# Patient Record
Sex: Male | Born: 1980 | Race: White | Hispanic: No | Marital: Single | Smoking: Current every day smoker
Health system: Southern US, Community
[De-identification: ages and names within clinical notes are randomized; demographics above are authoritative.]

## PROBLEM LIST (undated history)

## (undated) DIAGNOSIS — F32A Depression, unspecified: Secondary | ICD-10-CM

## (undated) DIAGNOSIS — N200 Calculus of kidney: Secondary | ICD-10-CM

## (undated) DIAGNOSIS — B029 Zoster without complications: Secondary | ICD-10-CM

## (undated) DIAGNOSIS — T7840XA Allergy, unspecified, initial encounter: Secondary | ICD-10-CM

## (undated) DIAGNOSIS — F17200 Nicotine dependence, unspecified, uncomplicated: Secondary | ICD-10-CM

## (undated) HISTORY — DX: Depression, unspecified: F32.A

## (undated) HISTORY — DX: Allergy, unspecified, initial encounter: T78.40XA

---

## 2005-03-12 ENCOUNTER — Emergency Department: Payer: Self-pay | Admitting: Emergency Medicine

## 2009-09-12 ENCOUNTER — Emergency Department: Payer: Self-pay | Admitting: Emergency Medicine

## 2011-03-20 ENCOUNTER — Emergency Department: Payer: Self-pay | Admitting: Emergency Medicine

## 2011-04-28 ENCOUNTER — Emergency Department: Payer: Self-pay | Admitting: Emergency Medicine

## 2014-02-27 ENCOUNTER — Emergency Department: Payer: Self-pay | Admitting: Emergency Medicine

## 2014-02-27 LAB — COMPREHENSIVE METABOLIC PANEL
ALBUMIN: 4.2 g/dL (ref 3.4–5.0)
ALT: 31 U/L (ref 12–78)
Alkaline Phosphatase: 64 U/L
Anion Gap: 7 (ref 7–16)
BUN: 13 mg/dL (ref 7–18)
Bilirubin,Total: 0.4 mg/dL (ref 0.2–1.0)
CHLORIDE: 108 mmol/L — AB (ref 98–107)
CO2: 26 mmol/L (ref 21–32)
Calcium, Total: 8.8 mg/dL (ref 8.5–10.1)
Creatinine: 0.74 mg/dL (ref 0.60–1.30)
GLUCOSE: 94 mg/dL (ref 65–99)
Osmolality: 281 (ref 275–301)
POTASSIUM: 4.1 mmol/L (ref 3.5–5.1)
SGOT(AST): 24 U/L (ref 15–37)
SODIUM: 141 mmol/L (ref 136–145)
Total Protein: 6.8 g/dL (ref 6.4–8.2)

## 2014-02-27 LAB — URINALYSIS, COMPLETE
Bilirubin,UR: NEGATIVE
Glucose,UR: NEGATIVE mg/dL (ref 0–75)
Ketone: NEGATIVE
Nitrite: NEGATIVE
Ph: 6 (ref 4.5–8.0)
RBC,UR: 3228 /HPF (ref 0–5)
SQUAMOUS EPITHELIAL: NONE SEEN
Specific Gravity: 1.03 (ref 1.003–1.030)
WBC UR: 4 /HPF (ref 0–5)

## 2014-02-27 LAB — CBC
HCT: 44 % (ref 40.0–52.0)
HGB: 15 g/dL (ref 13.0–18.0)
MCH: 30.6 pg (ref 26.0–34.0)
MCHC: 34.1 g/dL (ref 32.0–36.0)
MCV: 90 fL (ref 80–100)
PLATELETS: 191 10*3/uL (ref 150–440)
RBC: 4.9 10*6/uL (ref 4.40–5.90)
RDW: 12.8 % (ref 11.5–14.5)
WBC: 11.5 10*3/uL — ABNORMAL HIGH (ref 3.8–10.6)

## 2014-04-05 ENCOUNTER — Emergency Department: Payer: Self-pay | Admitting: Emergency Medicine

## 2014-04-05 LAB — COMPREHENSIVE METABOLIC PANEL
Albumin: 4.5 g/dL (ref 3.4–5.0)
Alkaline Phosphatase: 76 U/L
Anion Gap: 4 — ABNORMAL LOW (ref 7–16)
BILIRUBIN TOTAL: 0.5 mg/dL (ref 0.2–1.0)
BUN: 15 mg/dL (ref 7–18)
CALCIUM: 9.3 mg/dL (ref 8.5–10.1)
CO2: 30 mmol/L (ref 21–32)
Chloride: 105 mmol/L (ref 98–107)
Creatinine: 0.78 mg/dL (ref 0.60–1.30)
EGFR (African American): >60
Glucose: 163 mg/dL — ABNORMAL HIGH (ref 65–99)
Osmolality: 282 (ref 275–301)
POTASSIUM: 4.9 mmol/L (ref 3.5–5.1)
SGOT(AST): 12 U/L — ABNORMAL LOW (ref 15–37)
SGPT (ALT): 22 U/L (ref 12–78)
Sodium: 139 mmol/L (ref 136–145)
TOTAL PROTEIN: 7.5 g/dL (ref 6.4–8.2)

## 2014-04-05 LAB — URINALYSIS, COMPLETE
BILIRUBIN, UR: NEGATIVE
Bacteria: NONE SEEN
Glucose,UR: >=500 mg/dL (ref 0–75)
Ketone: NEGATIVE
LEUKOCYTE ESTERASE: NEGATIVE
NITRITE: NEGATIVE
Ph: 7 (ref 4.5–8.0)
Protein: NEGATIVE
RBC,UR: 1446 /HPF (ref 0–5)
SPECIFIC GRAVITY: 1.018 (ref 1.003–1.030)
Squamous Epithelial: NONE SEEN

## 2014-04-05 LAB — CBC
HCT: 50 % (ref 40.0–52.0)
HGB: 16.9 g/dL (ref 13.0–18.0)
MCH: 30.6 pg (ref 26.0–34.0)
MCHC: 33.9 g/dL (ref 32.0–36.0)
MCV: 90 fL (ref 80–100)
Platelet: 218 10*3/uL (ref 150–440)
RBC: 5.53 10*6/uL (ref 4.40–5.90)
RDW: 13.2 % (ref 11.5–14.5)
WBC: 11.2 10*3/uL — AB (ref 3.8–10.6)

## 2014-04-11 ENCOUNTER — Emergency Department: Payer: Self-pay | Admitting: Emergency Medicine

## 2014-04-11 LAB — COMPREHENSIVE METABOLIC PANEL
AST: 29 U/L (ref 15–37)
Albumin: 4.6 g/dL (ref 3.4–5.0)
Alkaline Phosphatase: 77 U/L
Anion Gap: 4 — ABNORMAL LOW (ref 7–16)
BUN: 13 mg/dL (ref 7–18)
Bilirubin,Total: 0.4 mg/dL (ref 0.2–1.0)
CHLORIDE: 107 mmol/L (ref 98–107)
CO2: 29 mmol/L (ref 21–32)
CREATININE: 1.02 mg/dL (ref 0.60–1.30)
Calcium, Total: 9.3 mg/dL (ref 8.5–10.1)
EGFR (African American): >60
EGFR (Non-African Amer.): >60
Glucose: 97 mg/dL (ref 65–99)
Osmolality: 279 (ref 275–301)
Potassium: 4 mmol/L (ref 3.5–5.1)
SGPT (ALT): 24 U/L (ref 12–78)
SODIUM: 140 mmol/L (ref 136–145)
Total Protein: 7.9 g/dL (ref 6.4–8.2)

## 2014-04-11 LAB — URINALYSIS, COMPLETE
BILIRUBIN, UR: NEGATIVE
Glucose,UR: NEGATIVE mg/dL (ref 0–75)
KETONE: NEGATIVE
LEUKOCYTE ESTERASE: NEGATIVE
Nitrite: NEGATIVE
PROTEIN: NEGATIVE
Ph: 7 (ref 4.5–8.0)
RBC,UR: 1623 /HPF (ref 0–5)
SPECIFIC GRAVITY: 1.015 (ref 1.003–1.030)
Squamous Epithelial: NONE SEEN

## 2014-04-11 LAB — CBC
HCT: 49.9 % (ref 40.0–52.0)
HGB: 16.3 g/dL (ref 13.0–18.0)
MCH: 29.6 pg (ref 26.0–34.0)
MCHC: 32.7 g/dL (ref 32.0–36.0)
MCV: 91 fL (ref 80–100)
PLATELETS: 236 10*3/uL (ref 150–440)
RBC: 5.51 10*6/uL (ref 4.40–5.90)
RDW: 13.2 % (ref 11.5–14.5)
WBC: 15 10*3/uL — ABNORMAL HIGH (ref 3.8–10.6)

## 2014-04-11 LAB — ACETAMINOPHEN LEVEL

## 2014-04-13 LAB — URINE CULTURE

## 2014-04-15 ENCOUNTER — Ambulatory Visit: Payer: Self-pay | Admitting: Urology

## 2014-04-15 DIAGNOSIS — N200 Calculus of kidney: Secondary | ICD-10-CM | POA: Insufficient documentation

## 2014-04-15 HISTORY — DX: Calculus of kidney: N20.0

## 2014-04-30 ENCOUNTER — Ambulatory Visit: Payer: Self-pay | Admitting: Urology

## 2014-05-05 ENCOUNTER — Ambulatory Visit: Payer: Self-pay | Admitting: Urology

## 2014-05-11 ENCOUNTER — Emergency Department: Payer: Self-pay | Admitting: Emergency Medicine

## 2014-05-11 LAB — CBC WITH DIFFERENTIAL/PLATELET
Basophil #: 0.1 10*3/uL (ref 0.0–0.1)
Basophil %: 0.5 %
EOS ABS: 0.1 10*3/uL (ref 0.0–0.7)
Eosinophil %: 0.6 %
HCT: 45.2 % (ref 40.0–52.0)
HGB: 15.8 g/dL (ref 13.0–18.0)
Lymphocyte #: 1.8 10*3/uL (ref 1.0–3.6)
Lymphocyte %: 15.2 %
MCH: 31.1 pg (ref 26.0–34.0)
MCHC: 34.9 g/dL (ref 32.0–36.0)
MCV: 89 fL (ref 80–100)
MONO ABS: 0.4 x10 3/mm (ref 0.2–1.0)
MONOS PCT: 3.6 %
Neutrophil #: 9.7 10*3/uL — ABNORMAL HIGH (ref 1.4–6.5)
Neutrophil %: 80.1 %
PLATELETS: 224 10*3/uL (ref 150–440)
RBC: 5.07 10*6/uL (ref 4.40–5.90)
RDW: 13.1 % (ref 11.5–14.5)
WBC: 12.1 10*3/uL — ABNORMAL HIGH (ref 3.8–10.6)

## 2014-05-11 LAB — URINALYSIS, COMPLETE
BACTERIA: NONE SEEN
BLOOD: NEGATIVE
Bilirubin,UR: NEGATIVE
Glucose,UR: 50 mg/dL (ref 0–75)
Ketone: NEGATIVE
LEUKOCYTE ESTERASE: NEGATIVE
NITRITE: NEGATIVE
PROTEIN: NEGATIVE
Ph: 6 (ref 4.5–8.0)
RBC,UR: 6 /HPF (ref 0–5)
Specific Gravity: 1.018 (ref 1.003–1.030)
Squamous Epithelial: <1
WBC UR: 2 /HPF (ref 0–5)

## 2014-05-11 LAB — COMPREHENSIVE METABOLIC PANEL
ALK PHOS: 68 U/L
Albumin: 4 g/dL (ref 3.4–5.0)
Anion Gap: 8 (ref 7–16)
BILIRUBIN TOTAL: 0.4 mg/dL (ref 0.2–1.0)
BUN: 11 mg/dL (ref 7–18)
CO2: 26 mmol/L (ref 21–32)
Calcium, Total: 8.7 mg/dL (ref 8.5–10.1)
Chloride: 106 mmol/L (ref 98–107)
Creatinine: 1 mg/dL (ref 0.60–1.30)
EGFR (African American): >60
EGFR (Non-African Amer.): >60
GLUCOSE: 144 mg/dL — AB (ref 65–99)
Osmolality: 281 (ref 275–301)
Potassium: 3.9 mmol/L (ref 3.5–5.1)
SGOT(AST): 21 U/L (ref 15–37)
SGPT (ALT): 22 U/L (ref 12–78)
Sodium: 140 mmol/L (ref 136–145)
TOTAL PROTEIN: 7.2 g/dL (ref 6.4–8.2)

## 2014-05-11 LAB — LIPASE, BLOOD: LIPASE: 85 U/L (ref 73–393)

## 2014-05-11 LAB — TROPONIN I: Troponin-I: <0.02 ng/mL

## 2014-05-20 ENCOUNTER — Ambulatory Visit: Payer: Self-pay | Admitting: Physician Assistant

## 2014-05-28 ENCOUNTER — Ambulatory Visit: Payer: Self-pay | Admitting: Physician Assistant

## 2015-03-07 IMAGING — CR DG ABDOMEN 1V
1 series · 1 of 1 positions shown · non-contrast
Comparison: 04/11/2014.

CLINICAL DATA: History of kidney stones on the right side. Renal
colic.

EXAM:
ABDOMEN - 1 VIEW

[t abdomen supine]
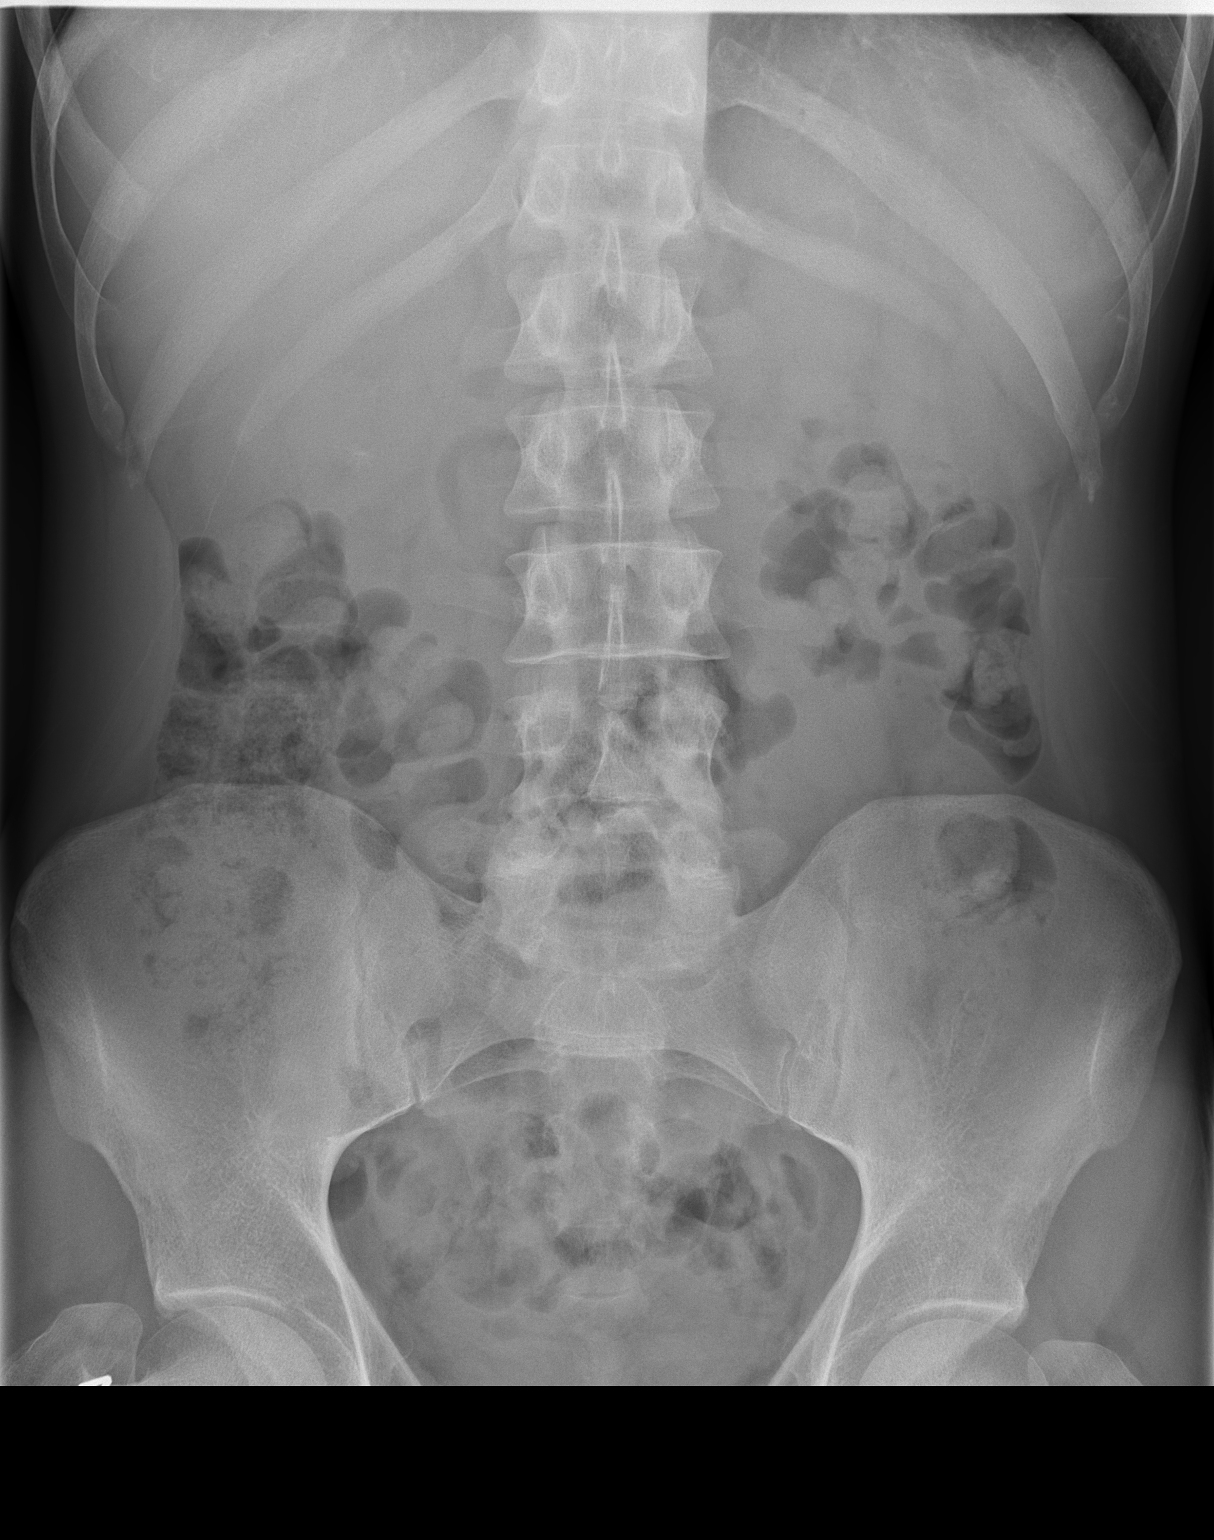

[1 of 1 positions shown; findings below may reference images not displayed]

FINDINGS: Faint calcification projects over the lower pole right renal
outline. Left renal outline is somewhat obscured by bowel contents.
No definite radiopaque calculi along the expected course of the
ureters. Probable phlebolith in the left anatomic pelvis. Fair
amount of stool in the colon.
IMPRESSION: 1. Right renal stone.
2. Bowel gas pattern suggests at least mild constipation.

## 2015-03-22 IMAGING — CR DG ABDOMEN 1V
1 series · 1 of 1 positions shown · non-contrast
Comparison: KUB of 04/15/2014 and CT abdomen pelvis of 02/27/2014

CLINICAL DATA: History of kidney stones, for lithotripsy

EXAM:
ABDOMEN - 1 VIEW

[t abdomen supine]
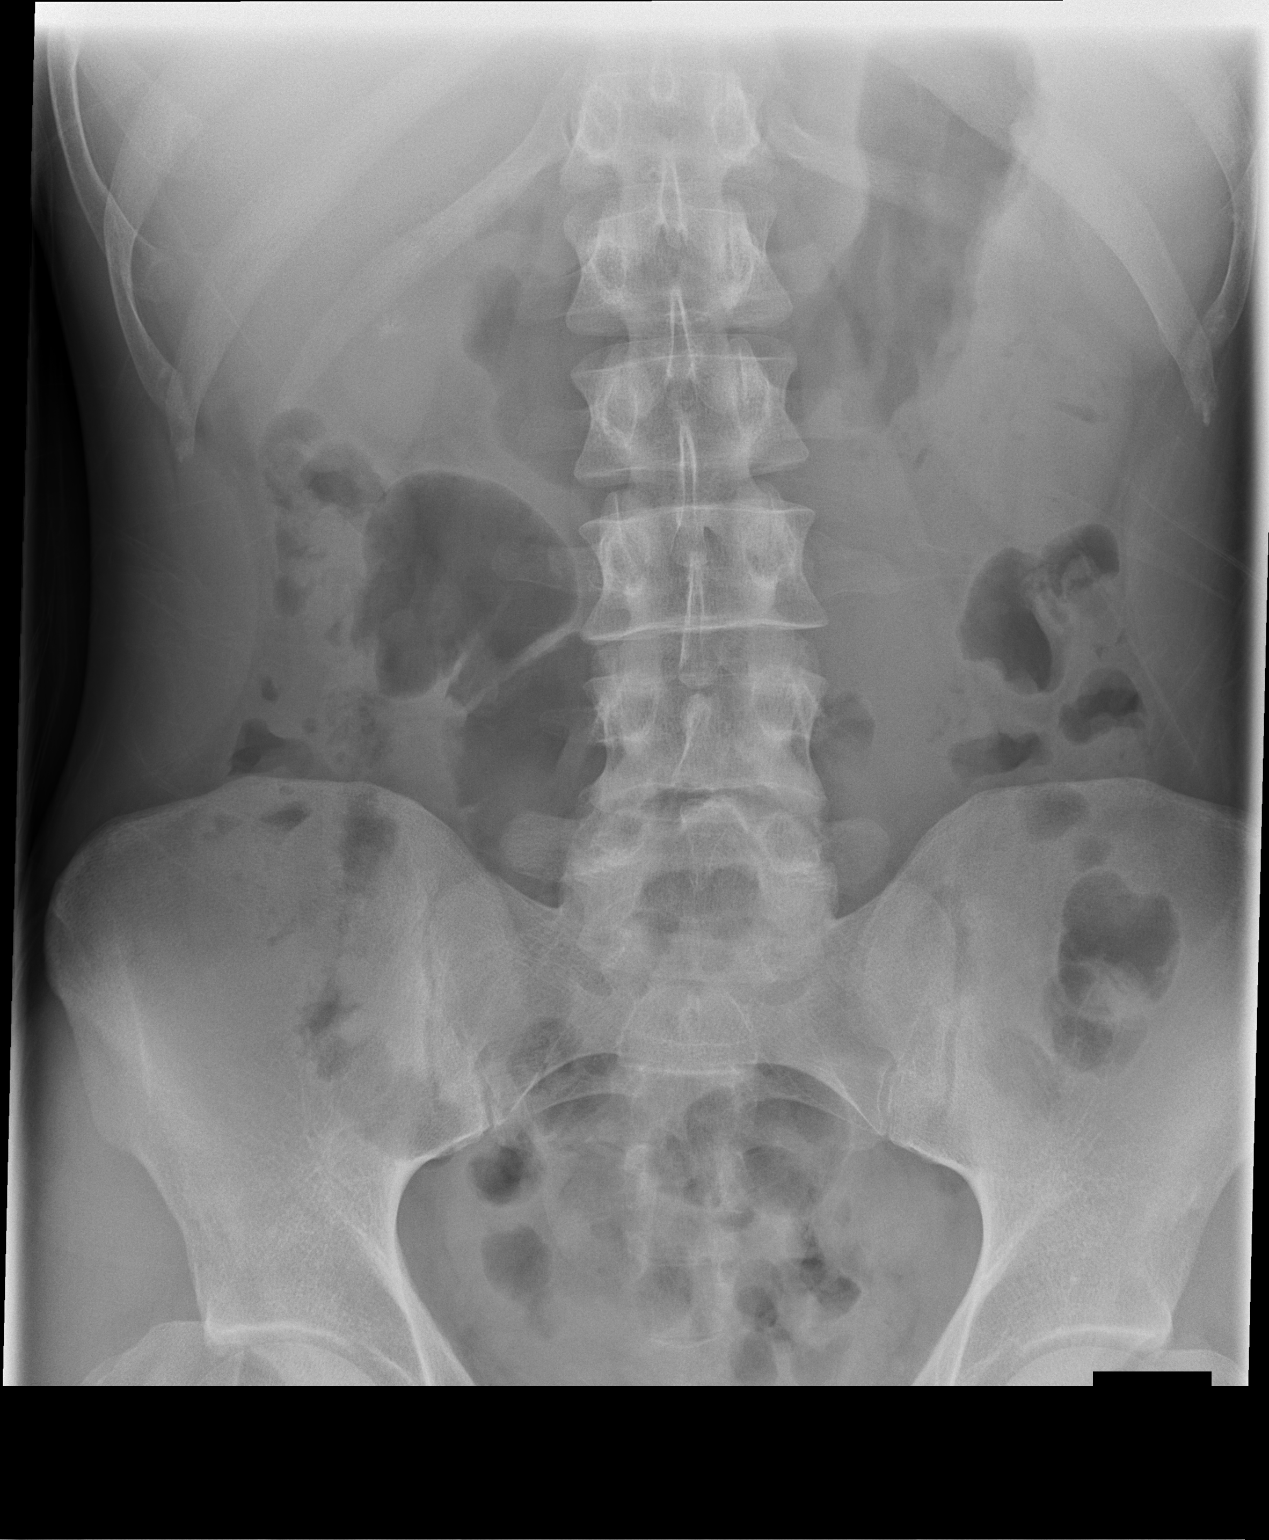

[1 of 1 positions shown; findings below may reference images not displayed]

FINDINGS: An 8 mm calcification overlies the right mid kidney most consistent
with the previously demonstrated right renal pelvic calculus. No
other definite calculi are seen overlying the renal outlines or the
expected courses of the ureters. The bowel gas pattern is
nonspecific.
IMPRESSION: 8 mm calcification overlies the expected right renal pelvis
consistent with right renal calculus.

## 2015-03-27 IMAGING — CR DG ABDOMEN 1V
1 series · 2 of 2 positions shown · non-contrast
Comparison: 04/30/2014

CLINICAL DATA: Renal colic and nephrolithiasis.

EXAM:
ABDOMEN - 1 VIEW

[Series 2: t abdomen supine · 0.14mm/px · 2 of 2 slices shown]
[im 1/2]
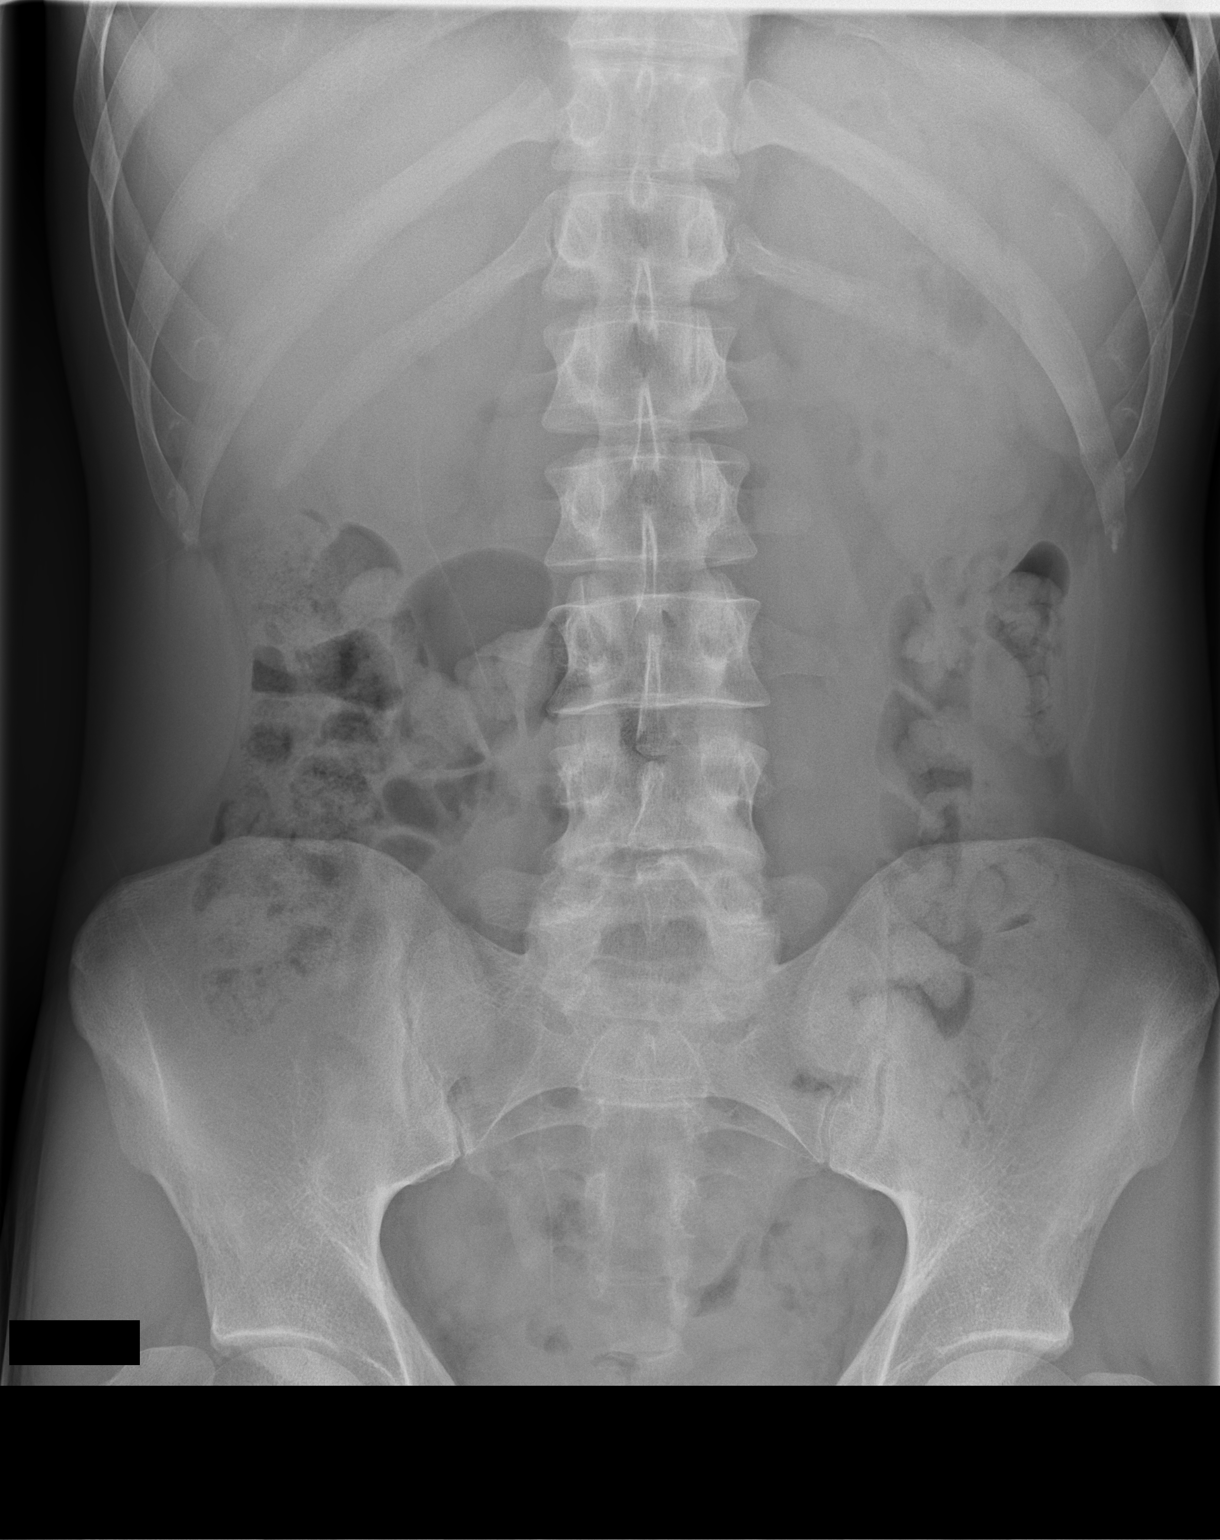
[im 2/2]
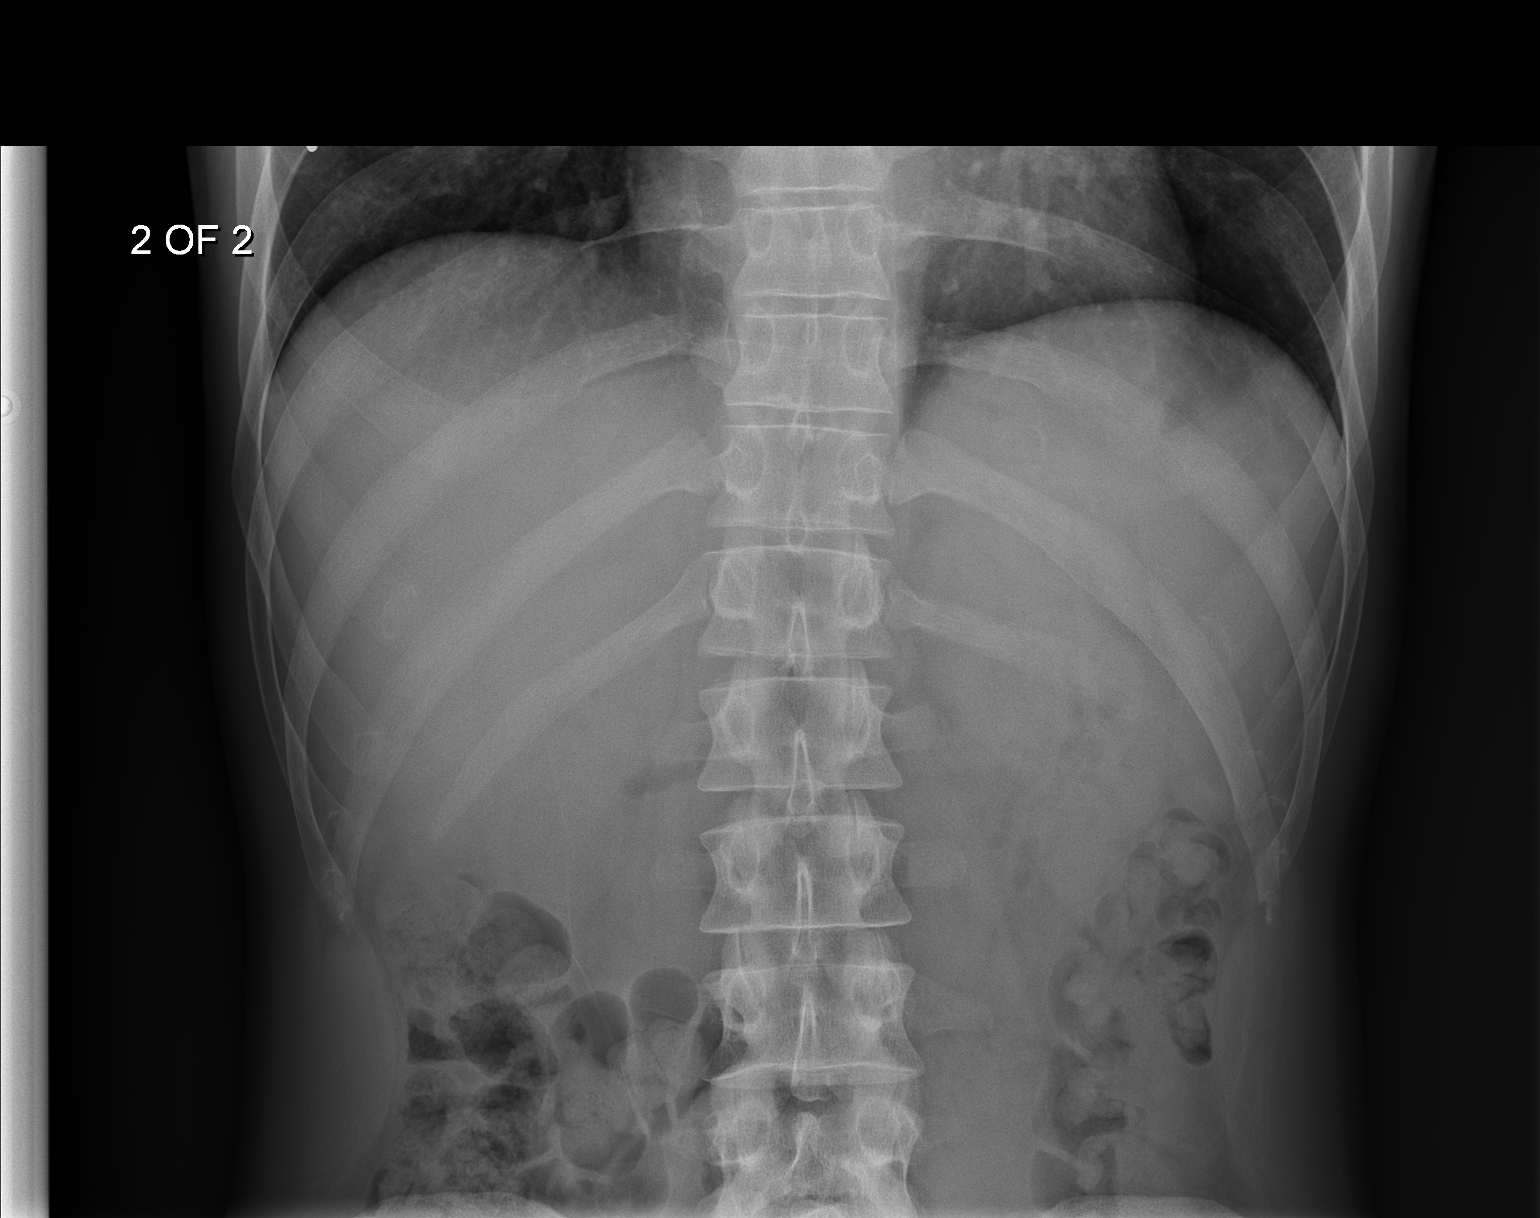

[2 of 2 positions shown; findings below may reference images not displayed]

FINDINGS: Linear densities overlying the right side of the abdomen and the
right side of the pelvis are probably outside of the patient. No
definite stones in the region of the kidney shadows or expected
course of the ureters. Stable phlebolith in the left hemipelvis.
Nonobstructive bowel gas pattern. Moderate amount of stool in the
abdomen.
IMPRESSION: Kidney stones are not identified on this examination.

Nonspecific bowel gas pattern.

## 2021-11-06 DIAGNOSIS — J189 Pneumonia, unspecified organism: Secondary | ICD-10-CM

## 2021-11-06 HISTORY — DX: Pneumonia, unspecified organism: J18.9

## 2022-08-30 ENCOUNTER — Emergency Department: Payer: BLUE CROSS/BLUE SHIELD

## 2022-08-30 ENCOUNTER — Other Ambulatory Visit: Payer: Self-pay

## 2022-08-30 ENCOUNTER — Inpatient Hospital Stay: Payer: BLUE CROSS/BLUE SHIELD

## 2022-08-30 ENCOUNTER — Encounter: Payer: Self-pay | Admitting: Internal Medicine

## 2022-08-30 ENCOUNTER — Inpatient Hospital Stay
Admission: EM | Admit: 2022-08-30 | Discharge: 2022-09-02 | DRG: 194 | Disposition: A | Discharge status: Other Acute Inpatient Hospital | Payer: BLUE CROSS/BLUE SHIELD | Attending: Internal Medicine | Admitting: Internal Medicine

## 2022-08-30 ENCOUNTER — Observation Stay: Payer: BLUE CROSS/BLUE SHIELD

## 2022-08-30 DIAGNOSIS — J918 Pleural effusion in other conditions classified elsewhere: Secondary | ICD-10-CM | POA: Diagnosis present

## 2022-08-30 DIAGNOSIS — R7989 Other specified abnormal findings of blood chemistry: Secondary | ICD-10-CM | POA: Diagnosis present

## 2022-08-30 DIAGNOSIS — R000 Tachycardia, unspecified: Secondary | ICD-10-CM | POA: Diagnosis present

## 2022-08-30 DIAGNOSIS — A419 Sepsis, unspecified organism: Principal | ICD-10-CM

## 2022-08-30 DIAGNOSIS — J439 Emphysema, unspecified: Secondary | ICD-10-CM | POA: Diagnosis present

## 2022-08-30 DIAGNOSIS — D649 Anemia, unspecified: Secondary | ICD-10-CM | POA: Diagnosis not present

## 2022-08-30 DIAGNOSIS — Z79899 Other long term (current) drug therapy: Secondary | ICD-10-CM | POA: Diagnosis not present

## 2022-08-30 DIAGNOSIS — J90 Pleural effusion, not elsewhere classified: Secondary | ICD-10-CM | POA: Diagnosis not present

## 2022-08-30 DIAGNOSIS — J181 Lobar pneumonia, unspecified organism: Secondary | ICD-10-CM | POA: Diagnosis present

## 2022-08-30 DIAGNOSIS — J189 Pneumonia, unspecified organism: Secondary | ICD-10-CM

## 2022-08-30 DIAGNOSIS — F17210 Nicotine dependence, cigarettes, uncomplicated: Secondary | ICD-10-CM | POA: Diagnosis present

## 2022-08-30 DIAGNOSIS — E872 Acidosis, unspecified: Secondary | ICD-10-CM | POA: Diagnosis present

## 2022-08-30 DIAGNOSIS — R7401 Elevation of levels of liver transaminase levels: Secondary | ICD-10-CM | POA: Diagnosis present

## 2022-08-30 DIAGNOSIS — Z720 Tobacco use: Secondary | ICD-10-CM | POA: Diagnosis not present

## 2022-08-30 DIAGNOSIS — R739 Hyperglycemia, unspecified: Secondary | ICD-10-CM | POA: Diagnosis present

## 2022-08-30 DIAGNOSIS — Z1152 Encounter for screening for COVID-19: Secondary | ICD-10-CM | POA: Diagnosis not present

## 2022-08-30 DIAGNOSIS — E871 Hypo-osmolality and hyponatremia: Secondary | ICD-10-CM | POA: Diagnosis not present

## 2022-08-30 DIAGNOSIS — J9601 Acute respiratory failure with hypoxia: Secondary | ICD-10-CM | POA: Diagnosis not present

## 2022-08-30 DIAGNOSIS — J869 Pyothorax without fistula: Secondary | ICD-10-CM | POA: Diagnosis not present

## 2022-08-30 HISTORY — DX: Zoster without complications: B02.9

## 2022-08-30 HISTORY — DX: Pneumonia, unspecified organism: J18.9

## 2022-08-30 HISTORY — DX: Calculus of kidney: N20.0

## 2022-08-30 HISTORY — DX: Emphysema, unspecified: J43.9

## 2022-08-30 HISTORY — DX: Nicotine dependence, unspecified, uncomplicated: F17.200

## 2022-08-30 LAB — BLOOD GAS, ARTERIAL
Acid-base deficit: 0.1 mmol/L (ref 0.0–2.0)
Bicarbonate: 23.9 mmol/L (ref 20.0–28.0)
O2 Content: 3 L/min
O2 Saturation: 97.3 %
Patient temperature: 37
pCO2 arterial: 36 mmHg (ref 32–48)
pH, Arterial: 7.43 (ref 7.35–7.45)
pO2, Arterial: 69 mmHg — ABNORMAL LOW (ref 83–108)

## 2022-08-30 LAB — LACTIC ACID, PLASMA
Lactic Acid, Venous: 2 mmol/L (ref 0.5–1.9)
Lactic Acid, Venous: 2.2 mmol/L (ref 0.5–1.9)

## 2022-08-30 LAB — CBC WITH DIFFERENTIAL/PLATELET
Abs Immature Granulocytes: 0.2 10*3/uL — ABNORMAL HIGH (ref 0.00–0.07)
Basophils Absolute: 0.1 10*3/uL (ref 0.0–0.1)
Basophils Relative: 0 %
Eosinophils Absolute: 0 10*3/uL (ref 0.0–0.5)
Eosinophils Relative: 0 %
HCT: 47.7 % (ref 39.0–52.0)
Hemoglobin: 16.2 g/dL (ref 13.0–17.0)
Immature Granulocytes: 1 %
Lymphocytes Relative: 3 %
Lymphs Abs: 0.8 10*3/uL (ref 0.7–4.0)
MCH: 29.7 pg (ref 26.0–34.0)
MCHC: 34 g/dL (ref 30.0–36.0)
MCV: 87.4 fL (ref 80.0–100.0)
Monocytes Absolute: 1.4 10*3/uL — ABNORMAL HIGH (ref 0.1–1.0)
Monocytes Relative: 6 %
Neutro Abs: 23 10*3/uL — ABNORMAL HIGH (ref 1.7–7.7)
Neutrophils Relative %: 90 %
Platelets: 362 10*3/uL (ref 150–400)
RBC: 5.46 MIL/uL (ref 4.22–5.81)
RDW: 13 % (ref 11.5–15.5)
Smear Review: NORMAL
WBC: 25.5 10*3/uL — ABNORMAL HIGH (ref 4.0–10.5)
nRBC: 0 % (ref 0.0–0.2)

## 2022-08-30 LAB — COMPREHENSIVE METABOLIC PANEL
ALT: 82 U/L — ABNORMAL HIGH (ref 0–44)
AST: 38 U/L (ref 15–41)
Albumin: 4.3 g/dL (ref 3.5–5.0)
Alkaline Phosphatase: 104 U/L (ref 38–126)
Anion gap: 12 (ref 5–15)
BUN: 13 mg/dL (ref 6–20)
CO2: 20 mmol/L — ABNORMAL LOW (ref 22–32)
Calcium: 9.5 mg/dL (ref 8.9–10.3)
Chloride: 103 mmol/L (ref 98–111)
Creatinine, Ser: 0.82 mg/dL (ref 0.61–1.24)
GFR, Estimated: >60 mL/min (ref >60)
Glucose, Bld: 150 mg/dL — ABNORMAL HIGH (ref 70–99)
Potassium: 4.7 mmol/L (ref 3.5–5.1)
Sodium: 135 mmol/L (ref 135–145)
Total Bilirubin: 1.9 mg/dL — ABNORMAL HIGH (ref 0.3–1.2)
Total Protein: 8.6 g/dL — ABNORMAL HIGH (ref 6.5–8.1)

## 2022-08-30 LAB — TROPONIN I (HIGH SENSITIVITY)
Troponin I (High Sensitivity): 4 ng/L (ref <18)
Troponin I (High Sensitivity): 3 ng/L (ref <18)

## 2022-08-30 LAB — SARS CORONAVIRUS 2 BY RT PCR: SARS Coronavirus 2 by RT PCR: NEGATIVE

## 2022-08-30 MED ORDER — METHOCARBAMOL 1000 MG/10ML IJ SOLN
1000.0000 mg | Freq: Three times a day (TID) | INTRAVENOUS | Status: DC | PRN
  Administered 2022-08-31: 1000 mg via INTRAVENOUS
  Filled 2022-08-30 (×2): qty 10

## 2022-08-30 MED ORDER — MORPHINE SULFATE (PF) 4 MG/ML IV SOLN
4.0000 mg | Freq: Once | INTRAVENOUS | Status: AC
  Administered 2022-08-30: 4 mg via INTRAVENOUS
  Filled 2022-08-30: qty 1

## 2022-08-30 MED ORDER — NICOTINE 14 MG/24HR TD PT24
14.0000 mg | MEDICATED_PATCH | Freq: Every day | TRANSDERMAL | Status: DC | PRN
  Filled 2022-08-30: qty 1

## 2022-08-30 MED ORDER — FENTANYL CITRATE PF 50 MCG/ML IJ SOSY
PREFILLED_SYRINGE | INTRAMUSCULAR | Status: AC
  Administered 2022-08-30: 50 ug via INTRAVENOUS
  Filled 2022-08-30: qty 1

## 2022-08-30 MED ORDER — HYDROMORPHONE HCL 1 MG/ML IJ SOLN
1.0000 mg | INTRAMUSCULAR | Status: DC | PRN
  Administered 2022-08-30: 1 mg via INTRAVENOUS
  Filled 2022-08-30 (×2): qty 1

## 2022-08-30 MED ORDER — LIDOCAINE-EPINEPHRINE 2 %-1:100000 IJ SOLN
30.0000 mL | Freq: Once | INTRAMUSCULAR | Status: AC
  Administered 2022-08-30: 30 mL via INTRADERMAL
  Filled 2022-08-30: qty 2

## 2022-08-30 MED ORDER — HYDROMORPHONE HCL 1 MG/ML IJ SOLN
1.0000 mg | Freq: Once | INTRAMUSCULAR | Status: AC
  Administered 2022-08-30: 1 mg via INTRAVENOUS
  Filled 2022-08-30: qty 1

## 2022-08-30 MED ORDER — LORAZEPAM 2 MG/ML IJ SOLN
1.0000 mg | Freq: Once | INTRAMUSCULAR | Status: AC
  Administered 2022-08-30: 1 mg via INTRAVENOUS

## 2022-08-30 MED ORDER — ONDANSETRON HCL 4 MG/2ML IJ SOLN
4.0000 mg | Freq: Once | INTRAMUSCULAR | Status: AC
  Administered 2022-08-30: 4 mg via INTRAVENOUS
  Filled 2022-08-30: qty 2

## 2022-08-30 MED ORDER — HYDROMORPHONE HCL 1 MG/ML IJ SOLN
1.0000 mg | Freq: Once | INTRAMUSCULAR | Status: AC
  Administered 2022-08-30: 1 mg via INTRAVENOUS

## 2022-08-30 MED ORDER — METHOCARBAMOL 1000 MG/10ML IJ SOLN
1000.0000 mg | Freq: Once | INTRAVENOUS | Status: AC
  Administered 2022-08-30: 1000 mg via INTRAVENOUS
  Filled 2022-08-30 (×2): qty 10

## 2022-08-30 MED ORDER — ONDANSETRON HCL 4 MG/2ML IJ SOLN
4.0000 mg | Freq: Three times a day (TID) | INTRAMUSCULAR | Status: DC | PRN
  Administered 2022-08-31: 4 mg via INTRAVENOUS
  Filled 2022-08-30: qty 2

## 2022-08-30 MED ORDER — LIDOCAINE HCL (PF) 1 % IJ SOLN
30.0000 mL | Freq: Once | INTRAMUSCULAR | Status: DC
  Filled 2022-08-30: qty 30

## 2022-08-30 MED ORDER — VANCOMYCIN HCL IN DEXTROSE 1-5 GM/200ML-% IV SOLN
1000.0000 mg | Freq: Once | INTRAVENOUS | Status: AC
  Administered 2022-08-30: 1000 mg via INTRAVENOUS
  Filled 2022-08-30: qty 200

## 2022-08-30 MED ORDER — METRONIDAZOLE 500 MG/100ML IV SOLN
500.0000 mg | Freq: Once | INTRAVENOUS | Status: AC
  Administered 2022-08-30: 500 mg via INTRAVENOUS
  Filled 2022-08-30: qty 100

## 2022-08-30 MED ORDER — LACTATED RINGERS IV BOLUS
1000.0000 mL | Freq: Once | INTRAVENOUS | Status: AC
  Administered 2022-08-30: 1000 mL via INTRAVENOUS

## 2022-08-30 MED ORDER — IOHEXOL 350 MG/ML SOLN: 75.0000 mL | Freq: Once | INTRAVENOUS | Status: DC | PRN

## 2022-08-30 MED ORDER — FENTANYL CITRATE PF 50 MCG/ML IJ SOSY: 50.0000 ug | PREFILLED_SYRINGE | INTRAMUSCULAR | Status: AC

## 2022-08-30 MED ORDER — LORAZEPAM 2 MG/ML IJ SOLN: 1.0000 mg | Freq: Once | INTRAMUSCULAR | Status: DC

## 2022-08-30 MED ORDER — FENTANYL CITRATE PF 50 MCG/ML IJ SOSY
25.0000 ug | PREFILLED_SYRINGE | Freq: Once | INTRAMUSCULAR | Status: AC
  Administered 2022-08-30: 25 ug via INTRAVENOUS
  Filled 2022-08-30: qty 1

## 2022-08-30 MED ORDER — LORAZEPAM 2 MG/ML IJ SOLN
1.0000 mg | Freq: Once | INTRAMUSCULAR | Status: AC
  Administered 2022-08-30: 1 mg via INTRAVENOUS
  Filled 2022-08-30: qty 1

## 2022-08-30 MED ORDER — IOHEXOL 300 MG/ML  SOLN
75.0000 mL | Freq: Once | INTRAMUSCULAR | Status: AC | PRN
  Administered 2022-08-30: 75 mL via INTRAVENOUS

## 2022-08-30 MED ORDER — FENTANYL CITRATE PF 50 MCG/ML IJ SOSY
25.0000 ug | PREFILLED_SYRINGE | Freq: Once | INTRAMUSCULAR | Status: AC
  Administered 2022-08-30: 25 ug via INTRAVENOUS

## 2022-08-30 MED ORDER — SODIUM CHLORIDE 0.9 % IV SOLN
2.0000 g | Freq: Once | INTRAVENOUS | Status: AC
  Administered 2022-08-30: 2 g via INTRAVENOUS
  Filled 2022-08-30: qty 12.5

## 2022-08-30 MED ORDER — METRONIDAZOLE 500 MG/100ML IV SOLN: 500.0000 mg | Freq: Two times a day (BID) | INTRAVENOUS | Status: DC

## 2022-08-30 MED ORDER — SODIUM CHLORIDE 0.9 % IV SOLN: 2.0000 g | INTRAVENOUS | Status: DC

## 2022-08-30 MED ORDER — LORAZEPAM 2 MG/ML IJ SOLN
INTRAMUSCULAR | Status: AC
  Filled 2022-08-30: qty 1

## 2022-08-30 MED ORDER — LACTATED RINGERS IV SOLN: INTRAVENOUS | Status: AC

## 2022-08-30 MED ORDER — FENTANYL CITRATE PF 50 MCG/ML IJ SOSY
PREFILLED_SYRINGE | INTRAMUSCULAR | Status: AC
  Filled 2022-08-30: qty 1

## 2022-08-30 MED ORDER — FENTANYL CITRATE PF 50 MCG/ML IJ SOSY
50.0000 ug | PREFILLED_SYRINGE | INTRAMUSCULAR | Status: DC | PRN
  Administered 2022-08-30 – 2022-09-02 (×24): 50 ug via INTRAVENOUS
  Filled 2022-08-30 (×25): qty 1

## 2022-08-30 NOTE — ED Notes (Signed)
Pt transported to and from CT escorted by RN and intensivist team

## 2022-08-30 NOTE — H&P (Addendum)
History and Physical    Patient: Carl Le TOI:[id number] DOB: 07/22/1981 DOA: 08/30/2022 DOS: the patient was seen and examined on 08/30/2022 PCP: Pcp, No  Patient coming from: Home  Chief Complaint:  Chief Complaint  Patient presents with   Shortness of Breath    Chest pain and SOB    HPI: Carl Le is a 41 y.o. male with medical history significant of nephrolithiasis who presents to the ED with complaints of chest pain and shortness of breath.  Mr. Carl Le states he has been experiencing right-sided chest pain and abdominal pain for approximately 1 week.  In addition, he endorses nausea but denies any fever, chills. Due to this, he went to the [facility] ED on 10/22.  Work-up included a chest x-ray, right upper quadrant ultrasound, CT abdomen/pelvis, and CTA chest.  He was found to have evidence of a right-sided pleural effusion and surrounding opacity.  He was discharged home with cough medicine only.  On 10/23, he began to develop shortness of breath and cough and he went back to the ED.  At this time, he was discharged home with Augmentin and doxycycline.  He has been taking his antibiotics as prescribed, however his right-sided chest pain has progressively worsened.  He is experiencing difficulty taking deep breaths as it exacerbates his pain.  He denies any fever, chills, vomiting, diarrhea, urinary or bowel changes.  ED course: On arrival to the ED, patient was tachycardic up to 133 with a blood pressure of 107/65.  He was saturating at 96% on room air. Work-up remarkable for white blood count of 25.5 with lactic acid of 2.  ALT was noted to be elevated at 82 with total bilirubin of 1.9.  Chest x-ray was obtained that demonstrated right lower lobe opacity with pleural effusion.  TRH contacted for admission for community-acquired pneumonia with parapneumonic effusion.  Review of Systems: As mentioned in the history of present illness. All other systems reviewed and are  negative.  History reviewed. No pertinent past medical history. History reviewed. No pertinent surgical history.  Social History:  reports that he has been smoking cigarettes. He has been smoking an average of 1 pack per day. He does not have any smokeless tobacco history on file. He reports current alcohol use. 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  Not on File  Family History  Problem Relation Age of Onset   Heart attack Father    Heart attack Brother     Prior to Admission medications   Medication Sig Start Date End Date Taking? Authorizing Provider  amoxicillin-clavulanate (AUGMENTIN) 875-125 MG tablet Take 1 tablet by mouth 2 (two) times daily. 08/27/22  Yes [provider]  doxycycline (VIBRAMYCIN) 100 MG capsule Take 100 mg by mouth 2 (two) times daily. 08/27/22  Yes [provider]  HYDROcodone bit-homatropine (HYCODAN) 5-1.5 MG/5ML syrup Take 5 mLs by mouth every 6 (six) hours as needed. 08/27/22  Yes [provider]    Physical Exam: Vitals:   08/30/22 2100 08/30/22 2110 08/30/22 2120 08/30/22 2125  BP:      Pulse: (!) 116 (!) 131 (!) 127 (!) 122  Resp: (!) 32 (!) 50 (!) 25 (!) 35  Temp:      TempSrc:      SpO2: 95% 94% 95% 95%  Weight:      Height:       Physical Exam Vitals and nursing note reviewed.  Constitutional:  General: He is in acute distress (Secondary to pain).  HENT:     Head: Normocephalic and atraumatic.  Eyes:     Extraocular Movements: Extraocular movements intact.     Pupils: Pupils are equal, round, and reactive to light.  Cardiovascular:     Rate and Rhythm: Regular rhythm. Tachycardia present.     Heart sounds: No murmur heard.    No gallop.  Pulmonary:     Effort: Tachypnea present.     Breath sounds: No stridor. Examination of the right-middle field reveals decreased breath sounds. Examination of the right-lower field reveals decreased breath sounds  and rales. Decreased breath sounds and rales present. No wheezing or rhonchi.  Chest:     Chest wall: No deformity, tenderness, crepitus or edema.  Abdominal:     Palpations: Abdomen is soft.     Tenderness: There is no abdominal tenderness. There is no guarding.  Musculoskeletal:     Cervical back: Neck supple.  Skin:    General: Skin is warm and dry.  Neurological:     General: No focal deficit present.     Mental Status: He is alert and oriented to person, place, and time.  Psychiatric:        Mood and Affect: Mood is anxious.        Behavior: Behavior normal.    Data Reviewed: CBC remarkable for leukocytosis at 25.5 with left shift.  CMP remarkable for CO2 of 20, glucose of 150, total protein of 8.6, ALT of 82, total bili of 1.9. Troponin negative x2.  Lactic acid elevated at 2.0.  COVID-19 PCR negative.  EKG personally reviewed.  Significant motion artifact.  Sinus rhythm with tachycardia noted.  DG Chest Portable 1 View  Result Date: 08/30/2022 CLINICAL DATA:  Chest pain due to spasms and worsening pneumonia, on antibiotics since Sunday with no relief, pain rated at 10/10 EXAM: PORTABLE CHEST 1 VIEW COMPARISON:  05/11/2014 FINDINGS: Normal heart size and mediastinal contours. Decreased lung volumes versus previous exam. RIGHT basilar infiltrate and associated pleural effusion. Remaining lungs clear. No pneumothorax or acute osseous findings. IMPRESSION: RIGHT basilar infiltrate consistent with pneumonia. Associated RIGHT parapneumonic pleural effusion. Electronically Signed   By: Ulyses Southward M.D.   On: 08/30/2022 16:22    Results are pending, will review when available.  Assessment and Plan: * CAP (community acquired pneumonia) CTA chest obtained on 10/22 demonstrated small right pleural effusion.  Patient presenting today with worsening symptoms with chest x-ray demonstrating enlarging pleural effusion consistent with CAP complicated by either para-pneumonic effusion or  empyema. Will need to obtain a CT to better characterize.   - Continue ceftriaxone - Start metronidazole for anaerobic coverage - MRSA nasal PCR to determine need for continued vancomycin - CT chest without contrast ordered and pending - Ultrasound-guided thoracentesis via IR ordered - Pleural studies ordered including body fluid cell count, Gram stain, culture, pH, protein, LDH - Dilaudid and Robaxin for pain control - Strep pneumo and Legionella urinary antigen   Sinus tachycardia Likely in the setting of uncontrolled pain.  Although patient is certainly at risk for sepsis, he is not currently septic.  - Telemetry monitoring - Dilaudid and Robaxin for pain control  LFT elevation ALT and total bilirubin elevated on admission.  Recent right upper quadrant ultrasound  and CT abdomen/pelvis were within normal limits.  Etiology undetermined at this time.   -Repeat CMP in the a.m.  Hyperglycemia In the setting of acute illness  - A1c in the a.m. -  No indication for SSI at this time   Advance Care Planning:   Code Status: Full Code   Consults: IR  Family Communication: Patient's sister updated at bedside  Severity of Illness: The appropriate patient status for this patient is OBSERVATION. Observation status is judged to be reasonable and necessary in order to provide the required intensity of service to ensure the patient's safety. The patient's presenting symptoms, physical exam findings, and initial radiographic and laboratory data in the context of their medical condition is felt to place them at decreased risk for further clinical deterioration. Furthermore, it is anticipated that the patient will be medically stable for discharge from the hospital within 2 midnights of admission.   Author: Verdene Lennert, MD 08/30/2022 9:36 PM  For on call review [email or web address].

## 2022-08-30 NOTE — Assessment & Plan Note (Addendum)
CTA chest obtained on 10/22 demonstrated small right pleural effusion.  Patient presenting today with worsening symptoms with chest x-ray demonstrating enlarging pleural effusion consistent with CAP complicated by either para-pneumonic effusion or empyema. Will need to obtain a CT to better characterize.   - Continue ceftriaxone - Start metronidazole for anaerobic coverage - MRSA nasal PCR to determine need for continued vancomycin - CT chest without contrast ordered and pending - Ultrasound-guided thoracentesis via IR ordered - Pleural studies ordered including body fluid cell count, Gram stain, culture, pH, protein, LDH - Dilaudid and Robaxin for pain control - Strep pneumo and Legionella urinary antigen

## 2022-08-30 NOTE — Assessment & Plan Note (Signed)
In the setting of acute illness  - A1c in the a.m. - No indication for SSI at this time

## 2022-08-30 NOTE — Progress Notes (Signed)
CODE SEPSIS - PHARMACY COMMUNICATION  **Broad Spectrum Antibiotics should be administered within 1 hour of Sepsis diagnosis**  Time Code Sepsis Called/Page Received: 1656  Antibiotics Ordered: vancomycin 1,000 mg x 1, cefepime 2 grams x 1  Time of 1st antibiotic administration: 4481  Additional action taken by pharmacy: none  If necessary, Name of Provider/Nurse Contacted: [place] ,PharmD Clinical Pharmacist  08/30/2022  5:05 PM

## 2022-08-30 NOTE — ED Notes (Signed)
Pt standing at bedside holding onto IV pole and bed. Pt unable to tolerate sitting or laying and is observed to be holding arms above head. Pt reports utilizing this position in effort to ease spasm occurring in R chest wall/side. Pt denies relief with medication given on prior shift. Pt noted to be alert and oriented. Pt is steady on feet and denies dizziness at this time. Pt following commands appropriately. Pt also has shoes with rubber soles on feet.

## 2022-08-30 NOTE — Assessment & Plan Note (Signed)
Likely in the setting of uncontrolled pain.  - Telemetry monitoring - Dilaudid and Robaxin for pain control

## 2022-08-30 NOTE — ED Triage Notes (Signed)
Pt c/o chest pain d/t spasms and worsening PNA. On abx since Sunday with no relief. Pt alert and oriented. 10/10 sharp spasm pain.

## 2022-08-30 NOTE — ED Notes (Signed)
Intensivist team at bedside conducting [place]. Pt pain remains uncontrolled

## 2022-08-30 NOTE — ED Notes (Signed)
MD Jessup aware of Lactic.

## 2022-08-30 NOTE — Consult Note (Signed)
NAME:  Carl Le, MRN:  [id number], DOB:  22-Sep-1981, LOS: 0 ADMISSION DATE:  08/30/2022, CONSULTATION DATE:  08/30/22 REFERRING MD:  Cliffton Asters, NP, CHIEF COMPLAINT:  Shortness of breath   History of Present Illness:  41 yo M presenting to [facility] ED on 08/30/22 with complaints of 3-4 days of sharp right sided chest pain which he describes as muscle spasms. He admits to an associated cough with some shortness of breath. He denies fever/chills, vomiting or diarrhea. Taking deep breaths exacerbates his pain and nothing has improved his symptoms. History supplemented by chart documentation as the patient is in distress from severe pain and has been somewhat sedated intermittently with ativan & pain medications.  Of note he was seen 3 days ago at the [facility] ED and was diagnosed with a RUL pneumonia after CTa imaging. He was sent home with Augmentin and doxycycline with which he reports being compliant. He did not notice any improvement in his symptoms. He denies pain or swelling in his legs. He does report smoking a pack a day.  ED course: Upon arrival the patient was alert and oriented, afebrile but tachycardic & tachypneic, saturating 96% on RA. Lab work reveals leukocytosis and lactic acidosis with mild Transaminitis. Imaging demonstrated RLL opacity with effusion concerning for parapneumonic effusion. Sepsis work up initiated. TRH consulted for admission. Medications given: cefepime/vancomycin/flagyl, 2 L of LR bolus, zofran, dilaudid Initial Vitals: afebrile 98.1, tachypneic 32, tachycardic 129, BP 107/65 & 96 % on RA Significant labs: (Labs/ Imaging personally reviewed) I, Cheryll Cockayne Rust-Chester, AGACNP-BC, personally viewed and interpreted this ECG. EKG Interpretation: Date: 08/30/22, EKG Time: 16:14, Rate: 118, Rhythm: ST, QRS Axis: normal, Intervals: normal, ST/T Wave abnormalities: none, Narrative Interpretation: challenging to read due to artifact- ST Chemistry: Na+:135, K+: 4.7,  BUN/Cr.: 13/0.82, Serum CO2/ AG: 20/12, ALT: 82, T Bili: 1.9 Hematology: WBC: 25.5, Hgb: 16.2,  Troponin: 4 > 3, Lactic/ PCT: 2.0 > 2.2, COVID-19: negative ABG: 7.43/ 36/ 69/ 23.9 CXR 08/30/22: RIGHT basilar infiltrate consistent with pneumonia. Associated RIGHT parapneumonic pleural effusion. CT chest w contrast 08/30/22: pending  TRH NP contacted PCCM for evaluation as patient had large effusion and pain control has been challenging. PCCM consulted for assistance due to large RIGHT sided suspected para-pneumonic effusion with need for emergent thoracentesis vs CT placement.  Pertinent  Medical History  Kidney stone Herpes Zoster Current Everyday smoker Significant Hospital Events: Including procedures, antibiotic start and stop dates in addition to other pertinent events   08/30/22: Admit to Inpatient with CAP and new para-pneumonic effusion, PCCM consulted for CT placement vs thoracentesis.  Interim History / Subjective:  Bedside POC lung US performed, decision made to optimize patient for CT chest w contrast and Dr. Jules Schick coming bedside for possible CT placement.  Objective   Blood pressure (!) 151/92, pulse (!) 122, temperature 98.1 F (36.7 C), temperature source Oral, resp. rate (!) 35, height 6' (1.829 m), weight 77.1 kg, SpO2 95 %.        Intake/Output Summary (Last 24 hours) at 08/30/2022 2134 Last data filed at 08/30/2022 2042 Gross per 24 hour  Intake 2200 ml  Output --  Net 2200 ml   Filed Weights   08/30/22 1550  Weight: 77.1 kg    Examination: General: Adult male, acutely ill, sitting in bed appearing grossly uncomfortable- unable to sit still HEENT: MM pink/moist, anicteric, atraumatic, neck supple Neuro: A&O x 3- intermittently sedated, able to follow commands, PERRL +3, MAE CV: s1s2 RRR, ST on  monitor, no r/m/g Pulm: Regular, mildly labored on 3 L [place], breath sounds clear-LUL, RUL - clear diminished & clear-LLL, diminished- RML/RLL GI: tensed, flat,  mildly tender on right side, bs x 4 Skin: limited exam no rashes/lesions noted Extremities: warm/dry, pulses + 2 R/P, no edema noted  Resolved Hospital Problem list     Assessment & Plan:  CAP with suspected RIGHT sided parapnemonic effusion PMHx: everyday smoker - Supplemental O2 to maintain SpO2 > 90% - Intermittent chest x-ray & ABG PRN - Ensure adequate pulmonary hygiene  - F/u cultures, trend PCT > Strep pna & legionella pending - Continue CAP coverage: ceftriaxone & flagyl - agree with fentanyl & robaxin for pain control, ativan PRN for anxiety >> optimizing for STAT CT chest w contrast - will need thoracentesis vs CT placement depending on CT imaging results >> pleural studies ordered by primary service - bronchodilators PRN   Best Practice (right click and "Reselect all SmartList Selections" daily)  Diet/type: Regular consistency (see orders) DVT prophylaxis: SCD GI prophylaxis: N/A Lines: N/A Foley:  N/A Code Status:  full code Last date of multidisciplinary goals of care discussion [per primary service]  Labs   CBC: Recent Labs  Lab 08/30/22 1616  WBC 25.5*  NEUTROABS 23.0*  HGB 16.2  HCT 47.7  MCV 87.4  PLT 362    Basic Metabolic Panel: Recent Labs  Lab 08/30/22 1616  NA 135  K 4.7  CL 103  CO2 20*  GLUCOSE 150*  BUN 13  CREATININE 0.82  CALCIUM 9.5   GFR: Estimated Creatinine Clearance: 131.9 mL/min (by C-G formula based on SCr of 0.82 mg/dL). Recent Labs  Lab 08/30/22 1616 08/30/22 1829  WBC 25.5*  --   LATICACIDVEN 2.0* 2.2*    Liver Function Tests: Recent Labs  Lab 08/30/22 1616  AST 38  ALT 82*  ALKPHOS 104  BILITOT 1.9*  PROT 8.6*  ALBUMIN 4.3   No results for input(s): "LIPASE", "AMYLASE" in the last 168 hours. No results for input(s): "AMMONIA" in the last 168 hours.  ABG No results found for: "PHART", "PCO2ART", "PO2ART", "HCO3", "TCO2", "ACIDBASEDEF", "O2SAT"   Coagulation Profile: No results for input(s): "INR",  "PROTIME" in the last 168 hours.  Cardiac Enzymes: No results for input(s): "CKTOTAL", "CKMB", "CKMBINDEX", "TROPONINI" in the last 168 hours.  HbA1C: No results found for: "HGBA1C"  CBG: No results for input(s): "GLUCAP" in the last 168 hours.  Review of Systems: Positives in BOLD  Gen: Denies fever, chills, weight change, fatigue, night sweats HEENT: Denies blurred vision, double vision, hearing loss, tinnitus, sinus congestion, rhinorrhea, sore throat, neck stiffness, dysphagia PULM: Denies shortness of breath, cough, sputum production, hemoptysis, wheezing CV: Denies chest pain, edema, orthopnea, paroxysmal nocturnal dyspnea, palpitations GI: Denies abdominal pain, nausea, vomiting, diarrhea, hematochezia, melena, constipation, change in bowel habits GU: Denies dysuria, hematuria, polyuria, oliguria, urethral discharge Endocrine: Denies hot or cold intolerance, polyuria, polyphagia or appetite change Derm: Denies rash, dry skin, scaling or peeling skin change Heme: Denies easy bruising, bleeding, bleeding gums Neuro: Denies headache, numbness, weakness, slurred speech, loss of memory or consciousness Challenging interview as patient was in severe pain with intermittent sedation from pain control Past Medical History:  He,  has a past medical history of Current smoker, Herpes zoster, and Kidney calculus.   Surgical History:  History reviewed. No pertinent surgical history.   Social History:   reports that he has been smoking cigarettes. He has been smoking an average of 1 pack per day.  He does not have any smokeless tobacco history on file. He reports current alcohol use. 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   Family History:  His family history includes Heart attack in his brother and father.   Allergies Not on File   Home Medications  Prior to Admission medications   Medication Sig Start Date End Date Taking?  Authorizing Provider  amoxicillin-clavulanate (AUGMENTIN) 875-125 MG tablet Take 1 tablet by mouth 2 (two) times daily. 08/27/22  Yes [provider]  doxycycline (VIBRAMYCIN) 100 MG capsule Take 100 mg by mouth 2 (two) times daily. 08/27/22  Yes [provider]  HYDROcodone bit-homatropine (HYCODAN) 5-1.5 MG/5ML syrup Take 5 mLs by mouth every 6 (six) hours as needed. 08/27/22  Yes [provider]     Critical care time: 60 minutes       Venetia Night, AGACNP-BC Acute Care Nurse Practitioner [facility]   [phone] / [phone] Please see Amion for pager details.

## 2022-08-30 NOTE — ED Notes (Signed)
Pt has continued to bend arm delaying infusion. This RN educated pt on importance of keeping arm open. Pt is standing and unable to sit down d/t pain. Meds given per mar MD is aware.

## 2022-08-30 NOTE — Assessment & Plan Note (Addendum)
ALT and total bilirubin elevated on admission.  Recent right upper quadrant ultrasound  and CT abdomen/pelvis were within normal limits.  LFTs improving now

## 2022-08-30 NOTE — ED Provider Notes (Signed)
[facility] Provider Note    Event Date/Time   First MD Initiated Contact with Patient 08/30/22 1550     (approximate)   History   Chief Complaint Shortness of Breath (Chest pain and SOB )   HPI  Carl Le is a 41 y.o. male with no significant past medical history who presents to the ED complaining of shortness of breath.  Patient reports that he has been dealing with 3 to 4 days of sharp pain in the right side of his chest which will often feel like a muscle spasm intermittently.  He reports an associated cough with some difficulty breathing, denies any fevers.  He was seen 3 days ago in the [place] ED, diagnosed with right upper lobe pneumonia at that time and started on Augmentin as well as doxycycline.  He states he has been compliant with these antibiotics but has not had any improvement in his symptoms, complains of worsening pain in his chest.  He has not noticed any pain or swelling in his legs, denies similar symptoms in the past.     Physical Exam   Triage Vital Signs: ED Triage Vitals  Enc Vitals Group     BP --      Pulse --      Resp --      Temp --      Temp src --      SpO2 --      Weight 08/30/22 1550 170 lb (77.1 kg)     Height 08/30/22 1550 6' (1.829 m)     Head Circumference --      Peak Flow --      Pain Score 08/30/22 1549 9     Pain Loc --      Pain Edu? --      Excl. in [place]? --     Most recent vital signs: Vitals:   08/30/22 1555  BP: 107/65  Pulse: (!) 129  Resp: 20  Temp: 98.1 F (36.7 C)  SpO2: 96%    Constitutional: Alert and oriented. Eyes: Conjunctivae are normal. Head: Atraumatic. Nose: No congestion/rhinnorhea. Mouth/Throat: Mucous membranes are moist.  Cardiovascular: Tachycardic, regular rhythm. Grossly normal heart sounds.  2+ radial pulses bilaterally. Respiratory: Normal respiratory effort.  No retractions. Lungs CTAB.  No chest wall tenderness to palpation. Gastrointestinal: Soft and  nontender. No distention. Musculoskeletal: No lower extremity tenderness nor edema.  Neurologic:  Normal speech and language. No gross focal neurologic deficits are appreciated.    ED Results / Procedures / Treatments   Labs (all labs ordered are listed, but only abnormal results are displayed) Labs Reviewed  CBC WITH DIFFERENTIAL/PLATELET - Abnormal; Notable for the following components:      Result Value   WBC 25.5 (*)    Neutro Abs 23.0 (*)    Monocytes Absolute 1.4 (*)    Abs Immature Granulocytes 0.20 (*)    All other components within normal limits  COMPREHENSIVE METABOLIC PANEL - Abnormal; Notable for the following components:   CO2 20 (*)    Glucose, Bld 150 (*)    Total Protein 8.6 (*)    ALT 82 (*)    Total Bilirubin 1.9 (*)    All other components within normal limits  LACTIC ACID, PLASMA - Abnormal; Notable for the following components:   Lactic Acid, Venous 2.0 (*)    All other components within normal limits  SARS CORONAVIRUS 2 BY RT PCR  CULTURE, BLOOD (ROUTINE X 2)  CULTURE, BLOOD (ROUTINE X 2)  LACTIC ACID, PLASMA  TROPONIN I (HIGH SENSITIVITY)  TROPONIN I (HIGH SENSITIVITY)     EKG  ED ECG REPORT I, Chesley Noon, the attending physician, personally viewed and interpreted this ECG.   Date: 08/30/2022  EKG Time: 16:14  Rate: 118  Rhythm: sinus tachycardia  Axis: Normal  Intervals:none  ST&T Change: None  RADIOLOGY Chest x-ray reviewed and interpreted by me with right-sided infiltrate and associated effusion, consistent with pneumonia.  PROCEDURES:  Critical Care performed: Yes, see critical care procedure note(s)  .Critical Care  Performed by: Chesley Noon, MD Authorized by: Chesley Noon, MD   Critical care provider statement:    Critical care time (minutes):  30   Critical care time was exclusive of:  Separately billable procedures and treating other patients and teaching time   Critical care was necessary to treat or prevent  imminent or life-threatening deterioration of the following conditions:  Sepsis   Critical care was time spent personally by me on the following activities:  Development of treatment plan with patient or surrogate, discussions with consultants, evaluation of patient's response to treatment, examination of patient, ordering and review of laboratory studies, ordering and review of radiographic studies, ordering and performing treatments and interventions, pulse oximetry, re-evaluation of patient's condition and review of old charts   I assumed direction of critical care for this patient from another provider in my specialty: no     Care discussed with: admitting provider      MEDICATIONS ORDERED IN ED: Medications  vancomycin (VANCOCIN) IVPB 1000 mg/200 mL premix (has no administration in time range)  lactated ringers bolus 1,000 mL (has no administration in time range)  metroNIDAZOLE (FLAGYL) IVPB 500 mg (has no administration in time range)  morphine (PF) 4 MG/ML injection 4 mg (4 mg Intravenous Given 08/30/22 1610)  ondansetron (ZOFRAN) injection 4 mg (4 mg Intravenous Given 08/30/22 1610)  lactated ringers bolus 1,000 mL (1,000 mLs Intravenous New Bag/Given 08/30/22 1610)  ceFEPIme (MAXIPIME) 2 g in sodium chloride 0.9 % 100 mL IVPB (2 g Intravenous New Bag/Given 08/30/22 1704)  HYDROmorphone (DILAUDID) injection 1 mg (1 mg Intravenous Given 08/30/22 1658)     IMPRESSION / MDM / ASSESSMENT AND PLAN / ED COURSE  I reviewed the triage vital signs and the nursing notes.                              41 y.o. male with no significant past medical history presents to the ED with 3 to 4 days of worsening right-sided chest pain and spasms associated with cough and difficulty breathing.  Patient's presentation is most consistent with acute presentation with potential threat to life or bodily function.  Differential diagnosis includes, but is not limited to, pneumonia, PE, sepsis, ACS, pleurisy,  COVID-19.  Patient uncomfortable appearing, not wanting to sit down on stretcher due to described right-sided chest wall pain and spasms.  He is tachycardic but not in any respiratory distress, maintaining oxygen saturations on room air.  EKG shows sinus tachycardia with no ischemic changes, low suspicion for ACS at this time.  Recent visits to the [facility] ED were reviewed, CTA of chest was performed 3 days ago and negative for PE, did show right upper lobe pneumonia.  It appears patient has been compliant with Augmentin and doxycycline, despite this has not had any improvement in his symptoms.  We will treat symptomatically with IV morphine and  Zofran, hydrate with IV fluids.  Plan to check additional chest x-ray but do not feel repeat CTA is indicated given this was performed just 3 days ago.  Tachycardia is concerning for possible sepsis and we will check blood cultures and lactic acid.  Chest x-ray redemonstrates previously seen right-sided infiltrate, now with what appears to be parapneumonic effusion.  We will broaden antibiotics out to cefepime and vancomycin, patient remained stable from a respiratory perspective.  Labs remarkable for significant leukocytosis and patient meet sepsis criteria given his tachycardia as well.  Remainder of labs are reassuring with no significant anemia, electrolyte abnormality, AKI, or elevation in troponin.  Lactic acid is mildly elevated and we will trend following IV fluid bolus.  Case discussed with hospitalist for admission, who request that we add on IV Flagyl for antibiotic coverage.      FINAL CLINICAL IMPRESSION(S) / ED DIAGNOSES   Final diagnoses:  Sepsis without acute organ dysfunction, due to unspecified organism (HCC)  Pneumonia of right lung due to infectious organism, unspecified part of lung     Rx / DC Orders   ED Discharge Orders     None        Note:  This document was prepared using Dragon voice recognition software and may include  unintentional dictation errors.   Chesley Noon, MD 08/30/22 [phone]

## 2022-08-30 NOTE — ED Notes (Signed)
Pt sleeping restlessly in bed. Provider remains at bedside. Bed low and locked with side rails raised x2. Monitor in place

## 2022-08-30 NOTE — Sepsis Progress Note (Signed)
Elink tracking the Code Sepsis. 

## 2022-08-31 ENCOUNTER — Inpatient Hospital Stay: Payer: BLUE CROSS/BLUE SHIELD

## 2022-08-31 ENCOUNTER — Encounter: Payer: Self-pay | Admitting: Internal Medicine

## 2022-08-31 DIAGNOSIS — J189 Pneumonia, unspecified organism: Secondary | ICD-10-CM | POA: Diagnosis present

## 2022-08-31 DIAGNOSIS — A419 Sepsis, unspecified organism: Secondary | ICD-10-CM | POA: Insufficient documentation

## 2022-08-31 DIAGNOSIS — J918 Pleural effusion in other conditions classified elsewhere: Secondary | ICD-10-CM

## 2022-08-31 HISTORY — DX: Pneumonia, unspecified organism: J18.9

## 2022-08-31 LAB — CBC WITH DIFFERENTIAL/PLATELET
Abs Immature Granulocytes: 0.18 10*3/uL — ABNORMAL HIGH (ref 0.00–0.07)
Basophils Absolute: 0.1 10*3/uL (ref 0.0–0.1)
Basophils Relative: 0 %
Eosinophils Absolute: 0 10*3/uL (ref 0.0–0.5)
Eosinophils Relative: 0 %
HCT: 41.9 % (ref 39.0–52.0)
Hemoglobin: 14 g/dL (ref 13.0–17.0)
Immature Granulocytes: 1 %
Lymphocytes Relative: 4 %
Lymphs Abs: 0.9 10*3/uL (ref 0.7–4.0)
MCH: 29.3 pg (ref 26.0–34.0)
MCHC: 33.4 g/dL (ref 30.0–36.0)
MCV: 87.7 fL (ref 80.0–100.0)
Monocytes Absolute: 1.8 10*3/uL — ABNORMAL HIGH (ref 0.1–1.0)
Monocytes Relative: 8 %
Neutro Abs: 19.1 10*3/uL — ABNORMAL HIGH (ref 1.7–7.7)
Neutrophils Relative %: 87 %
Platelets: 288 10*3/uL (ref 150–400)
RBC: 4.78 MIL/uL (ref 4.22–5.81)
RDW: 12.9 % (ref 11.5–15.5)
WBC: 22.1 10*3/uL — ABNORMAL HIGH (ref 4.0–10.5)
nRBC: 0 % (ref 0.0–0.2)

## 2022-08-31 LAB — BODY FLUID CELL COUNT WITH DIFFERENTIAL
Eos, Fluid: 1 %
Lymphs, Fluid: 2 %
Monocyte-Macrophage-Serous Fluid: 1 %
Neutrophil Count, Fluid: 96 %
Total Nucleated Cell Count, Fluid: 36950 cu mm

## 2022-08-31 LAB — MAGNESIUM: Magnesium: 1.4 mg/dL — ABNORMAL LOW (ref 1.7–2.4)

## 2022-08-31 LAB — URINALYSIS, ROUTINE W REFLEX MICROSCOPIC
Bilirubin Urine: NEGATIVE
Glucose, UA: NEGATIVE mg/dL
Hgb urine dipstick: NEGATIVE
Ketones, ur: NEGATIVE mg/dL
Leukocytes,Ua: NEGATIVE
Nitrite: NEGATIVE
Protein, ur: NEGATIVE mg/dL
Specific Gravity, Urine: 1.032 — ABNORMAL HIGH (ref 1.005–1.030)
pH: 5 (ref 5.0–8.0)

## 2022-08-31 LAB — ALBUMIN: Albumin: 3.5 g/dL (ref 3.5–5.0)

## 2022-08-31 LAB — PROTEIN, PLEURAL OR PERITONEAL FLUID: Total protein, fluid: 5.6 g/dL

## 2022-08-31 LAB — GLUCOSE, CAPILLARY
Glucose-Capillary: 120 mg/dL — ABNORMAL HIGH (ref 70–99)
Glucose-Capillary: 118 mg/dL — ABNORMAL HIGH (ref 70–99)
Glucose-Capillary: 135 mg/dL — ABNORMAL HIGH (ref 70–99)
Glucose-Capillary: 112 mg/dL — ABNORMAL HIGH (ref 70–99)
Glucose-Capillary: 123 mg/dL — ABNORMAL HIGH (ref 70–99)
Glucose-Capillary: 144 mg/dL — ABNORMAL HIGH (ref 70–99)

## 2022-08-31 LAB — PATHOLOGIST SMEAR REVIEW

## 2022-08-31 LAB — COMPREHENSIVE METABOLIC PANEL
ALT: 46 U/L — ABNORMAL HIGH (ref 0–44)
AST: 21 U/L (ref 15–41)
Albumin: 3.1 g/dL — ABNORMAL LOW (ref 3.5–5.0)
Alkaline Phosphatase: 70 U/L (ref 38–126)
Anion gap: 8 (ref 5–15)
BUN: 15 mg/dL (ref 6–20)
CO2: 21 mmol/L — ABNORMAL LOW (ref 22–32)
Calcium: 7.8 mg/dL — ABNORMAL LOW (ref 8.9–10.3)
Chloride: 106 mmol/L (ref 98–111)
Creatinine, Ser: 0.78 mg/dL (ref 0.61–1.24)
GFR, Estimated: >60 mL/min (ref >60)
Glucose, Bld: 116 mg/dL — ABNORMAL HIGH (ref 70–99)
Potassium: 3.7 mmol/L (ref 3.5–5.1)
Sodium: 135 mmol/L (ref 135–145)
Total Bilirubin: 2 mg/dL — ABNORMAL HIGH (ref 0.3–1.2)
Total Protein: 6.1 g/dL — ABNORMAL LOW (ref 6.5–8.1)

## 2022-08-31 LAB — LACTATE DEHYDROGENASE, PLEURAL OR PERITONEAL FLUID: LD, Fluid: 938 U/L — ABNORMAL HIGH (ref 3–23)

## 2022-08-31 LAB — CBG MONITORING, ED
Glucose-Capillary: 125 mg/dL — ABNORMAL HIGH (ref 70–99)
Glucose-Capillary: 122 mg/dL — ABNORMAL HIGH (ref 70–99)

## 2022-08-31 LAB — STREP PNEUMONIAE URINARY ANTIGEN: Strep Pneumo Urinary Antigen: NEGATIVE

## 2022-08-31 LAB — PROCALCITONIN: Procalcitonin: 2.15 ng/mL

## 2022-08-31 LAB — MRSA NEXT GEN BY PCR, NASAL: MRSA by PCR Next Gen: NOT DETECTED

## 2022-08-31 LAB — GLUCOSE, PLEURAL OR PERITONEAL FLUID: Glucose, Fluid: 78 mg/dL

## 2022-08-31 LAB — LACTIC ACID, PLASMA
Lactic Acid, Venous: 1.5 mmol/L (ref 0.5–1.9)
Lactic Acid, Venous: 1.1 mmol/L (ref 0.5–1.9)

## 2022-08-31 LAB — HIV ANTIBODY (ROUTINE TESTING W REFLEX): HIV Screen 4th Generation wRfx: NONREACTIVE

## 2022-08-31 LAB — HEMOGLOBIN A1C
Hgb A1c MFr Bld: 4.7 % — ABNORMAL LOW (ref 4.8–5.6)
Mean Plasma Glucose: 88.19 mg/dL

## 2022-08-31 LAB — PHOSPHORUS: Phosphorus: 2.4 mg/dL — ABNORMAL LOW (ref 2.5–4.6)

## 2022-08-31 MED ORDER — PIPERACILLIN-TAZOBACTAM 3.375 G IVPB
3.3750 g | Freq: Three times a day (TID) | INTRAVENOUS | Status: DC
  Administered 2022-08-31 – 2022-09-02 (×8): 3.375 g via INTRAVENOUS
  Filled 2022-08-31 (×8): qty 50

## 2022-08-31 MED ORDER — CHLORHEXIDINE GLUCONATE CLOTH 2 % EX PADS
6.0000 | MEDICATED_PAD | Freq: Every day | CUTANEOUS | Status: DC
  Administered 2022-08-31 – 2022-09-02 (×3): 6 via TOPICAL

## 2022-08-31 MED ORDER — MAGNESIUM SULFATE 4 GM/100ML IV SOLN
4.0000 g | Freq: Once | INTRAVENOUS | Status: AC
  Administered 2022-08-31: 4 g via INTRAVENOUS
  Filled 2022-08-31 (×2): qty 100

## 2022-08-31 MED ORDER — IPRATROPIUM-ALBUTEROL 0.5-2.5 (3) MG/3ML IN SOLN: 3.0000 mL | RESPIRATORY_TRACT | Status: DC | PRN

## 2022-08-31 MED ORDER — DIAZEPAM 5 MG/ML IJ SOLN: 2.5000 mg | INTRAMUSCULAR | Status: DC | PRN

## 2022-08-31 MED ORDER — VANCOMYCIN HCL 750 MG/150ML IV SOLN
750.0000 mg | Freq: Once | INTRAVENOUS | Status: AC
  Administered 2022-08-31: 750 mg via INTRAVENOUS
  Filled 2022-08-31: qty 150

## 2022-08-31 MED ORDER — SODIUM CHLORIDE 0.9% FLUSH
20.0000 mL | Freq: Four times a day (QID) | INTRAVENOUS | Status: DC
  Administered 2022-08-31 – 2022-09-02 (×12): 20 mL via INTRAPLEURAL

## 2022-08-31 MED ORDER — STERILE WATER FOR INJECTION IJ SOLN
5.0000 mg | Freq: Two times a day (BID) | RESPIRATORY_TRACT | Status: DC
  Administered 2022-08-31: 5 mg via INTRAPLEURAL
  Filled 2022-08-31 (×2): qty 5

## 2022-08-31 MED ORDER — SODIUM CHLORIDE 0.9 % IV SOLN
500.0000 mg | INTRAVENOUS | Status: DC
  Administered 2022-08-31 – 2022-09-02 (×3): 500 mg via INTRAVENOUS
  Filled 2022-08-31: qty 5
  Filled 2022-08-31 (×2): qty 500

## 2022-08-31 MED ORDER — NICOTINE 14 MG/24HR TD PT24
14.0000 mg | MEDICATED_PATCH | Freq: Every day | TRANSDERMAL | Status: DC
  Administered 2022-08-31 – 2022-09-02 (×3): 14 mg via TRANSDERMAL
  Filled 2022-08-31 (×2): qty 1

## 2022-08-31 MED ORDER — MORPHINE SULFATE 1 MG/ML IV SOLN PCA
INTRAVENOUS | Status: DC
  Administered 2022-08-31: 1 mg via INTRAVENOUS
  Filled 2022-08-31 (×6): qty 30

## 2022-08-31 MED ORDER — DIAZEPAM 5 MG/ML IJ SOLN
5.0000 mg | INTRAMUSCULAR | Status: DC | PRN
  Administered 2022-08-31 – 2022-09-02 (×14): 5 mg via INTRAVENOUS
  Filled 2022-08-31 (×14): qty 2

## 2022-08-31 MED ORDER — ENOXAPARIN SODIUM 40 MG/0.4ML IJ SOSY
40.0000 mg | PREFILLED_SYRINGE | INTRAMUSCULAR | Status: DC
  Administered 2022-08-31 – 2022-09-01 (×2): 40 mg via SUBCUTANEOUS
  Filled 2022-08-31 (×2): qty 0.4

## 2022-08-31 MED ORDER — GUAIFENESIN ER 600 MG PO TB12
1200.0000 mg | ORAL_TABLET | Freq: Two times a day (BID) | ORAL | Status: DC
  Administered 2022-08-31 – 2022-09-02 (×4): 1200 mg via ORAL
  Filled 2022-08-31 (×4): qty 2

## 2022-08-31 MED ORDER — VANCOMYCIN HCL 1500 MG/300ML IV SOLN
1500.0000 mg | Freq: Two times a day (BID) | INTRAVENOUS | Status: DC
  Administered 2022-08-31 – 2022-09-01 (×2): 1500 mg via INTRAVENOUS
  Filled 2022-08-31 (×3): qty 300

## 2022-08-31 MED ORDER — HYDROMORPHONE HCL 1 MG/ML IJ SOLN
0.5000 mg | Freq: Once | INTRAMUSCULAR | Status: AC
  Administered 2022-08-31: 0.5 mg via INTRAVENOUS

## 2022-08-31 MED ORDER — HYDROMORPHONE HCL 1 MG/ML IJ SOLN
1.0000 mg | Freq: Once | INTRAMUSCULAR | Status: DC
  Filled 2022-08-31: qty 1

## 2022-08-31 MED ORDER — SODIUM CHLORIDE (PF) 0.9 % IJ SOLN
10.0000 mg | Freq: Two times a day (BID) | INTRAMUSCULAR | Status: DC
  Administered 2022-08-31: 10 mg via INTRAPLEURAL
  Filled 2022-08-31 (×2): qty 10

## 2022-08-31 MED ORDER — ACETAMINOPHEN 325 MG PO TABS
650.0000 mg | ORAL_TABLET | Freq: Four times a day (QID) | ORAL | Status: DC | PRN
  Administered 2022-08-31 (×2): 650 mg via ORAL
  Filled 2022-08-31 (×2): qty 2

## 2022-08-31 NOTE — Progress Notes (Signed)
NAME:  Carl Le, MRN:  [id number], DOB:  Nov 01, 1981, LOS: 1 ADMISSION DATE:  08/30/2022, CONSULTATION DATE:  08/30/2022 REFERRING MD:  Max Sane, MD , CHIEF COMPLAINT:  Right sided parapneumonic effusion   History of Present Illness:   41 yo M presenting to [facility] ED on 08/30/22 with complaints of 3-4 days of sharp pleuritic right sided chest pain. He admits to an associated cough with some shortness of breath. He denies fever/chills, vomiting or diarrhea. Taking deep breaths exacerbates his pain and nothing has improved his symptoms.  Of note he was seen 3 days prior to presentation at the [facility] ED and was diagnosed with a pneumonia after CTa imaging. He was sent home with Augmentin and doxycycline with which he reports being compliant. He did not notice any improvement in his symptoms. He denies pain or swelling in his legs. He does report smoking a pack a day.   In the ED, he was noted to have a right sided infiltrate on CXR with an elevated white count of 25.5K. Antibiotics were administered and patient was admitted to medicine. TRH contacted pulmonary overnight for the evaluation of a possible pleural effusion. On [place], there appeared to be an effusion in place, and this was confirmed with a contrasted CT scan of the chest. An ultrasound guided pig-tail catheter was placed in the right pleural space with pleural fluid consistent with a para-pneumonic effusion.  Pertinent  Medical History  Kidney stone Herpes Zoster Current Everyday smoker  Significant Hospital Events: Including procedures, antibiotic start and stop dates in addition to other pertinent events   08/30/2022: chest tube placed under ultrasound guidance 08/31/2022: started tpa/DNAse, discussed case with thoracic surgery at [facility]. Recommended tpa/dnase and reimage prior to consideration of a VATS washout.  Interim History / Subjective:  Continues to report significant right sided chest wall pain and muscle  spasms  Objective   Blood pressure (!) 149/71, pulse (!) 118, temperature 97.7 F (36.5 C), temperature source Oral, resp. rate (!) 46, height 6' (1.829 m), weight 77.1 kg, SpO2 95 %.        Intake/Output Summary (Last 24 hours) at 08/31/2022 1106 Last data filed at 08/31/2022 1000 Gross per 24 hour  Intake 3889.05 ml  Output 970 ml  Net 2919.05 ml   Filed Weights   08/30/22 1550  Weight: 77.1 kg    Examination: Physical Exam Vitals reviewed.  Constitutional:      General: He is in acute distress.     Appearance: He is well-developed. He is not ill-appearing.  HENT:     Head: Normocephalic.  Eyes:     Extraocular Movements: Extraocular movements intact.     Pupils: Pupils are equal, round, and reactive to light.  Cardiovascular:     Rate and Rhythm: Regular rhythm. Tachycardia present.     Pulses: Normal pulses.     Heart sounds: Normal heart sounds.  Pulmonary:     Effort: Respiratory distress present.     Breath sounds: Rales (over the right base) present.  Abdominal:     Palpations: Abdomen is soft.  Musculoskeletal:     Right lower leg: No edema.     Left lower leg: No edema.  Neurological:     General: No focal deficit present.     Mental Status: He is alert and oriented to person, place, and time.      Assessment & Plan:   #Community Acquired Pneumonia of the RLL #Complicated Para-pneumonic effusion  41 year  old male presenting with cough, leukocytosis, and a right sided infiltrate on CXR. Confirmed to be a lobar pneumonia on CT with a surrounding parapneumonic effusion. On POCUS there was a right sided pleural effusion with underlying collapse/consolidation. Overall picture highly concerning for community acquired pneumonia with parapneumonic effusion for which tube thoracostomy was performed yesterday under ultrasound guidance. Pleural fluid was cloudy and thick (LDH 938, 37K white cells with 96% neutrophils, gram stain negative, culture pending).  Appearance and LDH suggest a complicated para-pneumonic effusion (2 points on the RAPID score suggesting low risk of mortality). Will initiate q6hour intra-pleural saline flushes in addition to the initiation of intra-pleural tpa/DNAse. Continue with broad spectrum antibiotics to include coverage for legionella and MRSA pending pleural fluid cultures. Patient's case was discussed with thoracic surgery at [facility] and will proceed with tpa/dnase prior to repeat imaging prior to re-consideration for surgery for VATS washout. Our team will continue to follow along.  Raechel Chute, MD [place] [facility]  Labs   CBC: Recent Labs  Lab 08/30/22 1616 08/31/22 0255  WBC 25.5* 22.1*  NEUTROABS 23.0* 19.1*  HGB 16.2 14.0  HCT 47.7 41.9  MCV 87.4 87.7  PLT 362 288    Basic Metabolic Panel: Recent Labs  Lab 08/30/22 1616 08/31/22 0255  NA 135 135  K 4.7 3.7  CL 103 106  CO2 20* 21*  GLUCOSE 150* 116*  BUN 13 15  CREATININE 0.82 0.78  CALCIUM 9.5 7.8*  MG  --  1.4*  PHOS  --  2.4*   GFR: Estimated Creatinine Clearance: 135.2 mL/min (by C-G formula based on SCr of 0.78 mg/dL). Recent Labs  Lab 08/30/22 1616 08/30/22 1829 08/31/22 0015 08/31/22 0255  PROCALCITON  --   --   --  2.15  WBC 25.5*  --   --  22.1*  LATICACIDVEN 2.0* 2.2* 1.5 1.1    Liver Function Tests: Recent Labs  Lab 08/30/22 1616 08/31/22 0015 08/31/22 0255  AST 38  --  21  ALT 82*  --  46*  ALKPHOS 104  --  70  BILITOT 1.9*  --  2.0*  PROT 8.6*  --  6.1*  ALBUMIN 4.3 3.5 3.1*   No results for input(s): "LIPASE", "AMYLASE" in the last 168 hours. No results for input(s): "AMMONIA" in the last 168 hours.  ABG    Component Value Date/Time   PHART 7.43 08/30/2022 2131   PCO2ART 36 08/30/2022 2131   PO2ART 69 (L) 08/30/2022 2131   HCO3 23.9 08/30/2022 2131   ACIDBASEDEF 0.1 08/30/2022 2131   O2SAT 97.3 08/30/2022 2131     Coagulation Profile: No results for input(s): "INR",  "PROTIME" in the last 168 hours.  Cardiac Enzymes: No results for input(s): "CKTOTAL", "CKMB", "CKMBINDEX", "TROPONINI" in the last 168 hours.  HbA1C: Hgb A1c MFr Bld  Date/Time Value Ref Range Status  08/31/2022 02:55 AM 4.7 (L) 4.8 - 5.6 % Final    Comment:    (NOTE) Pre diabetes:          5.7%-6.4%  Diabetes:              >6.4%  Glycemic control for   <7.0% adults with diabetes     CBG: Recent Labs  Lab 08/31/22 0218 08/31/22 0441 08/31/22 0554 08/31/22 0742  GLUCAP 125* 122* 120* 118*    Past Medical History:  He,  has a past medical history of Current smoker, Herpes zoster, Kidney calculus, and Mild emphysema (HCC) (08/30/2022).  Surgical History:  History reviewed. No pertinent surgical history.   Social History:   reports that he has been smoking cigarettes. He has been smoking an average of 1 pack per day. He does not have any smokeless tobacco history on file. He reports current alcohol use. 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   Family History:  His family history includes Heart attack in his brother and father.   Allergies Not on File   Home Medications  Prior to Admission medications   Medication Sig Start Date End Date Taking? Authorizing Provider  amoxicillin-clavulanate (AUGMENTIN) 875-125 MG tablet Take 1 tablet by mouth 2 (two) times daily. 08/27/22  Yes [provider]  doxycycline (VIBRAMYCIN) 100 MG capsule Take 100 mg by mouth 2 (two) times daily. 08/27/22  Yes [provider]  HYDROcodone bit-homatropine (HYCODAN) 5-1.5 MG/5ML syrup Take 5 mLs by mouth every 6 (six) hours as needed. 08/27/22  Yes [provider]     Total care time: 52 minutes

## 2022-08-31 NOTE — Hospital Course (Addendum)
41 year old male admitted for community acquired pneumonia of right lower lobe and was found to have complicated parapneumonic effusion  10/25: Status post chest tube placement under ultrasound guidance for parapneumonic effusion 10/26: pleural fibrinolysis per PCCM team 10/27: waiting for [facility] transfer for CT surgery eval 10/28: Pleural fibrinolysis done again while waiting for bed at [facility]

## 2022-08-31 NOTE — Progress Notes (Signed)
Patient admitted to ICU 09 on 5L nasal cannula and LR infusing at 100 ml/hr. Patient complaining of 10 out of 10 muscle spasms/pain. Orders released and initiated.

## 2022-08-31 NOTE — Assessment & Plan Note (Signed)
Seen on CT scan.  On POCUS he had right-sided pleural effusion with underlying collapse/consolidation so underwent ultrasound-guided tube thoracostomy yesterday.  Underwent intrapleural fibrinolytic therapy with tPA/DNase. Continue broad-spectrum antibiotics PCCM discussed the case with thoracic surgery at [facility] who recommends to continue management as is while here and no need for transfer for to [facility] now

## 2022-08-31 NOTE — ED Notes (Signed)
Pt reporting increase in muscle spasms. RN called pharmacy to request they mix and send Robaxin for RN to administer.

## 2022-08-31 NOTE — Progress Notes (Signed)
Received report from [employer].

## 2022-08-31 NOTE — ED Notes (Signed)
CBG 122 at this time. McKenzie, RN made aware. Pt resting comfortably.

## 2022-08-31 NOTE — Progress Notes (Signed)
Insertion of Chest Tube Procedure Note  Carl Le  [id number]  17-Dec-1980  Date:08/31/22  Time:12:00 AM    Provider Performing: Berdine Addison Aizen Duval   Procedure: Pleural Catheter Insertion w/ Imaging Guidance [phone])  Indication(s) Effusion  Consent Risks of the procedure as well as the alternatives and risks of each were explained to the patient and/or caregiver.  Consent for the procedure was obtained and is signed in the bedside chart  Anesthesia Topical only with 1% lidocaine    Time Out Verified patient identification, verified procedure, site/side was marked, verified correct patient position, special equipment/implants available, medications/allergies/relevant history reviewed, required imaging and test results available.   Sterile Technique Maximal sterile technique including full sterile barrier drape, hand hygiene, sterile gown, sterile gloves, mask, hair covering, sterile ultrasound probe cover (if used).   Procedure Description Ultrasound used to identify appropriate pleural anatomy for placement and overlying skin marked. Area of placement cleaned and draped in sterile fashion. After appropriate instillation of subQ and intradermal anesthetic, the pleural space was accessed with a needle and a wire was placed. The wire was confirmed to be in place in the pleural cavity using real time ultrasound guidance. A 14 French pigtail pleural catheter was then placed into the right pleural space using Seldinger technique. Appropriate return of fluid was obtained.  The tube was connected to atrium and placed on -20 cm H2O wall suction.   Complications/Tolerance None; patient tolerated the procedure well. Chest X-ray is ordered to verify placement.   EBL Minimal  Specimen(s) none  Carl Reichert, MD [place] [facility]

## 2022-08-31 NOTE — Consult Note (Signed)
Pharmacy Antibiotic Note  Carl Le is a 41 y.o. male admitted on 08/30/2022 with  complaints of chest pain and shortness of breath .  Patient states he has been experiencing right-sided chest pain and abdominal pain for approximately 1 week.  In addition, he endorses nausea but denies any fever, chills. Due to this, he went to the [facility] ED on 10/22.  Work-up included a chest x-ray, right upper quadrant ultrasound, CT abdomen/pelvis, and CTA chest.  He was found to have evidence of a right-sided pleural effusion and surrounding opacity.  He was discharged home with cough medicine only.  On 10/23, he began to develop shortness of breath and cough and he went back to the ED.  At this time, he was discharged home with Augmentin and doxycycline.  He has been taking his antibiotics as prescribed, however his right-sided chest pain has progressively worsened.  Ches X-ray today shows enlarging pleural effusion consistent with CAP complicated by either para-mnemonic effusion or empyema.  Pharmacy has been consulted for Vancomycin and Zosyn dosing. Of note, patient has also been ordered Azithromycin. Patient received Vancomycin 1g IV, Flagyl 500mg  IV, and Cefepime 2g IV x 1 dose each in the ED.  Plan: Will order additional Vancomycin 750mg  IV x 1 to complete loading dose of 1750mg , followed by: Vancomycin 1500 mg IV Q 12 hrs. Goal AUC 400-550. Expected AUC: 460.6 Expected Cmin: 10.4 SCr used: 0.8(actual 0.78)  Zosyn 3.375 g IV Q8 hours(extended infusion)   Height: 6' (182.9 cm) Weight: 77.1 kg (170 lb) IBW/kg (Calculated) : 77.6  Temp (24hrs), Avg:98.6 F (37 C), Min:98.1 F (36.7 C), Max:99 F (37.2 C)  Recent Labs  Lab 08/30/22 1616 08/30/22 1829 08/31/22 0015 08/31/22 0255  WBC 25.5*  --   --  22.1*  CREATININE 0.82  --   --  0.78  LATICACIDVEN 2.0* 2.2* 1.5 1.1    Estimated Creatinine Clearance: 135.2 mL/min (by C-G formula based on SCr of 0.78 mg/dL).    Not on  File  Antimicrobials this admission: Vancomycin 10/25 >>  Zosyn 10/25 >>  Cefepime/Flagyl 10/25 x1  Dose adjustments this admission: N/A  Microbiology results: 10/25 BCx: collected 10/25 Pleural fluid cx: collected  Thank you for allowing pharmacy to be a part of this patient's care.  Saiquan Hands A Leshay Desaulniers 08/31/2022 4:06 AM

## 2022-08-31 NOTE — Progress Notes (Signed)
Chest tube flushed per MD order.

## 2022-08-31 NOTE — Progress Notes (Signed)
Pleural Fibrinolytic Administration Procedure Note  Carl Le  [id number]  November 04, 1981  Date:08/31/22  Time:11:04 AM   Provider Performing:Lashane Whelpley   Procedure: Pleural Fibrinolysis Initial day [phone]  Indication(s) Fibrinolysis of complicated pleural effusion  Consent Risks of the procedure as well as the alternatives and risks of each were explained to the patient and/or caregiver.  Consent for the procedure was obtained.   Anesthesia None   Time Out Verified patient identification, verified procedure, site/side was marked, verified correct patient position, special equipment/implants available, medications/allergies/relevant history reviewed, required imaging and test results available.   Sterile Technique Hand hygiene, gloves   Procedure Description Existing pleural catheter was cleaned and accessed in sterile manner.  10mg  of tPA in 30cc of saline and 5mg  of dornase in 30cc of sterile water were injected into pleural space using existing pleural catheter.  Catheter will be clamped for 1 hour and then placed back to water seal.   Complications/Tolerance None; patient tolerated the procedure well.  EBL None   Specimen(s) None  Armando Reichert, MD [place] [facility]

## 2022-08-31 NOTE — Progress Notes (Signed)
CAP with para-pneumonic effusion s/p CT placement Discussed with Dr. Genia Harold, antibiotic regimen adjusted: - Azithromycin, Zosyn & Vancomycin - f/u pleural fluid lab work, MRSA PCR, Strep pna & legionella   Domingo Pulse [place], AGACNP-BC Acute Care Nurse Practitioner [facility]   [phone] / [phone] Please see Amion for pager details.

## 2022-08-31 NOTE — Progress Notes (Signed)
  Progress Note   Patient: Carl Le:[id number] DOB: 25-Aug-1981 DOA: 08/30/2022     1 DOS: the patient was seen and examined on 08/31/2022   Brief hospital course: 41 year old male admitted for community acquired pneumonia of right lower lobe and was found to have complicated parapneumonic effusion  10/25: Status post chest tube placement under ultrasound guidance for parapneumonic effusion 10/26: pleural fibrinolysis per PCCM team   Assessment and Plan: * CAP (community acquired pneumonia) CTA chest obtained on 10/22 demonstrated small right pleural effusion.  Patient presented with worsening symptoms with chest x-ray demonstrating enlarging pleural effusion consistent with CAP complicated by either para-pneumonic effusion or empyema.  CT confirmed parapneumonic effusion  - Continue vancomycin, Zosyn, azithromycin - Strep pneumo and Legionella urinary antigen   Sinus tachycardia Likely in the setting of uncontrolled pain.  - Telemetry monitoring - Dilaudid and Robaxin for pain control  LFT elevation ALT and total bilirubin elevated on admission.  Recent right upper quadrant ultrasound  and CT abdomen/pelvis were within normal limits.  LFTs improving now  Hyperglycemia In the setting of acute illness  - A1c 4.7.  This is not diabetes - No indication for SSI at this time  Parapneumonic effusion Seen on CT scan.  On POCUS he had right-sided pleural effusion with underlying collapse/consolidation so underwent ultrasound-guided tube thoracostomy yesterday.  Underwent intrapleural fibrinolytic therapy with tPA/DNase. Continue broad-spectrum antibiotics PCCM discussed the case with thoracic surgery at [facility] who recommends to continue management as is while here and no need for transfer for to [facility] now       Subjective: Feels somewhat better and thankful for his care here but still reporting 10 out of 10 pain  Physical Exam: Vitals:   08/31/22 1500  08/31/22 1600 08/31/22 1618 08/31/22 1700  BP: (!) 128/93 138/86  125/77  Pulse: (!) 108 (!) 120 (!) 118 (!) 116  Resp: (!) 45 (!) 25 (!) 41 (!) 37  Temp:      TempSrc:      SpO2: 96% 94% 93% 96%  Weight:      Height:       41 year old male lying in the bed in no acute pain/distress Lungs Rales at the bases right more than the left, chest tube on the right Cardiovascular regular rhythm, tachycardic Abdomen soft, benign Neuro alert and awake, nonfocal Data Reviewed:  CT chest showed Loculated trace to small volume right pleural effusion  Family Communication: None  Disposition: Status is: Inpatient Remains inpatient appropriate because: Pneumonia and complicated parapneumonic effusion management with chest tube   Planned Discharge Destination: Home    DVT prophylaxis-Lovenox Time spent: 35 minutes  Author: Max Sane, MD 08/31/2022 5:18 PM  For on call review [email or web address].

## 2022-09-01 ENCOUNTER — Inpatient Hospital Stay: Admit: 2022-09-01 | Payer: Self-pay | Admitting: Internal Medicine

## 2022-09-01 ENCOUNTER — Encounter (HOSPITAL_COMMUNITY): Payer: Self-pay

## 2022-09-01 ENCOUNTER — Inpatient Hospital Stay: Payer: BLUE CROSS/BLUE SHIELD

## 2022-09-01 DIAGNOSIS — J918 Pleural effusion in other conditions classified elsewhere: Secondary | ICD-10-CM | POA: Diagnosis not present

## 2022-09-01 DIAGNOSIS — J189 Pneumonia, unspecified organism: Secondary | ICD-10-CM | POA: Diagnosis not present

## 2022-09-01 DIAGNOSIS — R7989 Other specified abnormal findings of blood chemistry: Secondary | ICD-10-CM | POA: Diagnosis not present

## 2022-09-01 DIAGNOSIS — R739 Hyperglycemia, unspecified: Secondary | ICD-10-CM | POA: Diagnosis not present

## 2022-09-01 LAB — GLUCOSE, CAPILLARY
Glucose-Capillary: 116 mg/dL — ABNORMAL HIGH (ref 70–99)
Glucose-Capillary: 110 mg/dL — ABNORMAL HIGH (ref 70–99)
Glucose-Capillary: 111 mg/dL — ABNORMAL HIGH (ref 70–99)
Glucose-Capillary: 115 mg/dL — ABNORMAL HIGH (ref 70–99)

## 2022-09-01 LAB — BASIC METABOLIC PANEL
Anion gap: 9 (ref 5–15)
BUN: 15 mg/dL (ref 6–20)
CO2: 23 mmol/L (ref 22–32)
Calcium: 8.4 mg/dL — ABNORMAL LOW (ref 8.9–10.3)
Chloride: 98 mmol/L (ref 98–111)
Creatinine, Ser: 0.79 mg/dL (ref 0.61–1.24)
GFR, Estimated: >60 mL/min (ref >60)
Glucose, Bld: 159 mg/dL — ABNORMAL HIGH (ref 70–99)
Potassium: 4.1 mmol/L (ref 3.5–5.1)
Sodium: 130 mmol/L — ABNORMAL LOW (ref 135–145)

## 2022-09-01 LAB — CBC
HCT: 41.1 % (ref 39.0–52.0)
Hemoglobin: 13.9 g/dL (ref 13.0–17.0)
MCH: 29.3 pg (ref 26.0–34.0)
MCHC: 33.8 g/dL (ref 30.0–36.0)
MCV: 86.5 fL (ref 80.0–100.0)
Platelets: 277 10*3/uL (ref 150–400)
RBC: 4.75 MIL/uL (ref 4.22–5.81)
RDW: 12.8 % (ref 11.5–15.5)
WBC: 24.3 10*3/uL — ABNORMAL HIGH (ref 4.0–10.5)
nRBC: 0 % (ref 0.0–0.2)

## 2022-09-01 LAB — TRIGLYCERIDES, BODY FLUIDS: Triglycerides, Fluid: 49 mg/dL

## 2022-09-01 LAB — PHOSPHORUS: Phosphorus: 2.4 mg/dL — ABNORMAL LOW (ref 2.5–4.6)

## 2022-09-01 LAB — MAGNESIUM: Magnesium: 2 mg/dL (ref 1.7–2.4)

## 2022-09-01 MED ORDER — STERILE WATER FOR INJECTION IJ SOLN
5.0000 mg | Freq: Once | RESPIRATORY_TRACT | Status: DC
  Filled 2022-09-01: qty 5

## 2022-09-01 MED ORDER — IOHEXOL 350 MG/ML SOLN
75.0000 mL | Freq: Once | INTRAVENOUS | Status: AC | PRN
  Administered 2022-09-01: 75 mL via INTRAVENOUS

## 2022-09-01 MED ORDER — PIPERACILLIN-TAZOBACTAM 3.375 G IVPB: 3.3750 g | Freq: Three times a day (TID) | INTRAVENOUS | 0 refills | Status: DC

## 2022-09-01 MED ORDER — STERILE WATER FOR INJECTION IJ SOLN
5.0000 mg | Freq: Once | RESPIRATORY_TRACT | Status: AC
  Administered 2022-09-01: 5 mg via INTRAPLEURAL
  Filled 2022-09-01: qty 5

## 2022-09-01 MED ORDER — OXYCODONE HCL 5 MG PO TABS
5.0000 mg | ORAL_TABLET | ORAL | Status: DC | PRN
  Administered 2022-09-01 – 2022-09-02 (×6): 5 mg via ORAL
  Filled 2022-09-01 (×7): qty 1

## 2022-09-01 MED ORDER — SODIUM CHLORIDE (PF) 0.9 % IJ SOLN
5.0000 mg | Freq: Once | INTRAMUSCULAR | Status: DC
  Filled 2022-09-01: qty 5

## 2022-09-01 MED ORDER — SODIUM CHLORIDE (PF) 0.9 % IJ SOLN
2.5000 mg | Freq: Once | INTRAMUSCULAR | Status: DC
  Filled 2022-09-01: qty 2.5

## 2022-09-01 MED ORDER — SODIUM CHLORIDE (PF) 0.9 % IJ SOLN
2.5000 mg | Freq: Once | INTRAMUSCULAR | Status: AC
  Administered 2022-09-01: 2.5 mg via INTRAPLEURAL
  Filled 2022-09-01 (×2): qty 2.5

## 2022-09-01 NOTE — Progress Notes (Signed)
This RN removed US guided left arm PIV. Redness extending from insertion site up to [facility]. IV team to bedside to place new Right upper arm PIV. Site where left arm PIV removed clean, dry and intact. IV team RN verbally expressed to this RN that site appeared to have possible phlebitis.

## 2022-09-01 NOTE — Assessment & Plan Note (Signed)
ALT and total bilirubin elevated on admission.  Recent right upper quadrant ultrasound  and CT abdomen/pelvis were within normal limits.  LFTs improving now 

## 2022-09-01 NOTE — Progress Notes (Addendum)
NAME:  Carl Le, MRN:  [id number], DOB:  07-20-1981, LOS: 2 ADMISSION DATE:  08/30/2022, CONSULTATION DATE:  08/30/2022 REFERRING MD:  Max Sane, MD , CHIEF COMPLAINT:  Right sided parapneumonic effusion   History of Present Illness:   41 yo M presenting to [facility] ED on 08/30/22 with complaints of 3-4 days of sharp pleuritic right sided chest pain. He admits to an associated cough with some shortness of breath. He denies fever/chills, vomiting or diarrhea. Taking deep breaths exacerbates his pain and nothing has improved his symptoms.  Of note he was seen 3 days prior to presentation at the [facility] ED and was diagnosed with a pneumonia after CTa imaging. He was sent home with Augmentin and doxycycline with which he reports being compliant. He did not notice any improvement in his symptoms. He denies pain or swelling in his legs. He does report smoking a pack a day.   In the ED, he was noted to have a right sided infiltrate on CXR with an elevated white count of 25.5K. Antibiotics were administered and patient was admitted to medicine. TRH contacted pulmonary overnight for the evaluation of a possible pleural effusion. On [place], there appeared to be an effusion in place, and this was confirmed with a contrasted CT scan of the chest. An ultrasound guided pig-tail catheter was placed in the right pleural space with pleural fluid consistent with a para-pneumonic effusion.  Pertinent  Medical History  Kidney stone Herpes Zoster Current Everyday smoker  Significant Hospital Events: Including procedures, antibiotic start and stop dates in addition to other pertinent events   08/30/2022: chest tube placed under ultrasound guidance 08/31/2022: started tpa/DNAse, discussed case with thoracic surgery at [facility]. Recommended tpa/dnase and reimage prior to consideration of a VATS washout. 10 mg tPA/5 mg Dnase instilled in pleural space.  Interim History / Subjective:  Right sided chest wall pain and  muscle spasms persist but are significantly improved. Patient is more interactive today.  Objective   Blood pressure (!) 125/48, pulse (!) 110, temperature 98.4 F (36.9 C), temperature source Oral, resp. rate (!) 28, height 6' (1.829 m), weight 77.1 kg, SpO2 95 %.    FiO2 (%):  [30 %] 30 %   Intake/Output Summary (Last 24 hours) at 09/01/2022 1038 Last data filed at 09/01/2022 1006 Gross per 24 hour  Intake 1525.97 ml  Output 2070 ml  Net -544.03 ml    Filed Weights   08/30/22 1550  Weight: 77.1 kg    Examination: Physical Exam Vitals reviewed.  Constitutional:      General: He is in acute distress.     Appearance: He is well-developed. He is not ill-appearing.  HENT:     Head: Normocephalic.  Eyes:     Extraocular Movements: Extraocular movements intact.     Pupils: Pupils are equal, round, and reactive to light.  Cardiovascular:     Rate and Rhythm: Regular rhythm. Tachycardia present.     Pulses: Normal pulses.     Heart sounds: Normal heart sounds.  Pulmonary:     Effort: Respiratory distress present.     Breath sounds: Rales (over the right base) present.  Abdominal:     Palpations: Abdomen is soft.  Musculoskeletal:     Right lower leg: No edema.     Left lower leg: No edema.  Neurological:     General: No focal deficit present.     Mental Status: He is alert and oriented to person, place, and time.  Assessment & Plan:   #Community Acquired Pneumonia of the RLL #Complicated Para-pneumonic effusion  40 year old male presenting with cough, leukocytosis, and a right sided infiltrate on CXR. Confirmed to be a lobar pneumonia on CT with a surrounding parapneumonic effusion. On POCUS there was a right sided pleural effusion with underlying collapse/consolidation. Overall picture highly concerning for community acquired pneumonia with parapneumonic effusion for which tube thoracostomy was performed under ultrasound guidance. Pleural fluid was cloudy and  thick (LDH 938, 37K white cells with 96% neutrophils, gram stain negative, culture pending). Appearance and LDH suggest a complicated para-pneumonic effusion (2 points on the RAPID score suggesting low risk of mortality).   Plan is to instill intra-pleural lytics to help break down and clear his effusion. He received a full dose of lytics yesterday, with subsequent pleural fluid turning serosanguinous. We held off on the PM dose of the lytics and obtained a chest CT this morning that on my review is showing an anterior loculated pleural effusion that is not connecting to the posterior pocket. I have reached out to thoracic surgery at [facility] thoracic surgery again today and asked they review imaging and assess candidacy for VATS washout. In the meantime, I will proceed with intra-pleural lytics, albeit with dose adjustment given serosanguinous nature of the fluid. Will administer 2.5 mg of tPA and 5 mg DNAse per the ADAPT-2 trial (Respirology. 2022 Jul;27(7):510-516). Will continue q6hour intra-pleural saline flushes and broad spectrum antibiotics pending cultures and await response from [facility] thoracic surgery. Our team will continue to follow along.  Raechel Chute, MD [facility]  Labs   CBC: Recent Labs  Lab 08/30/22 1616 08/31/22 0255 09/01/22 0510  WBC 25.5* 22.1* 24.3*  NEUTROABS 23.0* 19.1*  --   HGB 16.2 14.0 13.9  HCT 47.7 41.9 41.1  MCV 87.4 87.7 86.5  PLT 362 288 277     Basic Metabolic Panel: Recent Labs  Lab 08/30/22 1616 08/31/22 0255 09/01/22 0510  NA 135 135 130*  K 4.7 3.7 4.1  CL 103 106 98  CO2 20* 21* 23  GLUCOSE 150* 116* 159*  BUN 13 15 15   CREATININE 0.82 0.78 0.79  CALCIUM 9.5 7.8* 8.4*  MG  --  1.4* 2.0  PHOS  --  2.4* 2.4*    GFR: Estimated Creatinine Clearance: 135.2 mL/min (by C-G formula based on SCr of 0.79 mg/dL). Recent Labs  Lab 08/30/22 1616 08/30/22 1829 08/31/22 0015 08/31/22 0255 09/01/22 0510  PROCALCITON  --    --   --  2.15  --   WBC 25.5*  --   --  22.1* 24.3*  LATICACIDVEN 2.0* 2.2* 1.5 1.1  --      Liver Function Tests: Recent Labs  Lab 08/30/22 1616 08/31/22 0015 08/31/22 0255  AST 38  --  21  ALT 82*  --  46*  ALKPHOS 104  --  70  BILITOT 1.9*  --  2.0*  PROT 8.6*  --  6.1*  ALBUMIN 4.3 3.5 3.1*    No results for input(s): "LIPASE", "AMYLASE" in the last 168 hours. No results for input(s): "AMMONIA" in the last 168 hours.  ABG    Component Value Date/Time   PHART 7.43 08/30/2022 2131   PCO2ART 36 08/30/2022 2131   PO2ART 69 (L) 08/30/2022 2131   HCO3 23.9 08/30/2022 2131   ACIDBASEDEF 0.1 08/30/2022 2131   O2SAT 97.3 08/30/2022 2131     Coagulation Profile: No results for input(s): "INR", "PROTIME" in  the last 168 hours.  Cardiac Enzymes: No results for input(s): "CKTOTAL", "CKMB", "CKMBINDEX", "TROPONINI" in the last 168 hours.  HbA1C: Hgb A1c MFr Bld  Date/Time Value Ref Range Status  08/31/2022 02:55 AM 4.7 (L) 4.8 - 5.6 % Final    Comment:    (NOTE) Pre diabetes:          5.7%-6.4%  Diabetes:              >6.4%  Glycemic control for   <7.0% adults with diabetes     CBG: Recent Labs  Lab 08/31/22 1609 08/31/22 1922 08/31/22 2307 09/01/22 0330 09/01/22 0737  GLUCAP 112* 123* 144* 116* 110*     Past Medical History:  He,  has a past medical history of Current smoker, Herpes zoster, Kidney calculus, and Mild emphysema (HCC) (08/30/2022).   Surgical History:  History reviewed. No pertinent surgical history.   Social History:   reports that he has been smoking cigarettes. He has been smoking an average of 1 pack per day. He does not have any smokeless tobacco history on file. He reports current alcohol use. 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   Family History:  His family history includes Heart attack in his brother and father.   Allergies Not on File   Home Medications  Prior to  Admission medications   Medication Sig Start Date End Date Taking? Authorizing Provider  amoxicillin-clavulanate (AUGMENTIN) 875-125 MG tablet Take 1 tablet by mouth 2 (two) times daily. 08/27/22  Yes [provider]  doxycycline (VIBRAMYCIN) 100 MG capsule Take 100 mg by mouth 2 (two) times daily. 08/27/22  Yes [provider]  HYDROcodone bit-homatropine (HYCODAN) 5-1.5 MG/5ML syrup Take 5 mLs by mouth every 6 (six) hours as needed. 08/27/22  Yes [provider]     Total care time: 37 minutes

## 2022-09-01 NOTE — Assessment & Plan Note (Signed)
Likely in the setting of uncontrolled pain.  - Telemetry monitoring - On morphine PCA and fentanyl, oral oxycodone as needed for pain control.  Seem to help 

## 2022-09-01 NOTE — Assessment & Plan Note (Signed)
CTA chest obtained on 10/22 demonstrated small right pleural effusion.  Patient presented with worsening symptoms with chest x-ray demonstrating enlarging pleural effusion consistent with CAP complicated by either para-pneumonic effusion or empyema.  CT confirmed parapneumonic effusion  - Continue Zosyn, azithromycin.  MRSA PCR negative.  Stop vancomycin

## 2022-09-01 NOTE — Evaluation (Addendum)
Physical Therapy Evaluation Patient Details Name: Carl Le MRN: [id number] DOB: [birth year] Today's Date: 09/01/2022  History of Present Illness  Pt is a 41 y/o M admitted on 08/30/22 for CAP of RLL & was found to ahve complicated parapneumonic effusion. Pt had chest tube placed 08/30/22, underwent pleural fibrinolysis 08/31/22. Imaging negative for PE on 09/01/22.  Clinical Impression  MD cleared pt for participation. Pt seen for PT evaluation with pt agreeable despite c/o significant R side chest pain -- nurse aware & administered meds during session. Prior to admission pt was independent without AD, driving, & working. On this date, pt is able to complete STS independently, supervision for step pivot to recliner without AD. Pt engages in standing & seated exercises & pt agreeable. Pt motivated to participate to prevent weakness while in hospital. During session, MD in room reporting pt will be transferred to [facility] to be evaluated by thoracic surgeon, pt a little discouraged & PT provided education/encouragement. Pt does endorse SOB after sitting in recliner but limited with pursed lip breathing 2/2 pain. Will continue to follow pt acutely to address endurance/activity tolerance, balance, and gait with LRAD.      Recommendations for follow up therapy are one component of a multi-disciplinary discharge planning process, led by the attending physician.  Recommendations may be updated based on patient status, additional functional criteria and insurance authorization.  Follow Up Recommendations Outpatient PT Can patient physically be transported by private vehicle: Yes    Assistance Recommended at Discharge Intermittent Supervision/Assistance  Patient can return home with the following  A little help with walking and/or transfers;A little help with bathing/dressing/bathroom;Assist for transportation;Help with stairs or ramp for entrance;Assistance with cooking/housework    Equipment  Recommendations None recommended by PT  Recommendations for Other Services  OT consult    Functional Status Assessment Patient has had a recent decline in their functional status and demonstrates the ability to make significant improvements in function in a reasonable and predictable amount of time.     Precautions / Restrictions Precautions Precautions: Fall Precaution Comments: R chest tube Restrictions Weight Bearing Restrictions: No      Mobility  Bed Mobility               General bed mobility comments: pt received & left sitting EOB    Transfers     Transfers: Sit to/from Stand, Bed to chair/wheelchair/BSC Sit to Stand: Modified independent (Device/Increase time)   Step pivot transfers: Supervision       General transfer comment: PT managed lines/IV    Ambulation/Gait                  Stairs            Wheelchair Mobility    Modified Rankin (Stroke Patients Only)       Balance Overall balance assessment: Mild deficits observed, not formally tested   Sitting balance-Leahy Scale: Good     Standing balance support: During functional activity, No upper extremity supported Standing balance-Leahy Scale: Good                               Pertinent Vitals/Pain Pain Assessment Pain Assessment: Faces Faces Pain Scale: Hurts worst Pain Location: R chest Pain Descriptors / Indicators: Grimacing, Discomfort Pain Intervention(s): Monitored during session, Limited activity within patient's tolerance, RN gave pain meds during session    Home Living Family/patient expects to be discharged to:: Private residence  Living Arrangements:  (sister) Available Help at Discharge: Family;Available PRN/intermittently Type of Home: House Home Access: Stairs to enter Entrance Stairs-Rails: None Entrance Stairs-Number of Steps: 3   Home Layout: One level Home Equipment: None      Prior Function Prior Level of Function :  Independent/Modified Independent;Working/employed;Driving             Mobility Comments: Independent without AD, driving, 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       Hand Dominance        Extremity/Trunk Assessment   Upper Extremity Assessment Upper Extremity Assessment: Overall WFL for tasks assessed    Lower Extremity Assessment Lower Extremity Assessment: Generalized weakness       Communication   Communication: No difficulties  Cognition Arousal/Alertness: Awake/alert Behavior During Therapy: WFL for tasks assessed/performed Overall Cognitive Status: Within Functional Limits for tasks assessed                                          General Comments General comments (skin integrity, edema, etc.): Pt on 4L/min via nasal cannula, SpO2 >90% despite pt c/o SOB after transferring to recliner, PT educated pt on pursed lip breathing but pt reports it's difficult 2/2 pain    Exercises Other Exercises Other Exercises: Pt performed 5x standing marches with 1UE support & supervision, seated hip adduction pillow squeezes x 5. Other Exercises: Educated pt on LAQ with 5 second hold, STS repetitions for BLE strengthening. Other Exercises: Encouraged pt to stand/perform standing exercises when nursing staff is in room & able to assist with line management.   Assessment/Plan    PT Assessment Patient needs continued PT services  PT Problem List Decreased strength;Pain;Decreased activity tolerance;Decreased balance;Decreased mobility       PT Treatment Interventions Therapeutic exercise;DME instruction;Gait training;Balance training;Stair training;Neuromuscular re-education;Functional mobility training;Therapeutic activities;Patient/family education    PT Goals (Current goals can be found in the Care Plan section)  Acute Rehab PT Goals Patient Stated Goal: decreased pain, get better PT Goal Formulation: With patient Time For Goal Achievement:  09/15/22 Potential to Achieve Goals: Good    Frequency Min 2X/week     Co-evaluation               AM-PAC PT "6 Clicks" Mobility  Outcome Measure Help needed turning from your back to your side while in a flat bed without using bedrails?: None Help needed moving from lying on your back to sitting on the side of a flat bed without using bedrails?: A Little Help needed moving to and from a bed to a chair (including a wheelchair)?: A Little Help needed standing up from a chair using your arms (e.g., wheelchair or bedside chair)?: None Help needed to walk in hospital room?: A Little Help needed climbing 3-5 steps with a railing? : A Lot 6 Click Score: 19    End of Session Equipment Utilized During Treatment: Oxygen Activity Tolerance: Patient limited by pain Patient left: in chair;with call bell/phone within reach;with nursing/sitter in room Nurse Communication: Mobility status PT Visit Diagnosis: Muscle weakness (generalized) (M62.81);Pain Pain - Right/Left: Right Pain - part of body:  (abdomen/chest)    Time: 7001-7494 PT Time Calculation (min) (ACUTE ONLY): 22 min   Charges:   PT Evaluation $PT Eval High Complexity: 1 High          Aleda Grana, PT, DPT 09/01/22, 12:31 PM  Sandi Mariscal 09/01/2022, 12:28 PM

## 2022-09-01 NOTE — Assessment & Plan Note (Signed)
Seen on CT scan.  On POCUS he had right-sided pleural effusion with underlying collapse/consolidation so underwent ultrasound-guided tube thoracostomy yesterday.  Underwent intrapleural fibrinolytic therapy with tPA/DNase. Continue broad-spectrum antibiotics PCCM discussed the case with thoracic surgery at [facility] who recommends transfer for to [facility] now.  Waiting for bed

## 2022-09-01 NOTE — Progress Notes (Signed)
PT Cancellation Note  Patient Details Name: Carl Le MRN: [id number] DOB: 1981/04/13   Cancelled Treatment:    Reason Eval/Treat Not Completed: Patient not medically ready PT orders received, chart reviewed. Pt noted to have orders for imaging to r/o PE. Will hold PT evaluation until results are in & pt is cleared to mobilize with therapy.  Lavone Nian, PT, DPT 09/01/22, 8:31 AM   Waunita Schooner 09/01/2022, 8:30 AM

## 2022-09-01 NOTE — Progress Notes (Signed)
  Progress Note   Patient: MERLIN GOLDEN NKN:[id number] DOB: 05/14/1981 DOA: 08/30/2022     2 DOS: the patient was seen and examined on 09/01/2022   Brief hospital course: 41 year old male admitted for community acquired pneumonia of right lower lobe and was found to have complicated parapneumonic effusion  10/25: Status post chest tube placement under ultrasound guidance for parapneumonic effusion 10/26: pleural fibrinolysis per PCCM team 10/27: waiting for [facility] transfer for CT surgery eval   Assessment and Plan: * CAP (community acquired pneumonia) CTA chest obtained on 10/22 demonstrated small right pleural effusion.  Patient presented with worsening symptoms with chest x-ray demonstrating enlarging pleural effusion consistent with CAP complicated by either para-pneumonic effusion or empyema.  CT confirmed parapneumonic effusion  - Continue Zosyn, azithromycin.  MRSA PCR negative.  Stop vancomycin   Sinus tachycardia Likely in the setting of uncontrolled pain.  - Telemetry monitoring - On morphine PCA and fentanyl, oral oxycodone as needed for pain control.  Seem to help  LFT elevation ALT and total bilirubin elevated on admission.  Recent right upper quadrant ultrasound  and CT abdomen/pelvis were within normal limits.  LFTs improving now.  Hyperglycemia In the setting of acute illness  - A1c 4.7.  This is not diabetes - No indication for SSI at this time  Parapneumonic effusion Seen on CT scan.  On POCUS he had right-sided pleural effusion with underlying collapse/consolidation so underwent ultrasound-guided tube thoracostomy yesterday.  Underwent intrapleural fibrinolytic therapy with tPA/DNase. Continue broad-spectrum antibiotics PCCM discussed the case with thoracic surgery at [facility] who recommends transfer for to [facility] now.  Waiting for bed        Subjective: Complains of chest wall pain and muscle spasm responding to the pain medications  Physical  Exam: Vitals:   09/01/22 1200 09/01/22 1208 09/01/22 1400 09/01/22 1601  BP: 136/86  (!) 141/86   Pulse: (!) 115  (!) 115   Resp: 20 20 16  (!) 30  Temp: 98 F (36.7 C)     TempSrc: Oral     SpO2: 93% 94% 94% 95%  Weight:      Height:       41 year old male lying in the bed in no acute pain/distress Lungs Rales at the bases right more than the left, chest tube on the right Cardiovascular regular rhythm, tachycardic Abdomen soft, benign Neuro alert and awake, nonfocal Data Reviewed:  WBC 24.3  Family Communication: None  Disposition: Status is: Inpatient Remains inpatient appropriate because: Waiting for [facility] pending bed availability   Planned Discharge Destination: Waiting for transfer to [facility] for CT surgery evaluation    DVT prophylaxis-Lovenox Time spent: 35 minutes  Author: Max Sane, MD 09/01/2022 4:59 PM  For on call review [email or web address].

## 2022-09-01 NOTE — Discharge Summary (Signed)
Physician Discharge Summary   Patient: Carl Le MRN: [id number] DOB: 1981-01-01  Admit date:     08/30/2022  Discharge date: 09/02/22  Discharge Physician: Delfino Lovett   PCP: Pcp, No   Recommendations at discharge:   Please consult [facility] for evaluation once there  Discharge Diagnoses: Principal Problem:   CAP (community acquired pneumonia) Active Problems:   Sinus tachycardia   LFT elevation   Hyperglycemia   Sepsis without acute organ dysfunction (HCC)   Parapneumonic effusion  Hospital Course: 41 year old male admitted for community acquired pneumonia of right lower lobe and was found to have complicated parapneumonic effusion  10/25: Status post chest tube placement under ultrasound guidance for parapneumonic effusion 10/26: pleural fibrinolysis per PCCM team  Assessment and Plan: * CAP (community acquired pneumonia) Parapneumonic effusion CTA chest obtained on 10/22 demonstrated small right pleural effusion.  Patient presented with worsening symptoms with chest x-ray demonstrating enlarging pleural effusion consistent with CAP complicated by either para-pneumonic effusion or empyema.  CT confirmed parapneumonic effusion -Continue IV antibiotics. -On POCUS he had right-sided pleural effusion with underlying collapse/consolidation so underwent ultrasound-guided tube thoracostomy.  Also had intrapleural fibrinolytic therapy with tPA/DNase. Continue broad-spectrum antibiotics PCCM discussed the case with thoracic surgery at [facility] who recommends to transfer to [facility] for further evaluation. Please consult pulmonary and thoracic surgery team once patient is there  Sinus tachycardia Likely in the setting of uncontrolled pain. - Dilaudid and Robaxin for pain control  LFT elevation ALT and total bilirubin elevated on admission.  Recent right upper quadrant ultrasound  and CT abdomen/pelvis were within normal limits.  LFTs improving  now  Hyperglycemia In the setting of acute illness  - A1c 4.7.  This is not diabetes - No indication for SSI at this time        Consultants: PCCM Procedures performed: Chest tube placement Disposition:  [facility] Diet recommendation:  Discharge Diet Orders (From admission, onward)     Start     Ordered   09/01/22 0000  Diet - low sodium heart healthy        09/01/22 1219           Cardiac diet DISCHARGE MEDICATION: Allergies as of 09/01/2022   Not on File      Medication List     STOP taking these medications    amoxicillin-clavulanate 875-125 MG tablet Commonly known as: AUGMENTIN   doxycycline 100 MG capsule Commonly known as: VIBRAMYCIN   HYDROcodone bit-homatropine 5-1.5 MG/5ML syrup Commonly known as: HYCODAN       TAKE these medications    piperacillin-tazobactam 3.375 GM/50ML IVPB Commonly known as: ZOSYN Inject 50 mLs (3.375 g total) into the vein every 8 (eight) hours.        Discharge Exam: Filed Weights   08/30/22 1550  Weight: 15.31 kg   41 year old male lying in the bed in no acute pain/distress Lungs Rales at the bases right more than the left, chest tube on the right Cardiovascular regular rhythm, tachycardic Abdomen soft, benign Neuro alert and awake, nonfocal  Condition at discharge: fair  The results of significant diagnostics from this hospitalization (including imaging, microbiology, ancillary and laboratory) are listed below for reference.   Imaging Studies: CT Angio Chest Pulmonary Embolism (PE) W or WO Contrast  Result Date: 09/01/2022 CLINICAL DATA:  Pneumonia follow-up status post chest tube for parapneumonic effusion. Shortness of breath. EXAM: CT ANGIOGRAPHY CHEST WITH CONTRAST TECHNIQUE: Multidetector CT imaging of the chest was performed  using the standard protocol during bolus administration of intravenous contrast. Multiplanar CT image reconstructions and MIPs were obtained to evaluate the  vascular anatomy. RADIATION DOSE REDUCTION: This exam was performed according to the departmental dose-optimization program which includes automated exposure control, adjustment of the mA and/or kV according to patient size and/or use of iterative reconstruction technique. CONTRAST:  36mL OMNIPAQUE IOHEXOL 350 MG/ML SOLN COMPARISON:  CT chest dated August 30, 2022. FINDINGS: Cardiovascular: Satisfactory opacification of the pulmonary arteries to the segmental level. No evidence of pulmonary embolism. Normal heart size. No pericardial effusion. No thoracic aortic aneurysm or dissection. Mediastinum/Nodes: No pathologically enlarged mediastinal, hilar, or axillary lymph nodes. Prominent subcentimeter mediastinal lymph nodes again noted, likely reactive. Thyroid gland, trachea, and esophagus demonstrate no significant findings. Lungs/Pleura: New posterior approach pigtail chest tube at the right lung base. Overall amount of loculated pleural fluid has decreased compared to the prior study 2 days ago, although there is more fluid loculated anteriorly around the right middle lobe compared to the prior. There is a new small pneumothorax component posteriorly as well. No pleural enhancement. The right middle and lower lobes are now completely collapsed with debris and obstruction of the central right middle and lower lobe bronchi. Additional new areas of hypoenhancement centrally with air bronchograms in the right middle and lower lobes. New atelectasis in the anterior inferior right upper lobe. Unchanged mild atelectasis in the left lower lobe. Unchanged mild centrilobular and paraseptal emphysema. Upper Abdomen: No acute abnormality. Musculoskeletal: No chest wall abnormality. Unchanged elevated right hemidiaphragm. No acute or significant osseous findings. Review of the MIP images confirms the above findings. IMPRESSION: 1. No evidence of pulmonary embolism. 2. New posterior approach pigtail chest tube at the right  lung base with new small hydropneumothorax. Overall amount of loculated pleural fluid has decreased compared to the prior study 2 days ago. No pleural enhancement. 3. New complete collapse of the right middle and lower lobes with debris in obstruction of the central bronchi and new areas of hypoenhancement centrally in both lobes, concerning for pneumonia. 4.  Emphysema (ICD10-J43.9). Electronically Signed   By: Obie Dredge M.D.   On: 09/01/2022 10:00   DG Chest Port 1 View  Result Date: 09/01/2022 CLINICAL DATA:  41 year old male with loculated right pleural effusion, chest tube. EXAM: PORTABLE CHEST 1 VIEW COMPARISON:  Portable chest 08/31/2022 and earlier. FINDINGS: Portable AP upright view at 0550 hours. Right side pigtail pleural catheter remains in place. Opacified lower half of the right hemithorax is unchanged. A combination of pleural fluid and consolidation was demonstrated by CT 2 days ago. No pneumothorax. Mildly lower lung volumes overall, with new patchy left lung base opacity. Visible mediastinal contours are stable. Visualized tracheal air column is within normal limits. No acute osseous abnormality identified. Negative visible bowel gas. IMPRESSION: 1. Stable right pleural catheter with ongoing opacification of the lower half of the right hemithorax, possibly combined residual pleural fluid and consolidation. No pneumothorax. 2. Lower lung volumes with new patchy left lung base opacity, indeterminate for atelectasis versus bilateral infection. Electronically Signed   By: Odessa Fleming M.D.   On: 09/01/2022 06:21   DG Chest Port 1 View  Result Date: 08/31/2022 CLINICAL DATA:  Chest tube in place EXAM: PORTABLE CHEST 1 VIEW COMPARISON:  None Available. FINDINGS: Normal cardiac silhouette. Elevation of the RIGHT hemidiaphragm with RIGHT basilar atelectasis and effusion. RIGHT chest tube in place. No pneumothorax. IMPRESSION: 1. No significant change. 2. RIGHT basilar atelectasis and effusion  with  elevated RIGHT hemidiaphragm and chest tube in place. Electronically Signed   By: Genevive Bi M.D.   On: 08/31/2022 08:54   DG Chest Port 1 View  Result Date: 08/31/2022 CLINICAL DATA:  Follow-up right chest tube, initial encounter EXAM: PORTABLE CHEST 1 VIEW COMPARISON:  08/30/2022 FINDINGS: Cardiac shadow is within normal limits. Lungs are well aerated bilaterally. Bilateral consolidation is noted right greater than left. Pigtail catheter is noted on the right with significant decrease in the right-sided pleural effusion. No bony abnormality is noted. IMPRESSION: Bibasilar consolidation right greater than left. No pneumothorax following chest tube placement. Right effusion has reduced significantly. Electronically Signed   By: Alcide Clever M.D.   On: 08/31/2022 00:45   CT CHEST W CONTRAST  Result Date: 08/30/2022 CLINICAL DATA:  Pneumonia, complication suspected, xray done Concern for empyema. EXAM: CT CHEST WITH CONTRAST TECHNIQUE: Multidetector CT imaging of the chest was performed during intravenous contrast administration. RADIATION DOSE REDUCTION: This exam was performed according to the departmental dose-optimization program which includes automated exposure control, adjustment of the mA and/or kV according to patient size and/or use of iterative reconstruction technique. CONTRAST:  82mL OMNIPAQUE IOHEXOL 300 MG/ML  SOLN COMPARISON:  Chest x-ray 08/30/2022 FINDINGS: Cardiovascular: Normal heart size. No significant pericardial effusion. The thoracic aorta is normal in caliber. No atherosclerotic plaque of the thoracic aorta. No coronary artery calcifications. Mediastinum/Nodes: No enlarged mediastinal, hilar, or axillary lymph nodes. Thyroid gland, trachea, and esophagus demonstrate no significant findings. Lungs/Pleura: Mild centrilobular emphysematous changes. Trace biapical pleural/pulmonary scarring. Passive atelectasis of the right lower and middle lobe. Elevated right hemidiaphragm.  Elevated left hemidiaphragm with left basilar atelectasis. No focal consolidation. No pulmonary nodule. No pulmonary mass. Loculated trace to small volume right pleural effusion. No pleural thickening or enhancement to suggest empyema. No pneumothorax. Upper Abdomen:  No acute abnormality. Musculoskeletal: No chest wall abnormality. No suspicious lytic or blastic osseous lesions. No acute displaced fracture. IMPRESSION: Loculated trace to small volume right pleural effusion. No pleural thickening or enhancement to suggest empyema. Associated passive atelectasis of the right lower and middle lobe Electronically Signed   By: Tish Frederickson M.D.   On: 08/30/2022 22:20   DG Chest Portable 1 View  Result Date: 08/30/2022 CLINICAL DATA:  Chest pain due to spasms and worsening pneumonia, on antibiotics since Sunday with no relief, pain rated at 10/10 EXAM: PORTABLE CHEST 1 VIEW COMPARISON:  05/11/2014 FINDINGS: Normal heart size and mediastinal contours. Decreased lung volumes versus previous exam. RIGHT basilar infiltrate and associated pleural effusion. Remaining lungs clear. No pneumothorax or acute osseous findings. IMPRESSION: RIGHT basilar infiltrate consistent with pneumonia. Associated RIGHT parapneumonic pleural effusion. Electronically Signed   By: Ulyses Southward M.D.   On: 08/30/2022 16:22    Microbiology: Results for orders placed or performed during the hospital encounter of 08/30/22  Culture, blood (routine x 2)     Status: None (Preliminary result)   Collection Time: 08/30/22  4:16 PM   Specimen: BLOOD RIGHT HAND  Result Value Ref Range Status   Specimen Description BLOOD RIGHT HAND  Final   Special Requests   Final    BOTTLES DRAWN AEROBIC AND ANAEROBIC Blood Culture adequate volume   Culture   Final    NO GROWTH 2 DAYS Performed at [facility], [address], [place], [place] [address]    Report Status PENDING  Incomplete  Culture, blood (routine x 2)     Status: None  (Preliminary result)   Collection Time: 08/30/22  4:16 PM   Specimen: Right Antecubital; Blood  Result Value Ref Range Status   Specimen Description RIGHT ANTECUBITAL  Final   Special Requests   Final    BOTTLES DRAWN AEROBIC AND ANAEROBIC Blood Culture results may not be optimal due to an excessive volume of blood received in culture bottles   Culture   Final    NO GROWTH 2 DAYS Performed at [facility], [address] [place], [place] [address]    Report Status PENDING  Incomplete  SARS Coronavirus 2 by RT PCR (hospital order, performed in [facility] hospital lab) *cepheid single result test* Anterior Nasal Swab     Status: None   Collection Time: 08/30/22  4:55 PM   Specimen: Anterior Nasal Swab  Result Value Ref Range Status   SARS Coronavirus 2 by RT PCR NEGATIVE NEGATIVE Final    Comment: (NOTE) SARS-CoV-2 target nucleic acids are NOT DETECTED.  The SARS-CoV-2 RNA is generally detectable in upper and lower respiratory specimens during the acute phase of infection. The lowest concentration of SARS-CoV-2 viral copies this assay can detect is 250 copies / mL. A negative result does not preclude SARS-CoV-2 infection and should not be used as the sole basis for treatment or other patient management decisions.  A negative result may occur with improper specimen collection / handling, submission of specimen other than nasopharyngeal swab, presence of viral mutation(s) within the areas targeted by this assay, and inadequate number of viral copies (<250 copies / mL). A negative result must be combined with clinical observations, patient history, and epidemiological information.  Fact Sheet for Patients:   RoadLapTop.co.zahttps://www.fda.gov/media/158405/download  Fact Sheet for Healthcare Providers: http://kim-miller.com/https://www.fda.gov/media/158404/download  This test is not yet approved or  cleared by the [place] FDA and has been authorized for detection and/or diagnosis of SARS-CoV-2 by FDA  under an Emergency Use Authorization (EUA).  This EUA will remain in effect (meaning this test can be used) for the duration of the COVID-19 declaration under Section 564(b)(1) of the Act, 21 U.S.C. section 360bbb-3(b)(1), unless the authorization is terminated or revoked sooner.  Performed at [facility], [address] [place], [place] [address]   MRSA Next Gen by PCR, Nasal     Status: None   Collection Time: 08/31/22 12:15 AM   Specimen: Nasal Mucosa; Nasal Swab  Result Value Ref Range Status   MRSA by PCR Next Gen NOT DETECTED NOT DETECTED Final    Comment: (NOTE) The GeneXpert MRSA Assay (FDA approved for NASAL specimens only), is one component of a comprehensive MRSA colonization surveillance program. It is not intended to diagnose MRSA infection nor to guide or monitor treatment for MRSA infections. Test performance is not FDA approved in patients less than 39 years old. Performed at [facility], [address] [place], [place] [address]   Pleural fluid culture w Gram Stain     Status: None (Preliminary result)   Collection Time: 08/31/22 12:35 AM   Specimen: Pleural Fluid  Result Value Ref Range Status   Specimen Description   Final    PLEURAL Performed at [facility], [address] [place], [place] [address]    Special Requests   Final    NONE Performed at [facility], [address] [place], [place] [address]    Gram Stain   Final    MODERATE WBC PRESENT, PREDOMINANTLY PMN NO ORGANISMS SEEN    Culture   Final    NO GROWTH 1 DAY Performed  at [facility], [place] [address], [place], [place] [address]    Report Status PENDING  Incomplete    Labs: CBC: Recent Labs  Lab 08/30/22 1616 08/31/22 0255 09/01/22 0510  WBC 25.5* 22.1* 24.3*  NEUTROABS 23.0* 19.1*  --   HGB 16.2 14.0 13.9  HCT 47.7 41.9 41.1  MCV 87.4 87.7 86.5  PLT 362 288 354   Basic Metabolic Panel: Recent Labs  Lab 08/30/22 1616  08/31/22 0255 09/01/22 0510  NA 135 135 130*  K 4.7 3.7 4.1  CL 103 106 98  CO2 20* 21* 23  GLUCOSE 150* 116* 159*  BUN 13 15 15   CREATININE 0.82 0.78 0.79  CALCIUM 9.5 7.8* 8.4*  MG  --  1.4* 2.0  PHOS  --  2.4* 2.4*   Liver Function Tests: Recent Labs  Lab 08/30/22 1616 08/31/22 0015 08/31/22 0255  AST 38  --  21  ALT 82*  --  46*  ALKPHOS 104  --  70  BILITOT 1.9*  --  2.0*  PROT 8.6*  --  6.1*  ALBUMIN 4.3 3.5 3.1*   CBG: Recent Labs  Lab 08/31/22 1922 08/31/22 2307 09/01/22 0330 09/01/22 0737 09/01/22 1112  GLUCAP 123* 144* 116* 110* 111*    Discharge time spent: greater than 30 minutes.  Signed: Max Sane, MD [facility] 09/01/2022

## 2022-09-01 NOTE — Progress Notes (Signed)
PCA pump demands verified by this RN and RN Sheleigh totaling 16 demands during this shift.

## 2022-09-01 NOTE — Progress Notes (Signed)
1130 Patient chest tubed unclamped per MD Dgayli order set. System set to 20 suction. Verified by CN RN Maudie Mercury. Patient administered all PRN medications as documented.

## 2022-09-01 NOTE — Procedures (Signed)
Pleural Fibrinolytic Administration Procedure Note  Carl Le  [id number]  03/26/1981  Date:09/01/22  Time:10:37 AM   Provider Performing:Steffon Gladu   Procedure: Pleural Fibrinolysis Subsequent day [phone])  Indication(s) Fibrinolysis of complicated pleural effusion  Consent Risks of the procedure as well as the alternatives and risks of each were explained to the patient and/or caregiver.  Consent for the procedure was obtained.   Anesthesia None   Time Out Verified patient identification, verified procedure, site/side was marked, verified correct patient position, special equipment/implants available, medications/allergies/relevant history reviewed, required imaging and test results available.   Sterile Technique Hand hygiene, gloves   Procedure Description Existing pleural catheter was cleaned and accessed in sterile manner. 2.5 mg of tPA in 30cc of saline and 5mg  of dornase in 30cc of sterile water were injected into pleural space using existing pleural catheter.  Catheter will be clamped for 1 hour and then placed to suction.   Complications/Tolerance None; patient tolerated the procedure well.  EBL None   Specimen(s) None

## 2022-09-02 ENCOUNTER — Inpatient Hospital Stay (HOSPITAL_COMMUNITY)
Admission: RE | Admit: 2022-09-02 | Discharge: 2022-09-07 | DRG: 193 | Disposition: A | Discharge status: Home / Self Care | Payer: BLUE CROSS/BLUE SHIELD | Source: Other Acute Inpatient Hospital | Attending: Internal Medicine | Admitting: Internal Medicine

## 2022-09-02 ENCOUNTER — Inpatient Hospital Stay: Payer: BLUE CROSS/BLUE SHIELD

## 2022-09-02 DIAGNOSIS — Z8249 Family history of ischemic heart disease and other diseases of the circulatory system: Secondary | ICD-10-CM | POA: Diagnosis not present

## 2022-09-02 DIAGNOSIS — Z720 Tobacco use: Secondary | ICD-10-CM

## 2022-09-02 DIAGNOSIS — D649 Anemia, unspecified: Secondary | ICD-10-CM | POA: Diagnosis present

## 2022-09-02 DIAGNOSIS — J439 Emphysema, unspecified: Secondary | ICD-10-CM | POA: Diagnosis present

## 2022-09-02 DIAGNOSIS — A419 Sepsis, unspecified organism: Secondary | ICD-10-CM | POA: Diagnosis not present

## 2022-09-02 DIAGNOSIS — F419 Anxiety disorder, unspecified: Secondary | ICD-10-CM | POA: Diagnosis present

## 2022-09-02 DIAGNOSIS — R7989 Other specified abnormal findings of blood chemistry: Secondary | ICD-10-CM | POA: Diagnosis not present

## 2022-09-02 DIAGNOSIS — Z886 Allergy status to analgesic agent status: Secondary | ICD-10-CM | POA: Diagnosis not present

## 2022-09-02 DIAGNOSIS — R000 Tachycardia, unspecified: Secondary | ICD-10-CM | POA: Diagnosis present

## 2022-09-02 DIAGNOSIS — E871 Hypo-osmolality and hyponatremia: Secondary | ICD-10-CM | POA: Diagnosis present

## 2022-09-02 DIAGNOSIS — F17210 Nicotine dependence, cigarettes, uncomplicated: Secondary | ICD-10-CM | POA: Diagnosis present

## 2022-09-02 DIAGNOSIS — J9601 Acute respiratory failure with hypoxia: Secondary | ICD-10-CM | POA: Diagnosis present

## 2022-09-02 DIAGNOSIS — Z20822 Contact with and (suspected) exposure to covid-19: Secondary | ICD-10-CM | POA: Diagnosis present

## 2022-09-02 DIAGNOSIS — J189 Pneumonia, unspecified organism: Principal | ICD-10-CM

## 2022-09-02 DIAGNOSIS — J918 Pleural effusion in other conditions classified elsewhere: Secondary | ICD-10-CM | POA: Diagnosis present

## 2022-09-02 DIAGNOSIS — R739 Hyperglycemia, unspecified: Secondary | ICD-10-CM | POA: Diagnosis present

## 2022-09-02 DIAGNOSIS — J90 Pleural effusion, not elsewhere classified: Secondary | ICD-10-CM | POA: Diagnosis not present

## 2022-09-02 DIAGNOSIS — J869 Pyothorax without fistula: Secondary | ICD-10-CM | POA: Diagnosis present

## 2022-09-02 HISTORY — DX: Tobacco use: Z72.0

## 2022-09-02 LAB — GLUCOSE, CAPILLARY
Glucose-Capillary: 139 mg/dL — ABNORMAL HIGH (ref 70–99)
Glucose-Capillary: 145 mg/dL — ABNORMAL HIGH (ref 70–99)
Glucose-Capillary: 149 mg/dL — ABNORMAL HIGH (ref 70–99)
Glucose-Capillary: 150 mg/dL — ABNORMAL HIGH (ref 70–99)
Glucose-Capillary: 151 mg/dL — ABNORMAL HIGH (ref 70–99)
Glucose-Capillary: 97 mg/dL (ref 70–99)

## 2022-09-02 MED ORDER — STERILE WATER FOR INJECTION IJ SOLN
5.0000 mg | Freq: Once | RESPIRATORY_TRACT | Status: AC
  Administered 2022-09-02: 5 mg via INTRAPLEURAL
  Filled 2022-09-02: qty 5

## 2022-09-02 MED ORDER — ENOXAPARIN SODIUM 40 MG/0.4ML IJ SOSY
40.0000 mg | PREFILLED_SYRINGE | INTRAMUSCULAR | Status: DC
  Administered 2022-09-02 – 2022-09-06 (×5): 40 mg via SUBCUTANEOUS
  Filled 2022-09-02 (×5): qty 0.4

## 2022-09-02 MED ORDER — ACETAMINOPHEN 650 MG RE SUPP: 650.0000 mg | Freq: Four times a day (QID) | RECTAL | Status: DC | PRN

## 2022-09-02 MED ORDER — POLYETHYLENE GLYCOL 3350 17 G PO PACK
17.0000 g | PACK | Freq: Two times a day (BID) | ORAL | Status: DC
  Administered 2022-09-02: 17 g via ORAL
  Filled 2022-09-02: qty 1

## 2022-09-02 MED ORDER — SENNOSIDES-DOCUSATE SODIUM 8.6-50 MG PO TABS
2.0000 | ORAL_TABLET | Freq: Two times a day (BID) | ORAL | Status: DC
  Administered 2022-09-02: 2 via ORAL
  Filled 2022-09-02: qty 2

## 2022-09-02 MED ORDER — ONDANSETRON HCL 4 MG/2ML IJ SOLN: 4.0000 mg | Freq: Four times a day (QID) | INTRAMUSCULAR | Status: DC | PRN

## 2022-09-02 MED ORDER — SODIUM CHLORIDE 0.9% FLUSH: 9.0000 mL | INTRAVENOUS | Status: DC | PRN

## 2022-09-02 MED ORDER — SODIUM CHLORIDE (PF) 0.9 % IJ SOLN
2.5000 mg | Freq: Once | INTRAMUSCULAR | Status: AC
  Administered 2022-09-02: 2.5 mg via INTRAPLEURAL
  Filled 2022-09-02: qty 2.5

## 2022-09-02 MED ORDER — DIPHENHYDRAMINE HCL 12.5 MG/5ML PO ELIX: 12.5000 mg | ORAL_SOLUTION | Freq: Four times a day (QID) | ORAL | Status: DC | PRN

## 2022-09-02 MED ORDER — SENNOSIDES-DOCUSATE SODIUM 8.6-50 MG PO TABS
1.0000 | ORAL_TABLET | Freq: Every evening | ORAL | Status: DC | PRN
  Administered 2022-09-03: 1 via ORAL
  Filled 2022-09-02: qty 1

## 2022-09-02 MED ORDER — BISACODYL 5 MG PO TBEC
5.0000 mg | DELAYED_RELEASE_TABLET | Freq: Every day | ORAL | Status: DC | PRN
  Administered 2022-09-03 – 2022-09-04 (×2): 5 mg via ORAL
  Filled 2022-09-02 (×2): qty 1

## 2022-09-02 MED ORDER — OXYCODONE HCL 5 MG PO TABS: 5.0000 mg | ORAL_TABLET | Freq: Four times a day (QID) | ORAL | Status: DC | PRN

## 2022-09-02 MED ORDER — METHOCARBAMOL 1000 MG/10ML IJ SOLN
500.0000 mg | Freq: Three times a day (TID) | INTRAVENOUS | Status: DC | PRN
  Administered 2022-09-03 – 2022-09-04 (×3): 500 mg via INTRAVENOUS
  Filled 2022-09-02 (×2): qty 500
  Filled 2022-09-02: qty 5
  Filled 2022-09-02: qty 500

## 2022-09-02 MED ORDER — NICOTINE 21 MG/24HR TD PT24
21.0000 mg | MEDICATED_PATCH | Freq: Every day | TRANSDERMAL | Status: DC
  Administered 2022-09-03 – 2022-09-07 (×5): 21 mg via TRANSDERMAL
  Filled 2022-09-02 (×5): qty 1

## 2022-09-02 MED ORDER — PIPERACILLIN-TAZOBACTAM 3.375 G IVPB
3.3750 g | Freq: Three times a day (TID) | INTRAVENOUS | Status: DC
  Administered 2022-09-02 – 2022-09-07 (×14): 3.375 g via INTRAVENOUS
  Filled 2022-09-02 (×14): qty 50

## 2022-09-02 MED ORDER — MORPHINE SULFATE 1 MG/ML IV SOLN PCA
INTRAVENOUS | Status: DC
  Administered 2022-09-02: 3 mg via INTRAVENOUS
  Filled 2022-09-02 (×2): qty 30

## 2022-09-02 MED ORDER — NALOXONE HCL 0.4 MG/ML IJ SOLN: 0.4000 mg | INTRAMUSCULAR | Status: DC | PRN

## 2022-09-02 MED ORDER — ACETAMINOPHEN 325 MG PO TABS
650.0000 mg | ORAL_TABLET | Freq: Four times a day (QID) | ORAL | Status: DC | PRN
  Administered 2022-09-04 – 2022-09-06 (×3): 650 mg via ORAL
  Filled 2022-09-02 (×3): qty 2

## 2022-09-02 MED ORDER — AZITHROMYCIN 500 MG PO TABS: 500.0000 mg | ORAL_TABLET | Freq: Every day | ORAL | Status: DC

## 2022-09-02 MED ORDER — GUAIFENESIN ER 600 MG PO TB12
1200.0000 mg | ORAL_TABLET | Freq: Two times a day (BID) | ORAL | Status: DC
  Administered 2022-09-03 – 2022-09-07 (×9): 1200 mg via ORAL
  Filled 2022-09-02 (×9): qty 2

## 2022-09-02 MED ORDER — DIPHENHYDRAMINE HCL 50 MG/ML IJ SOLN: 12.5000 mg | Freq: Four times a day (QID) | INTRAMUSCULAR | Status: DC | PRN

## 2022-09-02 MED ORDER — GUAIFENESIN ER 600 MG PO TB12
600.0000 mg | ORAL_TABLET | Freq: Two times a day (BID) | ORAL | Status: DC
  Administered 2022-09-02: 600 mg via ORAL
  Filled 2022-09-02: qty 1

## 2022-09-02 MED ORDER — HYDROXYZINE HCL 10 MG PO TABS
10.0000 mg | ORAL_TABLET | Freq: Three times a day (TID) | ORAL | Status: DC | PRN
  Administered 2022-09-02: 10 mg via ORAL
  Filled 2022-09-02 (×2): qty 1

## 2022-09-02 MED ORDER — OXYCODONE HCL 5 MG PO TABS
5.0000 mg | ORAL_TABLET | Freq: Four times a day (QID) | ORAL | Status: DC | PRN
  Administered 2022-09-02 – 2022-09-07 (×16): 10 mg via ORAL
  Filled 2022-09-02 (×16): qty 2

## 2022-09-02 NOTE — Assessment & Plan Note (Signed)
CTA chest obtained on 10/22 demonstrated small right pleural effusion.  Patient presented with worsening symptoms with chest x-ray demonstrating enlarging pleural effusion consistent with CAP complicated by either para-pneumonic effusion or empyema.  CT confirmed parapneumonic effusion  - Continue Zosyn, azithromycin.  MRSA PCR negative.  Stopped vancomycin

## 2022-09-02 NOTE — Assessment & Plan Note (Signed)
ALT and total bilirubin elevated on admission.  Recent right upper quadrant ultrasound  and CT abdomen/pelvis were within normal limits.  LFTs improving now 

## 2022-09-02 NOTE — Assessment & Plan Note (Signed)
Seen on CT scan.  On POCUS he had right-sided pleural effusion with underlying collapse/consolidation so underwent ultrasound-guided tube thoracostomy and underwent intrapleural fibrinolytic therapy with tPA/DNase twice Continue broad-spectrum antibiotics PCCM discussed the case with thoracic surgery at [facility] who recommends transfer for to [facility] now.  Still waiting for bed

## 2022-09-02 NOTE — Progress Notes (Signed)
This RN contacted Lauren with [facility] in regards to patient transfer. Informed this RN no beds available at this time. MD Manuella Ghazi updated by this RN face to face. CN Katie updated via secure chat. Patient updated at bedside by this RN.

## 2022-09-02 NOTE — Progress Notes (Signed)
  Progress Note   Patient: Carl Le QIW:[id number] DOB: 10-27-1981 DOA: 08/30/2022     3 DOS: the patient was seen and examined on 09/02/2022   Brief hospital course: 41 year old male admitted for community acquired pneumonia of right lower lobe and was found to have complicated parapneumonic effusion  10/25: Status post chest tube placement under ultrasound guidance for parapneumonic effusion 10/26: pleural fibrinolysis per PCCM team 10/27: waiting for [facility] transfer for CT surgery eval 10/28: Pleural fibrinolysis done again while waiting for bed at [facility] and Plan: * CAP (community acquired pneumonia) CTA chest obtained on 10/22 demonstrated small right pleural effusion.  Patient presented with worsening symptoms with chest x-ray demonstrating enlarging pleural effusion consistent with CAP complicated by either para-pneumonic effusion or empyema.  CT confirmed parapneumonic effusion  - Continue Zosyn, azithromycin.  MRSA PCR negative.  Stopped vancomycin   Sinus tachycardia Likely in the setting of uncontrolled pain.  - Telemetry monitoring - On morphine PCA and fentanyl, oral oxycodone as needed for pain control.  Seem to help  LFT elevation ALT and total bilirubin elevated on admission.  Recent right upper quadrant ultrasound  and CT abdomen/pelvis were within normal limits.  LFTs improving now.  Hyperglycemia In the setting of acute illness  - A1c 4.7.  This is not diabetes - No indication for SSI at this time  Pneumonia of right lung due to infectious organism Seen on CT scan.  On POCUS he had right-sided pleural effusion with underlying collapse/consolidation so underwent ultrasound-guided tube thoracostomy and underwent intrapleural fibrinolytic therapy with tPA/DNase twice Continue broad-spectrum antibiotics PCCM discussed the case with thoracic surgery at [facility] who recommends transfer for to [facility] now.  Still waiting for bed         Subjective: No new issues.  Pain reasonably well controlled  Physical Exam: Vitals:   09/02/22 1100 09/02/22 1131 09/02/22 1200 09/02/22 1400  BP: 131/74  118/84 125/81  Pulse: (!) 104  (!) 102 (!) 108  Resp: 16 (!) 21 17 18   Temp:   98.4 F (36.9 C)   TempSrc:   Oral   SpO2: 95% 95% 96% 95%  Weight:      Height:       41 year old male lying in the bed in no acute pain/distress Lungs Rales at the bases right more than the left, chest tube on the right Cardiovascular regular rhythm, tachycardic Abdomen soft, benign Neuro alert and awake, nonfocal Data Reviewed:  There are no new results to review at this time.  Family Communication: None  Disposition: Status is: Inpatient Remains inpatient appropriate because: Waiting for [facility] bed availability for transfer for CT surgery evaluation.  Management of complicated parapneumonic effusion   Planned Discharge Destination: Waiting for [place] bed availability for transfer    DVT prophylaxis-Lovenox Time spent: 35 minutes  Author: Max Sane, MD 09/02/2022 2:58 PM  For on call review [email or web address].

## 2022-09-02 NOTE — Assessment & Plan Note (Signed)
In the setting of acute illness  - A1c 4.7.  This is not diabetes - No indication for SSI at this time

## 2022-09-02 NOTE — Assessment & Plan Note (Addendum)
Complicated right-sided parapneumonic effusion Transferred from [facility] for cardiothoracic surgery evaluation and assessment for potential VATS washout. -10/25 s/p chest tube placement at [facility] by PCCM -10/26>>10/28 daily pleural fibrinolysis performed per [facility] PCCM -Blood cultures and pleural fluid cultures NGTD -Continue Zosyn, now off vancomycin and azithromycin -CT surgery consult in a.m.

## 2022-09-02 NOTE — Assessment & Plan Note (Signed)
Secondary to parapneumonic effusion.  Stable on 3-4 L O2 via [place].

## 2022-09-02 NOTE — Assessment & Plan Note (Signed)
Reports smoking 1 pack/day for many years.  Continue nicotine patch.

## 2022-09-02 NOTE — Hospital Course (Signed)
Carl Le is a 41 y.o. male with medical history significant for tobacco use who is directly admitted from [facility] with right lower lobe pneumonia complicated by parapneumonic effusion s/p chest tube placement on 10/25 and daily fibrinolysis 10/26 >> 10/28 and transferred for cardiothoracic surgery evaluation.

## 2022-09-02 NOTE — Progress Notes (Signed)
Chest tube unclamped at 1210 per MD Dgayli verbal order at bedside post one hour administration of alteplase. OP of 90 marked prior to unclamp. Patient continues to request PRN medications more frequently than ordered. PRN medications administered per order as documented on [facility] by this RN. PCA pump remains at bedside and patient demand activated.

## 2022-09-02 NOTE — Progress Notes (Signed)
Pt enroute to [place]

## 2022-09-02 NOTE — Procedures (Signed)
Pleural Fibrinolytic Administration Procedure Note  Carl Le  [id number]  01-10-1981  Date:09/02/22  Time:11:12 AM   Provider Performing:Tanya Crothers   Procedure: Pleural Fibrinolysis Subsequent day [phone])  Indication(s) Fibrinolysis of complicated pleural effusion  Consent Risks of the procedure as well as the alternatives and risks of each were explained to the patient and/or caregiver.  Consent for the procedure was obtained.   Anesthesia None   Time Out Verified patient identification, verified procedure, site/side was marked, verified correct patient position, special equipment/implants available, medications/allergies/relevant history reviewed, required imaging and test results available.   Sterile Technique Hand hygiene, gloves   Procedure Description Existing pleural catheter was cleaned and accessed in sterile manner. 2.5 mg of tPA in 30cc of saline and 5mg  of dornase in 30cc of sterile water were injected into pleural space using existing pleural catheter.  Catheter will be clamped for 1 hour and then placed back to suction.   Complications/Tolerance None; patient tolerated the procedure well.  EBL None   Specimen(s) None  Armando Reichert, MD [facility]

## 2022-09-02 NOTE — Progress Notes (Signed)
This nurse Wasted 21 mg of morphine PCA in stericycle with CN Erika.

## 2022-09-02 NOTE — H&P (Addendum)
History and Physical    Carl Le IOX:[id number] DOB: [birth year] DOA: 09/02/2022  PCP: Pcp, No  Patient coming from: Home  I have personally briefly reviewed patient's old medical records in [place]  Chief Complaint: Pneumonia  HPI: Carl Le is a 41 y.o. male with medical history significant for tobacco use who is directly admitted from [facility] for management of community-acquired right lower lobe pneumonia complicated by parapneumonic effusion.  Initially presented to [facility] on 10/25 with cough, leukocytosis.  CT chest showed a loculated right pleural effusion with associated passive atelectasis of the right lower and middle lobe.  PCCM was consulted and chest tube was placed on 10/25.  He was started on daily pleural fibrinolysis per PCCM on 10/26.  Initial antibiotics were vancomycin, Zosyn, and azithromycin and he has since been narrowed down to Zosyn alone.  Blood cultures collected 10/25 NGTD.  Pleural fluid culture collected 10/26 NGTD.  Strep pneumonia urinary antigen negative.  Legionella in process.  COVID PCR negative.  HIV negative.  He was placed on morphine PCA with fentanyl and oxycodone as needed for uncontrolled pain.  Repeat imaging with CTA chest 10/27 was negative for evidence of PE.  Pigtail chest tube was seen at the right lung base with new small hydropneumothorax with overall amount of loculated pleural fluid decreased compared to previous study.  New complete collapse of the right middle and lower lobes with debris and obstruction of the central bronchi new areas of hypoenhancement centrally in both lobes seen.  Per documentation, PCCM at [facility] discussed with [facility] who accepted to see in consultation upon transfer to assess candidacy for VATS washout.  Patient seen on the floor after transfer.  He is sitting up in bed.  Currently saturating well on 3-4 L O2 via [place].  Still having significant right lower chest pain as  well as right lower back near site of pigtail chest tube.  Pain is worse with deep inspiratiion, cough, hiccups.  Cough is dry with chest congestion.  He denies any subjective fevers.  He is a chronic smoker of a pack per day for many years.  Review of Systems: All systems reviewed and are negative except as documented in history of present illness above.   Past Medical History:  Diagnosis Date   Current smoker    Herpes zoster    Kidney calculus    Mild emphysema ([place]) 08/30/2022   noted on ct scan    No past surgical history on file.  Social History:  reports that he has been smoking cigarettes. He has been smoking an average of 1 pack per day. He does not have any smokeless tobacco history on file. He reports current alcohol use. 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  No Known Allergies  Family History  Problem Relation Age of Onset   Heart attack Father    Heart attack Brother      Prior to Admission medications   Medication Sig Start Date End Date Taking? Authorizing Provider  piperacillin-tazobactam (ZOSYN) 3.375 GM/50ML IVPB Inject 50 mLs (3.375 g total) into the vein every 8 (eight) hours. 09/01/22   Max Sane, MD    Physical Exam: Vitals:   09/02/22 2102 09/02/22 2338  BP: 119/76 128/83  Pulse: (!) 115 (!) 107  Resp: 19 18  Temp: 98.7 F (37.1 C) 98.9 F (37.2 C)  TempSrc: Oral Oral  SpO2: 97% 98%  Constitutional: Thin man sitting up in bed, calm, answering questions appropriately, somewhat uncomfortable Eyes: EOMI, lids and conjunctivae normal ENMT: Mucous membranes are moist. Posterior pharynx clear of any exudate or lesions.Normal dentition.  Neck: normal, supple, no masses. Respiratory: Pigtail chest tube in place posterior right chest.  Diminished breath sounds right lower lung field.  Normal respiratory effort while on 3 L O2 via [place]. No accessory muscle use.  Cardiovascular: Tachycardia, no  murmurs / rubs / gallops. No extremity edema. 2+ pedal pulses. Abdomen: no tenderness, no masses palpated. Musculoskeletal: no clubbing / cyanosis. No joint deformity upper and lower extremities. Good ROM, no contractures. Normal muscle tone.  Skin: no rashes, lesions, ulcers. No induration Neurologic: Sensation intact. Strength 5/5 in all 4.  Psychiatric:  Alert and oriented x 3. Normal mood.   EKG: Personally reviewed. 10/25 EKG showed sinus tachycardia, rate 118, motion artifact limiting interpretation.  Rate is faster when compared to previous from 2015.  Assessment/Plan Principal Problem:   Community acquired pneumonia of right lower lobe of lung Active Problems:   Parapneumonic effusion   Acute respiratory failure with hypoxia (HCC)   Sinus tachycardia   Hyponatremia   Tobacco use   Carl Le is a 41 y.o. male with medical history significant for tobacco use who is directly admitted from [facility] with right lower lobe pneumonia complicated by parapneumonic effusion s/p chest tube placement on 10/25 and daily fibrinolysis 10/26 >> 10/28 and transferred for cardiothoracic surgery evaluation.  Assessment and Plan: * Community acquired pneumonia of right lower lobe of lung Complicated right-sided parapneumonic effusion Transferred from [facility] for cardiothoracic surgery evaluation and assessment for potential VATS washout. -10/25 s/p chest tube placement at [facility] by PCCM -10/26>>10/28 daily pleural fibrinolysis performed per [facility] PCCM -Blood cultures and pleural fluid cultures NGTD -Continue Zosyn, now off vancomycin and azithromycin -CT surgery consult in a.m.  Acute respiratory failure with hypoxia (HCC) Secondary to parapneumonic effusion.  Stable on 3-4 L O2 via [place].  Sinus tachycardia Secondary to uncontrolled pain and anxiety.  He was on morphine PCA, fentanyl, Dilaudid, oxycodone prn, Valium at previous hospital.  He is asking for all of these on arrival. -Continue  morphine PCA -Tylenol and oxycodone as needed -Atarax as needed for anxiety  Tobacco use Reports smoking 1 pack/day for many years.  Continue nicotine patch.  Hyponatremia Last sodium 130.  Will recheck in AM.  DVT prophylaxis: enoxaparin (LOVENOX) injection 40 mg Start: 09/02/22 2245 Code Status: Full code, confirmed with patient on admission Family Communication: Discussed with patient, he has discussed with family Disposition Plan: From home, dispo pending clinical progress Consults called: CT surgery consult needed in a.m. Severity of Illness: The appropriate patient status for this patient is INPATIENT. Inpatient status is judged to be reasonable and necessary in order to provide the required intensity of service to ensure the patient's safety. The patient's presenting symptoms, physical exam findings, and initial radiographic and laboratory data in the context of their chronic comorbidities is felt to place them at high risk for further clinical deterioration. Furthermore, it is not anticipated that the patient will be medically stable for discharge from the hospital within 2 midnights of admission.   * I certify that at the point of admission it is my clinical judgment that the patient will require inpatient hospital care spanning beyond 2 midnights from the point of admission due to high intensity of service, high risk for further deterioration and high frequency of surveillance required.Darreld Mclean MD  [facility]  If 7PM-7AM, please contact night-coverage [email or web address]  09/02/2022, 11:50 PM

## 2022-09-02 NOTE — Progress Notes (Signed)
PHARMACIST - PHYSICIAN COMMUNICATION  CONCERNING: Antibiotic IV to Oral Route Change Policy  RECOMMENDATION: This patient is receiving azithromycin by the intravenous route.  Based on criteria approved by the Pharmacy and Therapeutics Committee, the antibiotic(s) is/are being converted to the equivalent oral dose form(s).   DESCRIPTION: These criteria include: Patient being treated for a respiratory tract infection, urinary tract infection, cellulitis or clostridium difficile associated diarrhea if on metronidazole The patient is not neutropenic and does not exhibit a GI malabsorption state The patient is eating (either orally or via tube) and/or has been taking other orally administered medications for a least 24 hours The patient is improving clinically and has a Tmax < 100.5  If you have questions about this conversion, please contact the Pharmacy Department   Janin Kozlowski B Brandyn Lowrey  09/02/22    

## 2022-09-02 NOTE — Assessment & Plan Note (Signed)
Likely in the setting of uncontrolled pain.  - Telemetry monitoring - On morphine PCA and fentanyl, oral oxycodone as needed for pain control.  Seem to help

## 2022-09-02 NOTE — Assessment & Plan Note (Addendum)
Secondary to uncontrolled pain and anxiety.  He was on morphine PCA, fentanyl, Dilaudid, oxycodone prn, Valium at previous hospital.  He is asking for all of these on arrival. -Continue morphine PCA -Tylenol and oxycodone as needed -Atarax as needed for anxiety

## 2022-09-02 NOTE — Progress Notes (Addendum)
NAME:  Carl RIESGO, MRN:  [id number], DOB:  1981/08/09, LOS: 3 ADMISSION DATE:  08/30/2022, CONSULTATION DATE:  08/30/2022 REFERRING MD:  Max Sane, MD , CHIEF COMPLAINT:  Right sided parapneumonic effusion   History of Present Illness:   41 yo M presenting to [facility] ED on 08/30/22 with complaints of 3-4 days of sharp pleuritic right sided chest pain. He admits to an associated cough with some shortness of breath. He denies fever/chills, vomiting or diarrhea. Taking deep breaths exacerbates his pain and nothing has improved his symptoms.  Of note he was seen 3 days prior to presentation at the [facility] ED and was diagnosed with a pneumonia after CTa imaging. He was sent home with Augmentin and doxycycline with which he reports being compliant. He did not notice any improvement in his symptoms. He denies pain or swelling in his legs. He does report smoking a pack a day.   In the ED, he was noted to have a right sided infiltrate on CXR with an elevated white count of 25.5K. Antibiotics were administered and patient was admitted to medicine. TR[facility] contacted pulmonary overnight for the evaluation of a possible pleural effusion. On [place], there appeared to be an effusion in place, and this was confirmed with a contrasted CT scan of the chest. An ultrasound guided pig-tail catheter was placed in the right pleural space with pleural fluid consistent with a para-pneumonic effusion.  Pertinent  Medical History  Kidney stone Herpes Zoster Current Everyday smoker  Significant Hospital Events: Including procedures, antibiotic start and stop dates in addition to other pertinent events   08/30/2022: chest tube placed under ultrasound guidance 08/31/2022: started tpa/DNAse, discussed case with thoracic surgery at [facility]. Recommended tpa/dnase and reimage prior to consideration of a VATS washout. 10 mg tPA/5 mg Dnase instilled in pleural space. 09/01/2022: 2.5 mtPA/5mg  DNAse instilled in the pleural space.  Repeat CT obtained  Interim History / Subjective:  Right sided chest wall pain and muscle spasms improved. Patient is awake, alert and interactive.  Objective   Blood pressure 115/87, pulse (!) 108, temperature 98.6 F (37 C), temperature source Oral, resp. rate (!) 23, height 6' (1.829 m), weight 77.1 kg, SpO2 97 %.    FiO2 (%):  [30 %-36 %] 36 %   Intake/Output Summary (Last 24 hours) at 09/02/2022 0921 Last data filed at 09/02/2022 4854 Gross per 24 hour  Intake 1009.81 ml  Output 1970 ml  Net -960.19 ml    Filed Weights   08/30/22 1550  Weight: 77.1 kg    Examination: Physical Exam Vitals reviewed.  Constitutional:      General: He is not in acute distress.    Appearance: He is well-developed. He is not ill-appearing.  HENT:     Head: Normocephalic.  Eyes:     Extraocular Movements: Extraocular movements intact.     Pupils: Pupils are equal, round, and reactive to light.  Cardiovascular:     Rate and Rhythm: Regular rhythm. Tachycardia present.     Pulses: Normal pulses.     Heart sounds: Normal heart sounds.  Pulmonary:     Effort: No respiratory distress.     Breath sounds: Rales (over the right base) present.  Abdominal:     Palpations: Abdomen is soft.  Musculoskeletal:     Right lower leg: No edema.     Left lower leg: No edema.  Neurological:     General: No focal deficit present.     Mental Status: He is  alert and oriented to person, place, and time.      Assessment & Plan:   #Community Acquired Pneumonia of the RLL #Complicated Para-pneumonic effusion  41 year old male presenting with cough, leukocytosis, and a right sided infiltrate on CXR. Confirmed to be a lobar pneumonia on CT with a surrounding parapneumonic effusion. On POCUS there was a right sided pleural effusion with underlying collapse/consolidation. Overall picture highly concerning for community acquired pneumonia with parapneumonic effusion for which tube thoracostomy was performed  under ultrasound guidance. Pleural fluid was cloudy and thick (LDH 938, 37K white cells with 96% neutrophils, gram stain negative, culture pending). Appearance and LDH suggest a complicated para-pneumonic effusion (2 points on the RAPID score suggesting low risk of mortality).  Repeat CT chest yesterday showed an anterior loculation with a posterior hydropneumothorax.  Plan is to instill intra-pleural lytics to help break down and clear his effusion. He received a full dose of lytics two days ago and reduced dose yesterday. Will continue with reduced dose lytics today given serosanguinous appearance of the fluid, improvement in the posterior loculation, and presence of a non-communicating anterior loculation. My concern is that the anterior effusion (noted today on ultrasound) is non-communicating and will not clear with further lytics. He will likely require a second chest tube as well as thoracic surgery evaluation for VATS.  I have reached out to thoracic surgery at [facility] thoracic surgery and asked they review imaging and assess candidacy for VATS washout; they have accepted him for transfer for an evaluation for washout. He is currently pending a bed [facility]. In the meantime, I will proceed with intra-pleural lytics, albeit with dose adjustment given serosanguinous nature of the fluid. Will administer 2.5 mg of tPA and 5 mg DNAse per the ADAPT-2 trial (Respirology. 2022 Jul;27(7):510-516). Will continue q6hour intra-pleural saline flushes and broad spectrum antibiotics pending cultures (including legionella coverage given large lobar pneumonia). Our team will continue to follow along.  Armando Reichert, MD [facility]  Labs   CBC: Recent Labs  Lab 08/30/22 1616 08/31/22 0255 09/01/22 0510  WBC 25.5* 22.1* 24.3*  NEUTROABS 23.0* 19.1*  --   HGB 16.2 14.0 13.9  HCT 47.7 41.9 41.1  MCV 87.4 87.7 86.5  PLT 362 288 277     Basic Metabolic Panel: Recent Labs  Lab  08/30/22 1616 08/31/22 0255 09/01/22 0510  NA 135 135 130*  K 4.7 3.7 4.1  CL 103 106 98  CO2 20* 21* 23  GLUCOSE 150* 116* 159*  BUN 13 15 15   CREATININE 0.82 0.78 0.79  CALCIUM 9.5 7.8* 8.4*  MG  --  1.4* 2.0  PHOS  --  2.4* 2.4*    GFR: Estimated Creatinine Clearance: 135.2 mL/min (by C-G formula based on SCr of 0.79 mg/dL). Recent Labs  Lab 08/30/22 1616 08/30/22 1829 08/31/22 0015 08/31/22 0255 09/01/22 0510  PROCALCITON  --   --   --  2.15  --   WBC 25.5*  --   --  22.1* 24.3*  LATICACIDVEN 2.0* 2.2* 1.5 1.1  --      Liver Function Tests: Recent Labs  Lab 08/30/22 1616 08/31/22 0015 08/31/22 0255  AST 38  --  21  ALT 82*  --  46*  ALKPHOS 104  --  70  BILITOT 1.9*  --  2.0*  PROT 8.6*  --  6.1*  ALBUMIN 4.3 3.5 3.1*    No results for input(s): "LIPASE", "AMYLASE" in the last 168 hours.  No results for input(s): "AMMONIA" in the last 168 hours.  ABG    Component Value Date/Time   PHART 7.43 08/30/2022 2131   PCO2ART 36 08/30/2022 2131   PO2ART 69 (L) 08/30/2022 2131   HCO3 23.9 08/30/2022 2131   ACIDBASEDEF 0.1 08/30/2022 2131   O2SAT 97.3 08/30/2022 2131     Coagulation Profile: No results for input(s): "INR", "PROTIME" in the last 168 hours.  Cardiac Enzymes: No results for input(s): "CKTOTAL", "CKMB", "CKMBINDEX", "TROPONINI" in the last 168 hours.  HbA1C: Hgb A1c MFr Bld  Date/Time Value Ref Range Status  08/31/2022 02:55 AM 4.7 (L) 4.8 - 5.6 % Final    Comment:    (NOTE) Pre diabetes:          5.7%-6.4%  Diabetes:              >6.4%  Glycemic control for   <7.0% adults with diabetes     CBG: Recent Labs  Lab 09/01/22 1112 09/01/22 1927 09/02/22 0007 09/02/22 0346 09/02/22 0731  GLUCAP 111* 115* 145* 150* 97     Past Medical History:  He,  has a past medical history of Current smoker, Herpes zoster, Kidney calculus, and Mild emphysema ([place]) (08/30/2022).   Surgical History:  History reviewed. No pertinent surgical  history.   Social History:   reports that he has been smoking cigarettes. He has been smoking an average of 1 pack per day. He does not have any smokeless tobacco history on file. He reports current alcohol use. 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   Family History:  His family history includes Heart attack in his brother and father.   Allergies Not on File   Home Medications  Prior to Admission medications   Medication Sig Start Date End Date Taking? Authorizing Provider  amoxicillin-clavulanate (AUGMENTIN) 875-125 MG tablet Take 1 tablet by mouth 2 (two) times daily. 08/27/22  Yes [provider]  doxycycline (VIBRAMYCIN) 100 MG capsule Take 100 mg by mouth 2 (two) times daily. 08/27/22  Yes [provider]  HYDROcodone bit-homatropine (HYCODAN) 5-1.5 MG/5ML syrup Take 5 mLs by mouth every 6 (six) hours as needed. 08/27/22  Yes [provider]     Total care time: 41 minutes

## 2022-09-02 NOTE — Assessment & Plan Note (Signed)
Last sodium 130.  Will recheck in AM.

## 2022-09-02 NOTE — Progress Notes (Signed)
Left forearm where PIV was removed yesterday non painful per patient report and remains slightly red. Not warm to the touch.

## 2022-09-03 ENCOUNTER — Inpatient Hospital Stay (HOSPITAL_COMMUNITY): Payer: BLUE CROSS/BLUE SHIELD

## 2022-09-03 DIAGNOSIS — J189 Pneumonia, unspecified organism: Secondary | ICD-10-CM | POA: Diagnosis not present

## 2022-09-03 DIAGNOSIS — J90 Pleural effusion, not elsewhere classified: Secondary | ICD-10-CM

## 2022-09-03 DIAGNOSIS — J869 Pyothorax without fistula: Secondary | ICD-10-CM

## 2022-09-03 LAB — CBC
HCT: 35.3 % — ABNORMAL LOW (ref 39.0–52.0)
Hemoglobin: 12.7 g/dL — ABNORMAL LOW (ref 13.0–17.0)
MCH: 31.1 pg (ref 26.0–34.0)
MCHC: 36 g/dL (ref 30.0–36.0)
MCV: 86.5 fL (ref 80.0–100.0)
Platelets: 273 10*3/uL (ref 150–400)
RBC: 4.08 MIL/uL — ABNORMAL LOW (ref 4.22–5.81)
RDW: 12.8 % (ref 11.5–15.5)
WBC: 14.6 10*3/uL — ABNORMAL HIGH (ref 4.0–10.5)
nRBC: 0 % (ref 0.0–0.2)

## 2022-09-03 LAB — BODY FLUID CULTURE W GRAM STAIN: Culture: NO GROWTH

## 2022-09-03 LAB — COMPREHENSIVE METABOLIC PANEL
ALT: 49 U/L — ABNORMAL HIGH (ref 0–44)
AST: 38 U/L (ref 15–41)
Albumin: 2.1 g/dL — ABNORMAL LOW (ref 3.5–5.0)
Alkaline Phosphatase: 80 U/L (ref 38–126)
Anion gap: 8 (ref 5–15)
BUN: 10 mg/dL (ref 6–20)
CO2: 24 mmol/L (ref 22–32)
Calcium: 8 mg/dL — ABNORMAL LOW (ref 8.9–10.3)
Chloride: 101 mmol/L (ref 98–111)
Creatinine, Ser: 0.72 mg/dL (ref 0.61–1.24)
GFR, Estimated: >60 mL/min (ref >60)
Glucose, Bld: 131 mg/dL — ABNORMAL HIGH (ref 70–99)
Potassium: 4.1 mmol/L (ref 3.5–5.1)
Sodium: 133 mmol/L — ABNORMAL LOW (ref 135–145)
Total Bilirubin: 0.4 mg/dL (ref 0.3–1.2)
Total Protein: 5.4 g/dL — ABNORMAL LOW (ref 6.5–8.1)

## 2022-09-03 MED ORDER — KETOROLAC TROMETHAMINE 30 MG/ML IJ SOLN
30.0000 mg | Freq: Four times a day (QID) | INTRAMUSCULAR | Status: AC
  Administered 2022-09-03 – 2022-09-06 (×11): 30 mg via INTRAVENOUS
  Filled 2022-09-03 (×11): qty 1

## 2022-09-03 MED ORDER — DIAZEPAM 5 MG PO TABS
5.0000 mg | ORAL_TABLET | Freq: Four times a day (QID) | ORAL | Status: DC | PRN
  Administered 2022-09-03 – 2022-09-07 (×11): 5 mg via ORAL
  Filled 2022-09-03 (×11): qty 1

## 2022-09-03 MED ORDER — NALOXONE HCL 0.4 MG/ML IJ SOLN: 0.4000 mg | INTRAMUSCULAR | Status: DC | PRN

## 2022-09-03 MED ORDER — FENTANYL 50 MCG/ML IV PCA SOLN
INTRAVENOUS | Status: DC
  Administered 2022-09-05: 150 ug via INTRAVENOUS
  Administered 2022-09-05: 120 ug via INTRAVENOUS
  Administered 2022-09-06: 30 ug/h via INTRAVENOUS
  Administered 2022-09-06: 105 ug via INTRAVENOUS
  Administered 2022-09-07: 1 ug via INTRAVENOUS
  Filled 2022-09-03: qty 25

## 2022-09-03 MED ORDER — SODIUM CHLORIDE 0.9% FLUSH: 9.0000 mL | INTRAVENOUS | Status: DC | PRN

## 2022-09-03 MED ORDER — ONDANSETRON HCL 4 MG/2ML IJ SOLN: 4.0000 mg | Freq: Four times a day (QID) | INTRAMUSCULAR | Status: DC | PRN

## 2022-09-03 MED ORDER — PNEUMOCOCCAL 20-VAL CONJ VACC 0.5 ML IM SUSY
0.5000 mL | PREFILLED_SYRINGE | INTRAMUSCULAR | Status: DC
  Filled 2022-09-03 (×2): qty 0.5

## 2022-09-03 NOTE — Consult Note (Addendum)
[address]       [place] [address]             [phone]        JOHNNATHAN HAGEMEISTER [facility] Medical Record #[id number] Date of Birth: 01-24-1981  Referring: Delfino Lovett, MD  Primary Care: Pcp, No   Chief Complaint:   Right sided chest pain/spasms, cough, shortness of breath Reason for consultation: Right para pneumonic effusion/empyema  History of Present Illness:     This is a 41 year old male with a past medical history of tobacco abuse and nephrolithiasis who presented to [facility] ED on 08/27/2022 where he was found to have a right pleural effusion and surrounding opacity. He was apparently discharged home with cough medicine. He had shortness of breath and cough so he presented to the [facility] ED again. He was discharged with Augmentin and Doxycycline.  Because of worsening right sided chest pain, he presented to [facility] on 08/30/2022. He denied fever or chills but did have nausea. Initial CXR showed right basilar infiltrated consistent with pneumonia and right para pneumonic pleural effusion. CT chest done 10/25 showed Loculated trace to small volume right pleural effusion no pleural thickening or enhancement to suggest empyema. WBC was 25,500. He was given Vancomycin and Cefepime. Pulmonary/CCM consulted. 14 French right pigtail catheter was placed. Antibiotics were then changed to Azithromycin, Zosyn, and Vancomycin was continued. CT Angio was done 09/01/2022 and showed no PE, loculated right pleural effusion decreased from previously, collapse of RML and RLL with debris in obstruction of the central bronchi, concerning for pneumonia, and emphysema. Pulmonary then did pleural fibrinolysis (tPA and dornase ) 10/27 and 10/28. Thoracic surgery was asked to evaluate the patient so patient was transferred to [facility] and admitted by the Hospitalitist.  At the time of my exam, patient was initially sleeping. He was awakened and was tearful as he is in  pain.   Current Activity/ Functional Status: Patient is independent with mobility/ambulation, transfers, ADL's, IADL's.   Zubrod Score: At the time of surgery this patient's most appropriate activity status/level should be described as: []     0    Normal activity, no symptoms [x]     1    Restricted in physical strenuous activity but ambulatory, able to do out light work []     2    Ambulatory and capable of self care, unable to do work activities, up and about more than 50%  Of the time                            []     3    Only limited self care, in bed greater than 50% of waking hours []     4    Completely disabled, no self care, confined to bed or chair []     5    Moribund  Past Medical History:  Diagnosis Date   Current smoker    Herpes zoster    Kidney calculus    Mild emphysema (HCC) 08/30/2022   noted on ct scan   Past Surgical History: No past surgical history on file.  Social History   Tobacco Use  Smoking Status Every Day   Packs/day: 1.00   Types: Cigarettes  Smokeless Tobacco Not on file    Social History   Substance and Sexual Activity  Alcohol Use Yes   Comment: Socially    Allergies: No  Known Allergies  Current Facility-Administered Medications  Medication Dose Route Frequency Provider Last Rate Last Admin   acetaminophen (TYLENOL) tablet 650 mg  650 mg Oral Q6H PRN Charlsie Quest, MD       Or   acetaminophen (TYLENOL) suppository 650 mg  650 mg Rectal Q6H PRN Charlsie Quest, MD       bisacodyl (DULCOLAX) EC tablet 5 mg  5 mg Oral Daily PRN Charlsie Quest, MD       diphenhydrAMINE (BENADRYL) injection 12.5 mg  12.5 mg Intravenous Q6H PRN Charlsie Quest, MD       Or   diphenhydrAMINE (BENADRYL) 12.5 MG/5ML elixir 12.5 mg  12.5 mg Oral Q6H PRN Charlsie Quest, MD       enoxaparin (LOVENOX) injection 40 mg  40 mg Subcutaneous Q24H Darreld Mclean R, MD   40 mg at 09/02/22 2238   guaiFENesin (MUCINEX) 12 hr tablet 1,200 mg  1,200 mg Oral BID Darreld Mclean R, MD   1,200 mg at 09/03/22 1110   hydrOXYzine (ATARAX) tablet 10 mg  10 mg Oral TID PRN Charlsie Quest, MD   10 mg at 09/02/22 2339   methocarbamol (ROBAXIN) 500 mg in dextrose 5 % 50 mL IVPB  500 mg Intravenous Q8H PRN Charlsie Quest, MD   Stopped at 09/03/22 0051   morphine 1 mg/mL PCA injection   Intravenous Q4H Charlsie Quest, MD   Received at 09/03/22 0439   naloxone (NARCAN) injection 0.4 mg  0.4 mg Intravenous PRN Charlsie Quest, MD       And   sodium chloride flush (NS) 0.9 % injection 9 mL  9 mL Intravenous PRN Charlsie Quest, MD       nicotine (NICODERM CQ - dosed in mg/24 hours) patch 21 mg  21 mg Transdermal Daily Darreld Mclean R, MD   21 mg at 09/03/22 1111   ondansetron (ZOFRAN) injection 4 mg  4 mg Intravenous Q6H PRN Charlsie Quest, MD       oxyCODONE (Oxy IR/ROXICODONE) immediate release tablet 5-10 mg  5-10 mg Oral Q6H PRN Charlsie Quest, MD   10 mg at 09/03/22 1110   piperacillin-tazobactam (ZOSYN) IVPB 3.375 g  3.375 g Intravenous Q8H Patel, Eston Esters R, MD 12.5 mL/hr at 09/03/22 0601 3.375 g at 09/03/22 0601   [START ON 09/04/2022] pneumococcal 20-valent conjugate vaccine (PREVNAR 20) injection 0.5 mL  0.5 mL Intramuscular Tomorrow-1000 Charlsie Quest, MD       senna-docusate (Senokot-S) tablet 1 tablet  1 tablet Oral QHS PRN Charlsie Quest, MD        Medications Prior to Admission  Medication Sig Dispense Refill Last Dose   acetaminophen (TYLENOL) 500 MG tablet Take 1,000 mg by mouth 2 (two) times daily as needed (pain).   Past Week   amoxicillin-clavulanate (AUGMENTIN) 875-125 MG tablet Take 1 tablet by mouth 2 (two) times daily.   08/30/2022   doxycycline (VIBRAMYCIN) 100 MG capsule Take 100 mg by mouth 2 (two) times daily.   08/30/2022   HYDROcodone bit-homatropine (HYCODAN) 5-1.5 MG/5ML syrup Take 5 mLs by mouth every 6 (six) hours as needed for cough.   08/30/2022   ibuprofen (ADVIL) 200 MG tablet Take 600-800 mg by mouth 2 (two) times daily as needed  (pain).   Past Week   oxymetazoline (AFRIN) 0.05 % nasal spray Place 1 spray into both nostrils daily as needed for congestion.   08/30/2022   piperacillin-tazobactam (ZOSYN)  3.375 GM/50ML IVPB Inject 50 mLs (3.375 g total) into the vein every 8 (eight) hours. (Patient not taking: Reported on 09/03/2022) 50 mL 0 Not Taking    Family History  Problem Relation Age of Onset   Heart attack Father    Heart attack Brother    Review of Systems:     General Review of Systems: [Y] = yes [N  ]=no Constitional:  nausea [ previously ]; night sweats [ N ]; fever [  N]; or chills [ N ]                                                                Eye : Amaurosis fugax[ N ]; Resp: cough [  Y];  hemoptysisN[  ]; shortness of breath[   GI:   vomiting[N  ]; melena[ N ];  hematochezia [  N]; GU: hematuria[N  ];          Heme/Lymph: anemia[ N ];  Neuro: TIA[ N ];  stroke[ N ];    Endocrine: diabetes[N  ];                  Physical Exam: BP 129/84 (BP Location: Left Arm)   Pulse (!) 103   Temp 97.7 F (36.5 C) (Oral)   Resp 16   Ht 6' (1.829 m)   Wt 80.1 kg   SpO2 97%   BMI 23.95 kg/m    General appearance: cooperative and no distress Head: Normocephalic, without obvious abnormality, atraumatic Neck: supple, symmetrical, trachea midline Resp: clear to auscultation on left and diminished right base. Right chest tube to suction, no air leak Cardio: Slightly tachycardic Extremities: No LE edema Neurologic: Grossly normal  Diagnostic Studies & Laboratory data:     Recent Radiology Findings:   DG CHEST PORT 1 VIEW  Result Date: 09/03/2022 CLINICAL DATA:  Parapneumonic effusion EXAM: PORTABLE CHEST 1 VIEW COMPARISON:  Prior chest x-ray yesterday FINDINGS: Right-sided chest tube remains in place. Slightly decreased right basilar airspace opacity. Persistent streaky opacities in the left lung base. Stable cardiac and mediastinal contours. No pneumothorax. No acute osseous abnormality.  IMPRESSION: 1. Stable position of right-sided chest tube. 2. Improving right basilar airspace opacities. 3. Persistent left basilar atelectasis. Electronically Signed   By: Malachy Moan M.D.   On: 09/03/2022 07:58   DG Chest Port 1 View  Result Date: 09/02/2022 CLINICAL DATA:  Pleural effusion. EXAM: PORTABLE CHEST 1 VIEW COMPARISON:  09/01/2022 FINDINGS: There is a right pigtail thoracostomy tube overlying the right mid lung. This is unchanged in position from previous exam. No pneumothorax identified. Unchanged appearance of moderate to large right pleural effusion. Similar appearance of small left pleural effusion with overlying atelectasis. IMPRESSION: 1. Stable position of right pigtail thoracostomy tube. No pneumothorax. 2. Unchanged moderate to large right pleural effusion. Electronically Signed   By: Signa Kell M.D.   On: 09/02/2022 08:15     I have independently reviewed the above radiologic studies and discussed with the patient   Recent Lab Findings: Lab Results  Component Value Date   WBC 14.6 (H) 09/03/2022   HGB 12.7 (L) 09/03/2022   HCT 35.3 (L) 09/03/2022   PLT 273 09/03/2022   GLUCOSE 131 (H) 09/03/2022   ALT 49 (H) 09/03/2022  AST 38 09/03/2022   NA 133 (L) 09/03/2022   K 4.1 09/03/2022   CL 101 09/03/2022   CREATININE 0.72 09/03/2022   BUN 10 09/03/2022   CO2 24 09/03/2022   HGBA1C 4.7 (L) 08/31/2022    Assessment / Plan:   Right para pneumonic effusion/empyema-14 French right chest tube placed by CC 10/26.  CC has done pleural fibrinolysis 10/27 and 10/28. As discussed with Dr. Lavonna Monarch, will repeat CT chest. May need right VATS, surgeon to evaluate in am 2. ID-on Zosyn for CAP. WBC decreased from 24,300 to 14,600. 3. Regarding pain control, I ordered scheduled Toradol. Per patient Morphine does not help. He had Fentanyl at [facility]. Will defer to primary  I  spent 15 minutes counseling the patient face to face.   Lars Pinks PA-C 09/03/2022  11:15 AM  Review of CTA shows mostly atelectatic Right middle and lower lobes with small amount of fluid. CXR today shows improving atelectasis of lung. Will reevaluate with CT scan to see if need for VATS vs ongoing pulmonary toilet

## 2022-09-03 NOTE — Progress Notes (Signed)
Reviewed TCTS plans for CT tomorrow.  Will review peripherally.  Erskine Emery MD PCCM

## 2022-09-03 NOTE — Progress Notes (Signed)
PROGRESS NOTE   Carl Le  ZOX:[id number] DOB: 25-Feb-1981 DOA: 09/02/2022 PCP: Pcp, No  Brief Narrative:   41 year old white male prior nephrolithiasis with no other medical illnesses Admitted to [facility] 10/25 with spasm chest pain after being on Augmentin/doxycycline since 10/22 as OP diagnosis [facility] ED White count 25 10/25-POCUS = right-sided pleural effusion underlying collapse consolidation 10/26 tPA DNase-discussion with CT surgery for transfer 10/28 pleural fibrinolysis again performed transferred to [facility] based course  Community-acquired pneumonia with enlarging parapneumonic effusion/empyema CT scan 10/27 collapsed R mid, lower lobes, loculated pleural effusion decreased compared to prior study 10/25  Continue empiric therapies Zosyn Q8--blood culture, thoracentesis cultures are negative so far with no organisms seen White count has improved from 20 range -14 Critical care saw the patient at [facility] and recommended transfer to [facility] for consideration of VATS as patient has had dornase tPA x2 Pain is uncontrolled despite PCA continuing morphine full dose PCA with oxycodone 5-10 every 6 as needed for breakthrough in addition to Robaxin 500 every 8  Sinus tach 2/2 pain  LFT elevation-US right upper quadrant CT normal Likely secondary to infection  Hyperglycemia A1c 4.7 no SSI at this time   DVT prophylaxis: Lovenox Code Status: Full Family Communication: None Disposition:  Status is: Inpatient Remains inpatient appropriate because:   Pending evaluation from cardiothoracic surgery    Consultants:  Cardiothoracic surgery  Procedures: As above  Antimicrobials: Zosyn   Subjective: Awake coherent no distress but does have 8/10 pain and PCA seems to be beeping and is empty No cough no cold no fever  Objective: Vitals:   09/02/22 2211 09/02/22 2338 09/03/22 0438 09/03/22 0439  BP:  128/83 131/79   Pulse:  (!) 107 (!) 103   Resp:  20 18 19 19   Temp:  98.9 F (37.2 C) 99.2 F (37.3 C)   TempSrc:  Oral Oral   SpO2: 98% 98% 98% 97%  Weight:   80.1 kg   Height:        Intake/Output Summary (Last 24 hours) at 09/03/2022 0743 Last data filed at 09/03/2022 0439 Gross per 24 hour  Intake 365.44 ml  Output 400 ml  Net -34.56 ml   Filed Weights   09/03/22 0438  Weight: 80.1 kg    Examination:  EOMI NCAT no focal deficit no icterus no pallor no rales rhonchi or wheeze Decreased air entry posterolaterally on the right side with chest tube in place ROM intact no focal deficit S1-S2 sinus tach No lower extremity edema Neurologically intact  Data Reviewed: personally reviewed   CBC    Component Value Date/Time   WBC 14.6 (H) 09/03/2022 0106   RBC 4.08 (L) 09/03/2022 0106   HGB 12.7 (L) 09/03/2022 0106   HGB 15.8 05/11/2014 1205   HCT 35.3 (L) 09/03/2022 0106   HCT 45.2 05/11/2014 1205   PLT 273 09/03/2022 0106   PLT 224 05/11/2014 1205   MCV 86.5 09/03/2022 0106   MCV 89 05/11/2014 1205   MCH 31.1 09/03/2022 0106   MCHC 36.0 09/03/2022 0106   RDW 12.8 09/03/2022 0106   RDW 13.1 05/11/2014 1205   LYMPHSABS 0.9 08/31/2022 0255   LYMPHSABS 1.8 05/11/2014 1205   MONOABS 1.8 (H) 08/31/2022 0255   MONOABS 0.4 05/11/2014 1205   EOSABS 0.0 08/31/2022 0255   EOSABS 0.1 05/11/2014 1205   BASOSABS 0.1 08/31/2022 0255   BASOSABS 0.1 05/11/2014 1205      Latest Ref Rng & Units 09/03/2022  1:06 AM 09/01/2022    5:10 AM 08/31/2022    2:55 AM  CMP  Glucose 70 - 99 mg/dL 379  024  097   BUN 6 - 20 mg/dL 10  15  15    Creatinine 0.61 - 1.24 mg/dL  3.53  2.99   Sodium 135 - 145 mmol/L 133  130  135   Potassium 3.5 - 5.1 mmol/L 4.1  4.1  3.7   Chloride 98 - 111 mmol/L 101  98  106   CO2 22 - 32 mmol/L 24  23  21    Calcium 8.9 - 10.3 mg/dL 8.0  8.4  7.8   Total Protein 6.5 - 8.1 g/dL 5.4   6.1   Total Bilirubin 0.3 - 1.2 mg/dL 0.4   2.0   Alkaline Phos 38 - 126 U/L 80   70   AST 15 - 41 U/L 38    21   ALT 0 - 44 U/L 49   46      Radiology Studies: DG Chest Port 1 View  Result Date: 09/02/2022 CLINICAL DATA:  Pleural effusion. EXAM: PORTABLE CHEST 1 VIEW COMPARISON:  09/01/2022 FINDINGS: There is a right pigtail thoracostomy tube overlying the right mid lung. This is unchanged in position from previous exam. No pneumothorax identified. Unchanged appearance of moderate to large right pleural effusion. Similar appearance of small left pleural effusion with overlying atelectasis. IMPRESSION: 1. Stable position of right pigtail thoracostomy tube. No pneumothorax. 2. Unchanged moderate to large right pleural effusion. Electronically Signed   By: 09/04/2022 M.D.   On: 09/02/2022 08:15   CT Angio Chest Pulmonary Embolism (PE) W or WO Contrast  Result Date: 09/01/2022 CLINICAL DATA:  Pneumonia follow-up status post chest tube for parapneumonic effusion. Shortness of breath. EXAM: CT ANGIOGRAPHY CHEST WITH CONTRAST TECHNIQUE: Multidetector CT imaging of the chest was performed using the standard protocol during bolus administration of intravenous contrast. Multiplanar CT image reconstructions and MIPs were obtained to evaluate the vascular anatomy. RADIATION DOSE REDUCTION: This exam was performed according to the departmental dose-optimization program which includes automated exposure control, adjustment of the mA and/or kV according to patient size and/or use of iterative reconstruction technique. CONTRAST:  65mL OMNIPAQUE IOHEXOL 350 MG/ML SOLN COMPARISON:  CT chest dated August 30, 2022. FINDINGS: Cardiovascular: Satisfactory opacification of the pulmonary arteries to the segmental level. No evidence of pulmonary embolism. Normal heart size. No pericardial effusion. No thoracic aortic aneurysm or dissection. Mediastinum/Nodes: No pathologically enlarged mediastinal, hilar, or axillary lymph nodes. Prominent subcentimeter mediastinal lymph nodes again noted, likely reactive. Thyroid gland,  trachea, and esophagus demonstrate no significant findings. Lungs/Pleura: New posterior approach pigtail chest tube at the right lung base. Overall amount of loculated pleural fluid has decreased compared to the prior study 2 days ago, although there is more fluid loculated anteriorly around the right middle lobe compared to the prior. There is a new small pneumothorax component posteriorly as well. No pleural enhancement. The right middle and lower lobes are now completely collapsed with debris and obstruction of the central right middle and lower lobe bronchi. Additional new areas of hypoenhancement centrally with air bronchograms in the right middle and lower lobes. New atelectasis in the anterior inferior right upper lobe. Unchanged mild atelectasis in the left lower lobe. Unchanged mild centrilobular and paraseptal emphysema. Upper Abdomen: No acute abnormality. Musculoskeletal: No chest wall abnormality. Unchanged elevated right hemidiaphragm. No acute or significant osseous findings. Review of the MIP images confirms the above findings.  IMPRESSION: 1. No evidence of pulmonary embolism. 2. New posterior approach pigtail chest tube at the right lung base with new small hydropneumothorax. Overall amount of loculated pleural fluid has decreased compared to the prior study 2 days ago. No pleural enhancement. 3. New complete collapse of the right middle and lower lobes with debris in obstruction of the central bronchi and new areas of hypoenhancement centrally in both lobes, concerning for pneumonia. 4.  Emphysema (ICD10-J43.9). Electronically Signed   By: Obie Dredge M.D.   On: 09/01/2022 10:00     Scheduled Meds:  enoxaparin (LOVENOX) injection  40 mg Subcutaneous Q24H   guaiFENesin  1,200 mg Oral BID   morphine   Intravenous Q4H   nicotine  21 mg Transdermal Daily   [START ON 09/04/2022] pneumococcal 20-valent conjugate vaccine  0.5 mL Intramuscular Tomorrow-1000   Continuous Infusions:   methocarbamol (ROBAXIN) IV Stopped (09/03/22 0051)   piperacillin-tazobactam 3.375 g (09/03/22 0601)     LOS: 1 day   Time spent: 57  Rhetta Mura, MD [facility] To contact the attending provider between 7A-7P or the covering provider during after hours 7P-7A, please log into the web site [email or web address] and access using universal [place] password for that web site. If you do not have the password, please call the hospital operator.  09/03/2022, 7:43 AM

## 2022-09-04 ENCOUNTER — Inpatient Hospital Stay (HOSPITAL_COMMUNITY): Payer: BLUE CROSS/BLUE SHIELD

## 2022-09-04 DIAGNOSIS — J189 Pneumonia, unspecified organism: Secondary | ICD-10-CM | POA: Diagnosis not present

## 2022-09-04 LAB — CULTURE, BLOOD (ROUTINE X 2)
Culture: NO GROWTH
Culture: NO GROWTH
Special Requests: ADEQUATE

## 2022-09-04 LAB — CBC
HCT: 36.2 % — ABNORMAL LOW (ref 39.0–52.0)
Hemoglobin: 12.1 g/dL — ABNORMAL LOW (ref 13.0–17.0)
MCH: 29.7 pg (ref 26.0–34.0)
MCHC: 33.4 g/dL (ref 30.0–36.0)
MCV: 88.7 fL (ref 80.0–100.0)
Platelets: 302 10*3/uL (ref 150–400)
RBC: 4.08 MIL/uL — ABNORMAL LOW (ref 4.22–5.81)
RDW: 12.7 % (ref 11.5–15.5)
WBC: 12.1 10*3/uL — ABNORMAL HIGH (ref 4.0–10.5)
nRBC: 0 % (ref 0.0–0.2)

## 2022-09-04 LAB — LEGIONELLA PNEUMOPHILA SEROGP 1 UR AG: L. pneumophila Serogp 1 Ur Ag: NEGATIVE

## 2022-09-04 MED ORDER — IOHEXOL 350 MG/ML SOLN
100.0000 mL | Freq: Once | INTRAVENOUS | Status: AC | PRN
  Administered 2022-09-04: 65 mL via INTRAVENOUS

## 2022-09-04 NOTE — Progress Notes (Signed)
NAME:  Carl Le, MRN:  [id number], DOB:  1981/01/28, LOS: 2 ADMISSION DATE:  09/02/2022, CONSULTATION DATE:  10/30 REFERRING MD:  Mahala Menghini CHIEF COMPLAINT:  parapneumonic effusion  History of Present Illness:  41 yo male with known tobacco use admitted to [facility] on 10/25 with right lower lobe pneumonia complicated by parapneumonic effusion. Chest tube placed 10/25 and pleural fibrinolysis started 10/26. Repeat imaging on 10/27 showed new complete collapse of the right middle and lower lobes with debris in obstruction of the central bronchi,, patient was transferred to [facility] on 10/28 for cardiothoracic surgery eval.   Pertinent  Medical History  Tobacco use  Significant Hospital Events: Including procedures, antibiotic start and stop dates in addition to other pertinent events   Admitted to [facility] 10/25 Right chest tube placed 10/26 pleural fibrinolysis initiated.  10/28 dornase/tpa  Interim History / Subjective:  No acute events overnight, continues to require 2-3 L [place] supplemental O2. Pain ongoing without complete relief.  On fentanyl PCA.  Chest tube at 20 cm suction with no output recorded.   Objective   Blood pressure 121/76, pulse 94, temperature 98.2 F (36.8 C), temperature source Oral, resp. rate 15, height 6' (1.829 m), weight 80.1 kg, SpO2 96 %.    FiO2 (%):  [0 %-38 %] 0 %   Intake/Output Summary (Last 24 hours) at 09/04/2022 1024 Last data filed at 09/04/2022 6734 Gross per 24 hour  Intake 681.94 ml  Output 680 ml  Net 1.94 ml   Filed Weights   09/03/22 0438  Weight: 80.1 kg    Examination: General: Adult male resting in bed HENT: Normocephalic/Atraumatic Lungs: Symmetric lung expansion with diminished right base, other wise clear lung sounds, no signs of distress Cardiovascular: s1/s2, no murmur, warm and well perfused. No edema Abdomen: soft non tender, non distended Extremities: no deformities Neuro: alert and oriented, no focal deficits  Resolved  Hospital Problem list     Assessment & Plan:  Community acquired pneumonia with parapneumonic effusion -- Antibiotics: Zosyn D 5 -- MRSA nares negative -- Right chest tube placed 10/26 with dornase/tPA x 2.  Last dose 10/28. Continue right chest tube at 20 cm suction -- CT Chest with contrast pending.  Cardiothoracic surgery consultation pending post imaging to eval for VATS vs continued care  Pain control -- Per primary team  Best Practice (right click and "Reselect all SmartList Selections" daily)   Per primary team   Labs   CBC: Recent Labs  Lab 08/30/22 1616 08/31/22 0255 09/01/22 0510 09/03/22 0106 09/04/22 0117  WBC 25.5* 22.1* 24.3* 14.6* 12.1*  NEUTROABS 23.0* 19.1*  --   --   --   HGB 16.2 14.0 13.9 12.7* 12.1*  HCT 47.7 41.9 41.1 35.3* 36.2*  MCV 87.4 87.7 86.5 86.5 88.7  PLT 362 288 277 273 302    Basic Metabolic Panel: Recent Labs  Lab 08/30/22 1616 08/31/22 0255 09/01/22 0510 09/03/22 0106  NA 135 135 130* 133*  K 4.7 3.7 4.1 4.1  CL 103 106 98 101  CO2 20* 21* 23 24  GLUCOSE 150* 116* 159* 131*  BUN 13 15 15 10   CREATININE 0.82 0.78 0.79 0.72  CALCIUM 9.5 7.8* 8.4* 8.0*  MG  --  1.4* 2.0  --   PHOS  --  2.4* 2.4*  --    GFR: Estimated Creatinine Clearance: 136.1 mL/min (by C-G formula based on SCr of 0.72 mg/dL). Recent Labs  Lab 08/30/22 1616 08/30/22 1829 08/31/22 0015 08/31/22 0255  09/01/22 0510 09/03/22 0106 09/04/22 0117  PROCALCITON  --   --   --  2.15  --   --   --   WBC 25.5*  --   --  22.1* 24.3* 14.6* 12.1*  LATICACIDVEN 2.0* 2.2* 1.5 1.1  --   --   --     Liver Function Tests: Recent Labs  Lab 08/30/22 1616 08/31/22 0015 08/31/22 0255 09/03/22 0106  AST 38  --  21 38  ALT 82*  --  46* 49*  ALKPHOS 104  --  70 80  BILITOT 1.9*  --  2.0* 0.4  PROT 8.6*  --  6.1* 5.4*  ALBUMIN 4.3 3.5 3.1* 2.1*   No results for input(s): "LIPASE", "AMYLASE" in the last 168 hours. No results for input(s): "AMMONIA" in the last 168  hours.  ABG    Component Value Date/Time   PHART 7.43 08/30/2022 2131   PCO2ART 36 08/30/2022 2131   PO2ART 69 (L) 08/30/2022 2131   HCO3 23.9 08/30/2022 2131   ACIDBASEDEF 0.1 08/30/2022 2131   O2SAT 97.3 08/30/2022 2131     Coagulation Profile: No results for input(s): "INR", "PROTIME" in the last 168 hours.  Cardiac Enzymes: No results for input(s): "CKTOTAL", "CKMB", "CKMBINDEX", "TROPONINI" in the last 168 hours.  HbA1C: Hgb A1c MFr Bld  Date/Time Value Ref Range Status  08/31/2022 02:55 AM 4.7 (L) 4.8 - 5.6 % Final    Comment:    (NOTE) Pre diabetes:          5.7%-6.4%  Diabetes:              >6.4%  Glycemic control for   <7.0% adults with diabetes     CBG: Recent Labs  Lab 09/02/22 0346 09/02/22 0731 09/02/22 1117 09/02/22 1526 09/02/22 1910  GLUCAP 150* 97 139* 151* 149*

## 2022-09-04 NOTE — Progress Notes (Signed)
PROGRESS NOTE   Carl Le  KNL:[id number] DOB: 09-28-1981 DOA: 09/02/2022 PCP: Pcp, No  Brief Narrative:   41 year old white male prior nephrolithiasis with no other medical illnesses Admitted to [facility] 10/25 with spasm chest pain after being on Augmentin/doxycycline since 10/22 as OP diagnosis [facility] ED White count 25 10/25-POCUS = right-sided pleural effusion underlying collapse consolidation 10/26 tPA DNase-discussion with CT surgery for transfer 10/28 pleural fibrinolysis again performed transferred to [facility] based course  Community-acquired pneumonia with enlarging parapneumonic effusion/empyema CT scan 10/27 collapsed R mid, lower lobes, loculated pleural effusion decreased compared to prior study 10/25  Continue empiric therapies Zosyn Q8--blood culture, thoracentesis cultures 10/26 NGTD, BC x 2 10/25 NGTD White count has improved from 20 range -12 CVTS seeing--will review CT for VATS-- PCA Fentanly--cont oxycodone 5-10 every 6 as needed for breakthrough in addition to Robaxin 500 every 8 Added Valium  Sinus tach 2/2 pain, tube  LFT elevation-US right upper quadrant CT normal Likely secondary to infection  Hyperglycemia A1c 4.7 no SSI at this time   DVT prophylaxis: Lovenox Code Status: Full Family Communication: None Disposition:  Status is: Inpatient Remains inpatient appropriate because:   Pending evaluation from cardiothoracic surgery    Consultants:  Cardiothoracic surgery  Procedures: As above  Antimicrobials: Zosyn   Subjective:  Pain seems controlled a little better after change from Morphine to fentanyl and addition of Valium I went over chest xrays with him and showed him the R side that looked concerning--L side looks a little better  Objective: Vitals:   09/04/22 0800 09/04/22 1211 09/04/22 1225 09/04/22 1554  BP: 121/76 115/79  130/74  Pulse: 94 83  92  Resp: 15 15 15 20   Temp: 98.2 F (36.8 C) 97.9 F (36.6  C)  97.8 F (36.6 C)  TempSrc: Oral Oral  Oral  SpO2: 96% 100% 100% 100%  Weight:      Height:        Intake/Output Summary (Last 24 hours) at 09/04/2022 1611 Last data filed at 09/04/2022 1600 Gross per 24 hour  Intake 709.94 ml  Output 1140 ml  Net -430.06 ml    Filed Weights   09/03/22 0438  Weight: 80.1 kg    Examination:  EOMI NCAT no focal deficit no icterus no pallor no rales rhonchi wheeze --Decreased air entry posterolaterally on the right side --nsome dullness to percussion-- chest tube in place ROM intact no focal deficit S1-S2 sinus tach No lower extremity edema Neurologically intact  Data Reviewed: personally reviewed   CBC    Component Value Date/Time   WBC 12.1 (H) 09/04/2022 0117   RBC 4.08 (L) 09/04/2022 0117   HGB 12.1 (L) 09/04/2022 0117   HGB 15.8 05/11/2014 1205   HCT 36.2 (L) 09/04/2022 0117   HCT 45.2 05/11/2014 1205   PLT 302 09/04/2022 0117   PLT 224 05/11/2014 1205   MCV 88.7 09/04/2022 0117   MCV 89 05/11/2014 1205   MCH 29.7 09/04/2022 0117   MCHC 33.4 09/04/2022 0117   RDW 12.7 09/04/2022 0117   RDW 13.1 05/11/2014 1205   LYMPHSABS 0.9 08/31/2022 0255   LYMPHSABS 1.8 05/11/2014 1205   MONOABS 1.8 (H) 08/31/2022 0255   MONOABS 0.4 05/11/2014 1205   EOSABS 0.0 08/31/2022 0255   EOSABS 0.1 05/11/2014 1205   BASOSABS 0.1 08/31/2022 0255   BASOSABS 0.1 05/11/2014 1205      Latest Ref Rng & Units 09/03/2022    1:06 AM 09/01/2022  5:10 AM 08/31/2022    2:55 AM  CMP  Glucose 70 - 99 mg/dL 831  517  616   BUN 6 - 20 mg/dL 10  15  15    Creatinine 0.61 - 1.24 mg/dL  0.73  7.10   Sodium 135 - 145 mmol/L 133  130  135   Potassium 3.5 - 5.1 mmol/L 4.1  4.1  3.7   Chloride 98 - 111 mmol/L 101  98  106   CO2 22 - 32 mmol/L 24  23  21    Calcium 8.9 - 10.3 mg/dL 8.0  8.4  7.8   Total Protein 6.5 - 8.1 g/dL 5.4   6.1   Total Bilirubin 0.3 - 1.2 mg/dL 0.4   2.0   Alkaline Phos 38 - 126 U/L 80   70   AST 15 - 41 U/L 38   21    ALT 0 - 44 U/L 49   46      Radiology Studies: DG CHEST PORT 1 VIEW  Result Date: 09/03/2022 CLINICAL DATA:  Parapneumonic effusion EXAM: PORTABLE CHEST 1 VIEW COMPARISON:  Prior chest x-ray yesterday FINDINGS: Right-sided chest tube remains in place. Slightly decreased right basilar airspace opacity. Persistent streaky opacities in the left lung base. Stable cardiac and mediastinal contours. No pneumothorax. No acute osseous abnormality. IMPRESSION: 1. Stable position of right-sided chest tube. 2. Improving right basilar airspace opacities. 3. Persistent left basilar atelectasis. Electronically Signed   By: M.D.   On: 09/03/2022 07:58     Scheduled Meds:  enoxaparin (LOVENOX) injection  40 mg Subcutaneous Q24H   fentaNYL   Intravenous Q4H   guaiFENesin  1,200 mg Oral BID   ketorolac  30 mg Intravenous Q6H   nicotine  21 mg Transdermal Daily   pneumococcal 20-valent conjugate vaccine  0.5 mL Intramuscular Tomorrow-1000   Continuous Infusions:  methocarbamol (ROBAXIN) IV Stopped (09/04/22 0433)   piperacillin-tazobactam 3.375 g (09/04/22 1218)     LOS: 2 days   Time spent: 25  09/06/22, MD [facility] To contact the attending provider between 7A-7P or the covering provider during after hours 7P-7A, please log into the web site [email or web address] and access using universal [place] password for that web site. If you do not have the password, please call the hospital operator.  09/04/2022, 4:11 PM

## 2022-09-05 DIAGNOSIS — J189 Pneumonia, unspecified organism: Secondary | ICD-10-CM | POA: Diagnosis not present

## 2022-09-05 NOTE — Progress Notes (Signed)
     [address]       [place] [address]             [phone]       Case discussed, and images reviewed with patient Good result from chest tube drainage.  Only small residual pocket remaining.  RLL consolidation noted.  Recs: Transition to [facility]. IS, and ambulation Continue abx No plans for surgery at this point   Carl Le

## 2022-09-05 NOTE — Progress Notes (Signed)
NAME:  Carl Le, MRN:  [id number], DOB:  1980-12-19, LOS: 3 ADMISSION DATE:  09/02/2022, CONSULTATION DATE:  10/30 REFERRING MD:  Mahala Menghini CHIEF COMPLAINT:  parapneumonic effusion  History of Present Illness:  41 yo male with known tobacco use admitted to [facility] on 10/25 with right lower lobe pneumonia complicated by parapneumonic effusion. Chest tube placed 10/25 and pleural fibrinolysis started 10/26. Repeat imaging on 10/27 showed new complete collapse of the right middle and lower lobes with debris in obstruction of the central bronchi,, patient was transferred to [facility] on 10/28 for cardiothoracic surgery eval.   Pertinent  Medical History  Tobacco use  Significant Hospital Events: Including procedures, antibiotic start and stop dates in addition to other pertinent events   Admitted to [facility] 10/25 Right chest tube placed 10/26 pleural fibrinolysis initiated.  10/28 dornase/tpa  Interim History / Subjective:  No sig change overnight. Still having pain despite fentanyl PCA, oxyIR, PRN valium, Toradol Mild SOB Repeat CT reviewed with pt   Objective   Blood pressure 125/76, pulse 86, temperature 98.1 F (36.7 C), temperature source Oral, resp. rate 20, height 6' (1.829 m), weight 80.1 kg, SpO2 99 %.    FiO2 (%):  [38 %] 38 %   Intake/Output Summary (Last 24 hours) at 09/05/2022 1229 Last data filed at 09/04/2022 2109 Gross per 24 hour  Intake 287 ml  Output 410 ml  Net -123 ml   Filed Weights   09/03/22 0438  Weight: 80.1 kg    Examination: General: Adult male resting in bed, NAD HENT: Normocephalic/Atraumatic Lungs: resps even non labored, diminished right base, other wise clear lung sounds, CT to suction, no air leak  Cardiovascular: s1/s2, no murmur, warm and well perfused. No edema Abdomen: soft non tender, non distended Extremities: no deformities Neuro: alert and oriented, no focal deficits  Resolved Hospital Problem list     Assessment & Plan:   Community acquired pneumonia with parapneumonic effusion -- Antibiotics: Zosyn D6/X -- MRSA nares negative -- Right chest tube placed 10/26 with dornase/tPA x 2.  Last dose 10/28. Continue right chest tube at 20 cm suction -- repeat CT Chest  shows persistent posterior and superior loculation despite CT, abx, lytics. May  need R VATS decortication. Discussed at length with pt and he is amenable if this is deemed to be the best plan. D/w CVTS PA   Pain control -- Per primary team  Best Practice (right click and "Reselect all SmartList Selections" daily)   Per primary team    Dirk Dress, NP Pulmonary/Critical Care Medicine  09/05/2022  12:29 PM   Labs   CBC: Recent Labs  Lab 08/30/22 1616 08/31/22 0255 09/01/22 0510 09/03/22 0106 09/04/22 0117  WBC 25.5* 22.1* 24.3* 14.6* 12.1*  NEUTROABS 23.0* 19.1*  --   --   --   HGB 16.2 14.0 13.9 12.7* 12.1*  HCT 47.7 41.9 41.1 35.3* 36.2*  MCV 87.4 87.7 86.5 86.5 88.7  PLT 362 288 277 273 302    Basic Metabolic Panel: Recent Labs  Lab 08/30/22 1616 08/31/22 0255 09/01/22 0510 09/03/22 0106  NA 135 135 130* 133*  K 4.7 3.7 4.1 4.1  CL 103 106 98 101  CO2 20* 21* 23 24  GLUCOSE 150* 116* 159* 131*  BUN 13 15 15 10   CREATININE 0.82 0.78 0.79 0.72  CALCIUM 9.5 7.8* 8.4* 8.0*  MG  --  1.4* 2.0  --   PHOS  --  2.4* 2.4*  --  GFR: Estimated Creatinine Clearance: 136.1 mL/min (by C-G formula based on SCr of 0.72 mg/dL). Recent Labs  Lab 08/30/22 1616 08/30/22 1829 08/31/22 0015 08/31/22 0255 09/01/22 0510 09/03/22 0106 09/04/22 0117  PROCALCITON  --   --   --  2.15  --   --   --   WBC 25.5*  --   --  22.1* 24.3* 14.6* 12.1*  LATICACIDVEN 2.0* 2.2* 1.5 1.1  --   --   --     Liver Function Tests: Recent Labs  Lab 08/30/22 1616 08/31/22 0015 08/31/22 0255 09/03/22 0106  AST 38  --  21 38  ALT 82*  --  46* 49*  ALKPHOS 104  --  70 80  BILITOT 1.9*  --  2.0* 0.4  PROT 8.6*  --  6.1* 5.4*  ALBUMIN 4.3  3.5 3.1* 2.1*   No results for input(s): "LIPASE", "AMYLASE" in the last 168 hours. No results for input(s): "AMMONIA" in the last 168 hours.  ABG    Component Value Date/Time   PHART 7.43 08/30/2022 2131   PCO2ART 36 08/30/2022 2131   PO2ART 69 (L) 08/30/2022 2131   HCO3 23.9 08/30/2022 2131   ACIDBASEDEF 0.1 08/30/2022 2131   O2SAT 97.3 08/30/2022 2131     Coagulation Profile: No results for input(s): "INR", "PROTIME" in the last 168 hours.  Cardiac Enzymes: No results for input(s): "CKTOTAL", "CKMB", "CKMBINDEX", "TROPONINI" in the last 168 hours.  HbA1C: Hgb A1c MFr Bld  Date/Time Value Ref Range Status  08/31/2022 02:55 AM 4.7 (L) 4.8 - 5.6 % Final    Comment:    (NOTE) Pre diabetes:          5.7%-6.4%  Diabetes:              >6.4%  Glycemic control for   <7.0% adults with diabetes     CBG: Recent Labs  Lab 09/02/22 0346 09/02/22 0731 09/02/22 1117 09/02/22 1526 09/02/22 1910  GLUCAP 150* 97 139* 151* 149*

## 2022-09-05 NOTE — Progress Notes (Signed)
PROGRESS NOTE   Carl Le  ATF:[id number] DOB: Aug 01, 1981 DOA: 09/02/2022 PCP: Oneita Hurt, No  Brief Narrative:   41 year old white male prior nephrolithiasis with no other medical illnesses Admitted to [facility] 10/25 with spasm chest pain after being on Augmentin/doxycycline since 10/22 as OP diagnosis [facility] ED White count 25 10/25-POCUS = right-sided pleural effusion underlying collapse consolidation 10/26 tPA DNase-discussion with CT surgery for transfer 10/28 pleural fibrinolysis again performed transferred to [facility] based course  Community-acquired pneumonia with enlarging parapneumonic effusion/empyema CT scan 10/27 collapsed R mid, lower lobes, loculated pleural effusion decreased compared to prior study 10/25  Continue empiric therapies Zosyn Q8--blood culture, thoracentesis cultures 10/26 NGTD, BC x 2 10/25 NGTD White count has improved from 20 range -12  repeat labs in a.m. CVTS provided opinion about findings and feel can transition to [facility]-and no need for any surgery-defer to them for further management   PCA Fentanly--cont oxycodone 5-10 every 6 as needed for breakthrough in addition to Robaxin 500 every 8 Added Valium  Sinus tach 2/2 pain, tube  LFT elevation-US right upper quadrant CT normal Likely secondary to infection  Hyperglycemia A1c 4.7 no SSI at this time   DVT prophylaxis: Lovenox Code Status: Full Family Communication: None Disposition:  Status is: Inpatient Remains inpatient appropriate because:   Improvement in removal of chest tube as per cardiovascular surgery    Consultants:  Cardiothoracic surgery  Procedures: As above  Antimicrobials: Zosyn   Subjective:  Overall looks fair--remains on PCA No fever chills  Objective: Vitals:   09/04/22 2333 09/05/22 0342 09/05/22 0830 09/05/22 0900  BP: 119/72 113/78  125/76  Pulse:  82  86  Resp: 15 18 18 20   Temp: 98.3 F (36.8 C) 97.9 F (36.6 C)  98.1 F (36.7  C)  TempSrc: Oral Oral  Oral  SpO2: 100% 99% 98% 99%  Weight:      Height:        Intake/Output Summary (Last 24 hours) at 09/05/2022 1604 Last data filed at 09/04/2022 2109 Gross per 24 hour  Intake 287 ml  Output 0 ml  Net 287 ml    Filed Weights   09/03/22 0438  Weight: 80.1 kg    Examination:  EOMI NCAT no focal deficit no icterus no pallor no rales rhonchi wheeze --Decreased air entry posterolaterally on the right side-drainage seems to have slowed Abdomen is soft no rebound no guarding ROM is intact moving 4 limbs  Data Reviewed: personally reviewed   CBC    Component Value Date/Time   WBC 12.1 (H) 09/04/2022 0117   RBC 4.08 (L) 09/04/2022 0117   HGB 12.1 (L) 09/04/2022 0117   HGB 15.8 05/11/2014 1205   HCT 36.2 (L) 09/04/2022 0117   HCT 45.2 05/11/2014 1205   PLT 302 09/04/2022 0117   PLT 224 05/11/2014 1205   MCV 88.7 09/04/2022 0117   MCV 89 05/11/2014 1205   MCH 29.7 09/04/2022 0117   MCHC 33.4 09/04/2022 0117   RDW 12.7 09/04/2022 0117   RDW 13.1 05/11/2014 1205   LYMPHSABS 0.9 08/31/2022 0255   LYMPHSABS 1.8 05/11/2014 1205   MONOABS 1.8 (H) 08/31/2022 0255   MONOABS 0.4 05/11/2014 1205   EOSABS 0.0 08/31/2022 0255   EOSABS 0.1 05/11/2014 1205   BASOSABS 0.1 08/31/2022 0255   BASOSABS 0.1 05/11/2014 1205      Latest Ref Rng & Units 09/03/2022    1:06 AM 09/01/2022    5:10 AM 08/31/2022    2:55  AM  CMP  Glucose 70 - 99 mg/dL 131  159  116   BUN 6 - 20 mg/dL 10  15  15    Creatinine 0.61 - 1.24 mg/dL 0.72  0.79  0.78   Sodium 135 - 145 mmol/L 133  130  135   Potassium 3.5 - 5.1 mmol/L 4.1  4.1  3.7   Chloride 98 - 111 mmol/L 101  98  106   CO2 22 - 32 mmol/L 24  23  21    Calcium 8.9 - 10.3 mg/dL 8.0  8.4  7.8   Total Protein 6.5 - 8.1 g/dL 5.4   6.1   Total Bilirubin 0.3 - 1.2 mg/dL 0.4   2.0   Alkaline Phos 38 - 126 U/L 80   70   AST 15 - 41 U/L 38   21   ALT 0 - 44 U/L 49   46      Radiology Studies: CT CHEST W  CONTRAST  Result Date: 09/04/2022 CLINICAL DATA:  Sepsis.  Parapneumonic effusion. EXAM: CT CHEST WITH CONTRAST TECHNIQUE: Multidetector CT imaging of the chest was performed during intravenous contrast administration. RADIATION DOSE REDUCTION: This exam was performed according to the departmental dose-optimization program which includes automated exposure control, adjustment of the mA and/or kV according to patient size and/or use of iterative reconstruction technique. CONTRAST:  14mL OMNIPAQUE IOHEXOL 350 MG/ML SOLN COMPARISON:  September 01, 2022. FINDINGS: Cardiovascular: No significant vascular findings. Normal heart size. No pericardial effusion. Mediastinum/Nodes: No enlarged mediastinal, hilar, or axillary lymph nodes. Thyroid gland, trachea, and esophagus demonstrate no significant findings. Lungs/Pleura: No pneumothorax is noted. There is interval development of multiple patchy airspace opacities in left upper lobe concerning for multifocal pneumonia. Continued presence of pigtail drainage catheter in the right lung base. Grossly stable appearance loculated right pleural effusion with associated right lower lobe pneumonia and probable right middle lobe pneumonia. Mild left basilar subsegmental atelectasis is noted. Upper Abdomen: No acute abnormality. Musculoskeletal: No chest wall abnormality. No acute or significant osseous findings. IMPRESSION: Stable position of pigtail drainage catheter in right lung base, with grossly stable appearance of loculated right pleural effusion and associated right lower lobe and right middle lobe pneumonia. Mild left basilar subsegmental atelectasis is noted. However, there is interval development of multiple patchy airspace opacities in the left upper lobe concerning for multifocal pneumonia. Electronically Signed   By: Marijo Conception M.D.   On: 09/04/2022 16:16     Scheduled Meds:  enoxaparin (LOVENOX) injection  40 mg Subcutaneous Q24H   fentaNYL   Intravenous  Q4H   guaiFENesin  1,200 mg Oral BID   ketorolac  30 mg Intravenous Q6H   nicotine  21 mg Transdermal Daily   pneumococcal 20-valent conjugate vaccine  0.5 mL Intramuscular Tomorrow-1000   Continuous Infusions:  methocarbamol (ROBAXIN) IV Stopped (09/04/22 0433)   piperacillin-tazobactam 3.375 g (09/05/22 0442)     LOS: 3 days   Time spent: 69  Nita Sells, MD [facility] To contact the attending provider between 7A-7P or the covering provider during after hours 7P-7A, please log into the web site [email or web address] and access using universal [place] password for that web site. If you do not have the password, please call the hospital operator.  09/05/2022, 4:04 PM

## 2022-09-05 NOTE — Plan of Care (Signed)
  Problem: Health Behavior/Discharge Planning: Goal: Ability to manage health-related needs will improve Outcome: Progressing   Problem: Clinical Measurements: Goal: Ability to maintain clinical measurements within normal limits will improve Outcome: Progressing Goal: Will remain free from infection Outcome: Progressing   Problem: Activity: Goal: Risk for activity intolerance will decrease Outcome: Progressing   

## 2022-09-06 ENCOUNTER — Telehealth: Payer: Self-pay | Admitting: Pulmonary Disease

## 2022-09-06 DIAGNOSIS — J9601 Acute respiratory failure with hypoxia: Secondary | ICD-10-CM

## 2022-09-06 DIAGNOSIS — R000 Tachycardia, unspecified: Secondary | ICD-10-CM

## 2022-09-06 DIAGNOSIS — Z720 Tobacco use: Secondary | ICD-10-CM

## 2022-09-06 DIAGNOSIS — J918 Pleural effusion in other conditions classified elsewhere: Secondary | ICD-10-CM

## 2022-09-06 DIAGNOSIS — E871 Hypo-osmolality and hyponatremia: Secondary | ICD-10-CM

## 2022-09-06 LAB — CBC
HCT: 33.9 % — ABNORMAL LOW (ref 39.0–52.0)
Hemoglobin: 11.8 g/dL — ABNORMAL LOW (ref 13.0–17.0)
MCH: 30.2 pg (ref 26.0–34.0)
MCHC: 34.8 g/dL (ref 30.0–36.0)
MCV: 86.7 fL (ref 80.0–100.0)
Platelets: 341 10*3/uL (ref 150–400)
RBC: 3.91 MIL/uL — ABNORMAL LOW (ref 4.22–5.81)
RDW: 12.7 % (ref 11.5–15.5)
WBC: 10.1 10*3/uL (ref 4.0–10.5)
nRBC: 0 % (ref 0.0–0.2)

## 2022-09-06 LAB — BASIC METABOLIC PANEL
Anion gap: 9 (ref 5–15)
BUN: 8 mg/dL (ref 6–20)
CO2: 24 mmol/L (ref 22–32)
Calcium: 8 mg/dL — ABNORMAL LOW (ref 8.9–10.3)
Chloride: 104 mmol/L (ref 98–111)
Creatinine, Ser: 0.87 mg/dL (ref 0.61–1.24)
GFR, Estimated: >60 mL/min (ref >60)
Glucose, Bld: 135 mg/dL — ABNORMAL HIGH (ref 70–99)
Potassium: 3.5 mmol/L (ref 3.5–5.1)
Sodium: 137 mmol/L (ref 135–145)

## 2022-09-06 NOTE — Telephone Encounter (Signed)
Please schedule follow up with Dr. Genia Harold in 4-6 weeks for hospital follow up of pleural effusion.  Thanks, JD

## 2022-09-06 NOTE — Telephone Encounter (Signed)
Patient is scheduled on 12/5 at 11am for HFU with Dr. Genia Harold in [place]. Reminder mailed to address on file- nothing further needed.

## 2022-09-06 NOTE — Progress Notes (Signed)
Mobility Specialist Progress Note:   09/06/22 1156  Mobility  Activity Ambulated with assistance in hallway  Level of Assistance Standby assist, set-up cues, supervision of patient - no hands on  Assistive Device None  Distance Ambulated (ft) 550 ft  Activity Response Tolerated well  $Mobility charge 1 Mobility   Pt received in bed willing to participate in mobility. No complaints of pain. O2 remained above 95% throughout ambulation on RA. Left at sink to get washed up.   [facility] Surveyor, mining Chat only

## 2022-09-06 NOTE — Progress Notes (Signed)
PROGRESS NOTE    Carl Le  ZOX:[id number] DOB: 12/23/1980 DOA: 09/02/2022 PCP: Pcp, No   Brief Narrative:  The patient is a 41 year old white male prior nephrolithiasis with no other medical illnesses Ad[facility]25 with spasm chest pain after being on Augmentin/doxycycline since 10/22 as [facility] White count 25 10/25-POCUS = right-sided pleural effusion underlying collapse consolidation 10/26 tPA DNase-discussion with CT surgery for transfer 10/28 pleural fibrinolysis again performed transferred to [facility]  CT Surgery evaluated and transitioned the patient to waterseal and now Chest Tube is removed.   Assessment and Plan: * Community acquired pneumonia of right lower lobe of lung Complicated right-sided parapneumonic effusion -CT scan 10/27 collapsed R mid, lower lobes, loculated pleural effusion decreased compared to prior study 10/25  -Continue empiric therapies Zosyn Q8--blood culture, thoracentesis cultures 10/26 NGTD, BC x 2 10/25 NGTD -White count has improved from 20 range and is now normalized at 10.1 -CVTS provided opinion about findings and feel can transition to waterseal-and no need for any surgery-defer to them for further management; chest tube was removed today -PCA fentanyl was initiated but will need to transition and start weaning off of narcotics -Cont oxycodone 5-10 every 6 as needed for breakthrough in addition to Robaxin 500 every 8 we will need to start weaning and repeat chest x-ray in a.m. -Added Valium  Acute respiratory failure with hypoxia (HCC) -Secondary to parapneumonic effusion.  Stable on 3-4 L O2 via [place]. -SpO2: 98 % O2 Flow Rate (L/min): 0 L/min FiO2 (%): 35 % -Continuous pulse oximetry maintain O2 saturation greater than 90% -Continue supplemental oxygen via nasal cannula and wean O2 as tolerating -the patient will need Le ambulatory home O2 screen and repeat chest x-ray in a.m.  Sinus Tachycardia -Secondary to  uncontrolled pain and anxiety.  He was on morphine PCA, fentanyl, Dilaudid, oxycodone prn, Valium at previous hospital.  - -He is asking for all of these on arrival. -Continue fentanyl PCA and wean -Tylenol and oxycodone as needed -Atarax as needed for anxiety  Tobacco use -Reports smoking 1 pack/day for many years.  Continue nicotine patch. -Smoking cessation counseling given  Hyponatremia -Sodium has improved to 137 -Continue monitor and trend and repeat CMP in a.m.  LFT elevation -CT normal -Check LFTs in the morning -Likely secondary to infection   Hyperglycemia  -Likely reactive -A1c 4.7 no SSI at this time   Normocytic Anemia -Possibly dilutional drop but will need to continue monitor carefully -Patient's hemoglobin/hematocrit went from 13.9/41.1 -> 12.7/35.3 -> 12.1/36.2 -> 11.8/33.9 -Check Anemia Panel in the AM -Continue to Monitor for S/Sx of Bleeding; No overt bleeding noted -Repeat CBC in the AM   DVT prophylaxis: enoxaparin (LOVENOX) injection 40 mg Start: 09/02/22 2245    Code Status: Full Code Family Communication: No family present at bedside   Disposition Plan:  Level of care: Progressive Status is: Inpatient Remains inpatient appropriate because: Needs Le ambulatory Home O2 Screen prior to D/C   Consultants:  Pulmonary CT Surgery  Procedures:  As above  Antimicrobials:  Anti-infectives (From admission, onward)    Start     Dose/Rate Route Frequency Ordered Stop   09/02/22 2245  piperacillin-tazobactam (ZOSYN) IVPB 3.375 g        3.375 g 12.5 mL/hr over 240 Minutes Intravenous Every 8 hours 09/02/22 2151         Subjective: Seen and examined at bedside and he was still having some discomfort and some slight shortness of breath.  No nausea or vomiting.  Denies any lightheadedness or dizziness.  No other concerns or complaints at this time.  Objective: Vitals:   09/06/22 0000 09/06/22 0049 09/06/22 0404 09/06/22 0419  BP: 132/79   123/86   Pulse: 95   87  Resp: 20 20 20 14   Temp: 98.1 F (36.7 C)   98.3 F (36.8 C)  TempSrc: Oral   Oral  SpO2: 95% 97% 97% 97%  Weight:      Height:        Intake/Output Summary (Last 24 hours) at 09/06/2022 0805 Last data filed at 09/06/2022 0536 Gross per 24 hour  Intake --  Output 850 ml  Net -850 ml   Filed Weights   09/03/22 0438  Weight: 80.1 kg   Examination: Physical Exam:  Constitutional: Caucasian male in no acute distress Respiratory: Diminished to auscultation bilaterally with coarse breath sounds, no wheezing, rales, rhonchi or crackles. Normal respiratory effort and patient is not tachypenic. No accessory muscle use.  Unlabored breathing but is wearing supplemental oxygen via nasal cannula and has a chest tube in place Cardiovascular: RRR, no murmurs / rubs / gallops. S1 and S2 auscultated. No extremity edema.  Abdomen: Soft, non-tender, non-distended. Bowel sounds positive.  GU: Deferred. Musculoskeletal: No clubbing / cyanosis of digits/nails. No joint deformity upper and lower extremities. Skin: No rashes, lesions, ulcers limited skin evaluation. No induration; Warm and dry.  Neurologic: CN 2-12 grossly intact with no focal deficits.  Romberg sign and cerebellar reflexes not assessed.  Psychiatric: Normal judgment and insight. Alert and oriented x 3. Normal mood and appropriate affect.   Data Reviewed: I have personally reviewed following labs and imaging studies  CBC: Recent Labs  Lab 08/30/22 1616 08/31/22 0255 09/01/22 0510 09/03/22 0106 09/04/22 0117 09/06/22 0158  WBC 25.5* 22.1* 24.3* 14.6* 12.1* 10.1  NEUTROABS 23.0* 19.1*  --   --   --   --   HGB 16.2 14.0 13.9 12.7* 12.1* 11.8*  HCT 47.7 41.9 41.1 35.3* 36.2* 33.9*  MCV 87.4 87.7 86.5 86.5 88.7 86.7  PLT 362 288 277 273 302 341   Basic Metabolic Panel: Recent Labs  Lab 08/30/22 1616 08/31/22 0255 09/01/22 0510 09/03/22 0106 09/06/22 0158  NA 135 135 130* 133* 137  K 4.7 3.7 4.1 4.1  3.5  CL 103 106 98 101 104  CO2 20* 21* 23 24 24   GLUCOSE 150* 116* 159* 131* 135*  BUN 13 15 15 10 8   CREATININE 0.82 0.78 0.79 0.72 0.87  CALCIUM 9.5 7.8* 8.4* 8.0* 8.0*  MG  --  1.4* 2.0  --   --   PHOS  --  2.4* 2.4*  --   --    GFR: Estimated Creatinine Clearance: 125.1 mL/min (by C-G formula based on SCr of 0.87 mg/dL). Liver Function Tests: Recent Labs  Lab 08/30/22 1616 08/31/22 0015 08/31/22 0255 09/03/22 0106  AST 38  --  21 38  ALT 82*  --  46* 49*  ALKPHOS 104  --  70 80  BILITOT 1.9*  --  2.0* 0.4  PROT 8.6*  --  6.1* 5.4*  ALBUMIN 4.3 3.5 3.1* 2.1*   No results for input(s): "LIPASE", "AMYLASE" in the last 168 hours. No results for input(s): "AMMONIA" in the last 168 hours. Coagulation Profile: No results for input(s): "INR", "PROTIME" in the last 168 hours. Cardiac Enzymes: No results for input(s): "CKTOTAL", "CKMB", "CKMBINDEX", "TROPONINI" in the last 168 hours. BNP (last 3 results) No results  for input(s): "PROBNP" in the last 8760 hours. HbA1C: No results for input(s): "HGBA1C" in the last 72 hours. CBG: Recent Labs  Lab 09/02/22 0346 09/02/22 0731 09/02/22 1117 09/02/22 1526 09/02/22 1910  GLUCAP 150* 97 139* 151* 149*   Lipid Profile: No results for input(s): "CHOL", "HDL", "LDLCALC", "TRIG", "CHOLHDL", "LDLDIRECT" in the last 72 hours. Thyroid Function Tests: No results for input(s): "TSH", "T4TOTAL", "FREET4", "T3FREE", "THYROIDAB" in the last 72 hours. Anemia Panel: No results for input(s): "VITAMINB12", "FOLATE", "FERRITIN", "TIBC", "IRON", "RETICCTPCT" in the last 72 hours. Sepsis Labs: Recent Labs  Lab 08/30/22 1616 08/30/22 1829 08/31/22 0015 08/31/22 0255  PROCALCITON  --   --   --  2.15  LATICACIDVEN 2.0* 2.2* 1.5 1.1   Recent Results (from the past 240 hour(s))  Culture, blood (routine x 2)     Status: None   Collection Time: 08/30/22  4:16 PM   Specimen: BLOOD RIGHT HAND  Result Value Ref Range Status   Specimen  Description BLOOD RIGHT HAND  Final   Special Requests   Final    BOTTLES DRAWN AEROBIC AND ANAEROBIC Blood Culture adequate volume   Culture   Final    NO GROWTH 5 DAYS Performed at [facility], [address] [place], [place] [address]    Report Status 09/04/2022 FINAL  Final  Culture, blood (routine x 2)     Status: None   Collection Time: 08/30/22  4:16 PM   Specimen: Right Antecubital; Blood  Result Value Ref Range Status   Specimen Description RIGHT ANTECUBITAL  Final   Special Requests   Final    BOTTLES DRAWN AEROBIC AND ANAEROBIC Blood Culture results may not be optimal due to Le excessive volume of blood received in culture bottles   Culture   Final    NO GROWTH 5 DAYS Performed at [facility], [address] [place], [place] [address]    Report Status 09/04/2022 FINAL  Final  SARS Coronavirus 2 by RT PCR (hospital order, performed in [facility] hospital lab) *cepheid single result test* Anterior Nasal Swab     Status: None   Collection Time: 08/30/22  4:55 PM   Specimen: Anterior Nasal Swab  Result Value Ref Range Status   SARS Coronavirus 2 by RT PCR NEGATIVE NEGATIVE Final    Comment: (NOTE) SARS-CoV-2 target nucleic acids are NOT DETECTED.  The SARS-CoV-2 RNA is generally detectable in upper and lower respiratory specimens during the acute phase of infection. The lowest concentration of SARS-CoV-2 viral copies this assay can detect is 250 copies / mL. A negative result does not preclude SARS-CoV-2 infection and should not be used as the sole basis for treatment or other patient management decisions.  A negative result may occur with improper specimen collection / handling, submission of specimen other than nasopharyngeal swab, presence of viral mutation(s) within the areas targeted by this assay, and inadequate number of viral copies (<250 copies / mL). A negative result must be combined with clinical observations, patient history, and  epidemiological information.  Fact Sheet for Patients:   https://www.patel.info/  Fact Sheet for Healthcare Providers: https://hall.com/  This test is not yet approved or  cleared by the [place] FDA and has been authorized for detection and/or diagnosis of SARS-CoV-2 by FDA under [place] Emergency Use Authorization (EUA).  This EUA will remain in effect (meaning this test can be used) for the duration of the COVID-19 declaration under Section 564(b)(1) of the Act, 21 U.S.C. section 360bbb-3(b)(1),  unless the authorization is terminated or revoked sooner.  Performed at [facility], [address] [place], [place] [address]   MRSA Next Gen by PCR, Nasal     Status: None   Collection Time: 08/31/22 12:15 AM   Specimen: Nasal Mucosa; Nasal Swab  Result Value Ref Range Status   MRSA by PCR Next Gen NOT DETECTED NOT DETECTED Final    Comment: (NOTE) The GeneXpert MRSA Assay (FDA approved for NASAL specimens only), is one component of a comprehensive MRSA colonization surveillance program. It is not intended to diagnose MRSA infection nor to guide or monitor treatment for MRSA infections. Test performance is not FDA approved in patients less than 62 years old. Performed at [facility], [address] [place], [place] [address]   Pleural fluid culture w Gram Stain     Status: None   Collection Time: 08/31/22 12:35 AM   Specimen: Pleural Fluid  Result Value Ref Range Status   Specimen Description   Final    PLEURAL Performed at [facility], [address], [place], [place] [address]    Special Requests   Final    NONE Performed at [facility], [address] [place], [place] [address]    Gram Stain   Final    MODERATE WBC PRESENT, PREDOMINANTLY PMN NO ORGANISMS SEEN    Culture   Final    NO GROWTH 3 DAYS Performed at [facility], [address] [place], [place] [address]     Report Status 09/03/2022 FINAL  Final    Radiology Studies: CT CHEST W CONTRAST  Result Date: 09/04/2022 CLINICAL DATA:  Sepsis.  Parapneumonic effusion. EXAM: CT CHEST WITH CONTRAST TECHNIQUE: Multidetector CT imaging of the chest was performed during intravenous contrast administration. RADIATION DOSE REDUCTION: This exam was performed according to the departmental dose-optimization program which includes automated exposure control, adjustment of the mA and/or kV according to patient size and/or use of iterative reconstruction technique. CONTRAST:  84mL OMNIPAQUE IOHEXOL 350 MG/ML SOLN COMPARISON:  September 01, 2022. FINDINGS: Cardiovascular: No significant vascular findings. Normal heart size. No pericardial effusion. Mediastinum/Nodes: No enlarged mediastinal, hilar, or axillary lymph nodes. Thyroid gland, trachea, and esophagus demonstrate no significant findings. Lungs/Pleura: No pneumothorax is noted. There is interval development of multiple patchy airspace opacities in left upper lobe concerning for multifocal pneumonia. Continued presence of pigtail drainage catheter in the right lung base. Grossly stable appearance loculated right pleural effusion with associated right lower lobe pneumonia and probable right middle lobe pneumonia. Mild left basilar subsegmental atelectasis is noted. Upper Abdomen: No acute abnormality. Musculoskeletal: No chest wall abnormality. No acute or significant osseous findings. IMPRESSION: Stable position of pigtail drainage catheter in right lung base, with grossly stable appearance of loculated right pleural effusion and associated right lower lobe and right middle lobe pneumonia. Mild left basilar subsegmental atelectasis is noted. However, there is interval development of multiple patchy airspace opacities in the left upper lobe concerning for multifocal pneumonia. Electronically Signed   By: Lupita Raider M.D.   On: 09/04/2022 16:16    Scheduled Meds:  enoxaparin  (LOVENOX) injection  40 mg Subcutaneous Q24H   fentaNYL   Intravenous Q4H   guaiFENesin  1,200 mg Oral BID   ketorolac  30 mg Intravenous Q6H   nicotine  21 mg Transdermal Daily   pneumococcal 20-valent conjugate vaccine  0.5 mL Intramuscular Tomorrow-1000   Continuous Infusions:  methocarbamol (ROBAXIN) IV Stopped (09/04/22 0433)  piperacillin-tazobactam 3.375 g (09/06/22 0531)    LOS: 4 days   Marguerita Merles, DO [facility] Available via Epic secure chat 7am-7pm After these hours, please refer to coverage provider listed on [email or web address] 09/06/2022, 8:05 AM

## 2022-09-06 NOTE — Progress Notes (Signed)
NAME:  Carl Le, MRN:  [id number], DOB:  January 10, 1981, LOS: 4 ADMISSION DATE:  09/02/2022, CONSULTATION DATE:  10/30 REFERRING MD:  Mahala Menghini CHIEF COMPLAINT:  parapneumonic effusion  History of Present Illness:  41 yo male with known tobacco use admitted to [facility] on 10/25 with right lower lobe pneumonia complicated by parapneumonic effusion. Chest tube placed 10/25 and pleural fibrinolysis started 10/26. Repeat imaging on 10/27 showed new complete collapse of the right middle and lower lobes with debris in obstruction of the central bronchi,, patient was transferred to [facility] on 10/28 for cardiothoracic surgery eval.   Pertinent  Medical History  Tobacco use  Significant Hospital Events: Including procedures, antibiotic start and stop dates in addition to other pertinent events   Admitted to [facility] 10/25 Right chest tube placed 10/26 pleural fibrinolysis initiated.  10/28 dornase/tpa  Interim History / Subjective:  -850 ml I/O, neg 1.4 L this admission. Chest tube to water seal yesterday per CVTS.  Minimal output, 20 ml.  Remains on 2 L [place].  Pain control of right chest improving   Objective   Blood pressure 123/86, pulse 87, temperature 98.3 F (36.8 C), temperature source Oral, resp. rate 14, height 6' (1.829 m), weight 80.1 kg, SpO2 97 %.    FiO2 (%):  [35 %-38 %] 35 %   Intake/Output Summary (Last 24 hours) at 09/06/2022 0813 Last data filed at 09/06/2022 0536 Gross per 24 hour  Intake --  Output 850 ml  Net -850 ml    Filed Weights   09/03/22 0438  Weight: 80.1 kg    Examination: General: Adult male resting in bed, NAD HENT: Normocephalic/Atraumatic Lungs: resps even non labored, diminished right base, other wise clear lung sounds, CT, no air leak  Cardiovascular: s1/s2, no murmur, warm and well perfused. No edema Abdomen: soft non tender, non distended Extremities: no deformities Neuro: alert and oriented, no focal deficits  Resolved Hospital Problem list      Assessment & Plan:  Community acquired pneumonia with parapneumonic effusion -- Antibiotics: Zosyn D7/X -- MRSA nares negative -- Right chest tube placed 10/26 with dornase/tPA x 2.  Last dose 10/28.  -- Chest tube without air leak and minimal output, not in location of loculated effusion on most recent imaging.  Discussed with CVTS and plans to remove today. No plans for intervention.  -- Recommend pulmonary hygiene: Flutter, IS, Mobilize.  -- Follow up with Dr. Aundria Rud in 4-6 weeks for hospital follow up of pleural effusion.   Pain control -- Per primary team  -- Per patient, pain control improving Best Practice (right click and "Reselect all SmartList Selections" daily)   Per primary team    Earney [place], ACNP Pulmonary/Critical Care Medicine  09/06/2022  8:13 AM   Labs   CBC: Recent Labs  Lab 08/30/22 1616 08/31/22 0255 09/01/22 0510 09/03/22 0106 09/04/22 0117 09/06/22 0158  WBC 25.5* 22.1* 24.3* 14.6* 12.1* 10.1  NEUTROABS 23.0* 19.1*  --   --   --   --   HGB 16.2 14.0 13.9 12.7* 12.1* 11.8*  HCT 47.7 41.9 41.1 35.3* 36.2* 33.9*  MCV 87.4 87.7 86.5 86.5 88.7 86.7  PLT 362 288 277 273 302 341     Basic Metabolic Panel: Recent Labs  Lab 08/30/22 1616 08/31/22 0255 09/01/22 0510 09/03/22 0106 09/06/22 0158  NA 135 135 130* 133* 137  K 4.7 3.7 4.1 4.1 3.5  CL 103 106 98 101 104  CO2 20* 21* 23 24 24   GLUCOSE  150* 116* 159* 131* 135*  BUN 13 15 15 10 8   CREATININE 0.82 0.78 0.79 0.72 0.87  CALCIUM 9.5 7.8* 8.4* 8.0* 8.0*  MG  --  1.4* 2.0  --   --   PHOS  --  2.4* 2.4*  --   --     GFR: Estimated Creatinine Clearance: 125.1 mL/min (by C-G formula based on SCr of 0.87 mg/dL). Recent Labs  Lab 08/30/22 1616 08/30/22 1829 08/31/22 0015 08/31/22 0255 09/01/22 0510 09/03/22 0106 09/04/22 0117 09/06/22 0158  PROCALCITON  --   --   --  2.15  --   --   --   --   WBC 25.5*  --   --  22.1* 24.3* 14.6* 12.1* 10.1  LATICACIDVEN 2.0* 2.2* 1.5 1.1   --   --   --   --      Liver Function Tests: Recent Labs  Lab 08/30/22 1616 08/31/22 0015 08/31/22 0255 09/03/22 0106  AST 38  --  21 38  ALT 82*  --  46* 49*  ALKPHOS 104  --  70 80  BILITOT 1.9*  --  2.0* 0.4  PROT 8.6*  --  6.1* 5.4*  ALBUMIN 4.3 3.5 3.1* 2.1*    No results for input(s): "LIPASE", "AMYLASE" in the last 168 hours. No results for input(s): "AMMONIA" in the last 168 hours.  ABG    Component Value Date/Time   PHART 7.43 08/30/2022 2131   PCO2ART 36 08/30/2022 2131   PO2ART 69 (L) 08/30/2022 2131   HCO3 23.9 08/30/2022 2131   ACIDBASEDEF 0.1 08/30/2022 2131   O2SAT 97.3 08/30/2022 2131     Coagulation Profile: No results for input(s): "INR", "PROTIME" in the last 168 hours.  Cardiac Enzymes: No results for input(s): "CKTOTAL", "CKMB", "CKMBINDEX", "TROPONINI" in the last 168 hours.  HbA1C: Hgb A1c MFr Bld  Date/Time Value Ref Range Status  08/31/2022 02:55 AM 4.7 (L) 4.8 - 5.6 % Final    Comment:    (NOTE) Pre diabetes:          5.7%-6.4%  Diabetes:              >6.4%  Glycemic control for   <7.0% adults with diabetes     CBG: Recent Labs  Lab 09/02/22 0346 09/02/22 0731 09/02/22 1117 09/02/22 1526 09/02/22 1910  GLUCAP 150* 97 139* 151* 149*

## 2022-09-06 NOTE — Progress Notes (Signed)
Chest tube discontinued without difficulty. Pt tolerated well, denies SOB.  Vital signs.

## 2022-09-07 ENCOUNTER — Inpatient Hospital Stay (HOSPITAL_COMMUNITY): Payer: BLUE CROSS/BLUE SHIELD

## 2022-09-07 ENCOUNTER — Other Ambulatory Visit (HOSPITAL_COMMUNITY): Payer: Self-pay

## 2022-09-07 DIAGNOSIS — D649 Anemia, unspecified: Secondary | ICD-10-CM

## 2022-09-07 DIAGNOSIS — R7989 Other specified abnormal findings of blood chemistry: Secondary | ICD-10-CM

## 2022-09-07 DIAGNOSIS — R739 Hyperglycemia, unspecified: Secondary | ICD-10-CM

## 2022-09-07 HISTORY — DX: Anemia, unspecified: D64.9

## 2022-09-07 LAB — CBC WITH DIFFERENTIAL/PLATELET
Abs Immature Granulocytes: 0.22 10*3/uL — ABNORMAL HIGH (ref 0.00–0.07)
Basophils Absolute: 0.1 10*3/uL (ref 0.0–0.1)
Basophils Relative: 1 %
Eosinophils Absolute: 0.6 10*3/uL — ABNORMAL HIGH (ref 0.0–0.5)
Eosinophils Relative: 5 %
HCT: 36.8 % — ABNORMAL LOW (ref 39.0–52.0)
Hemoglobin: 12.5 g/dL — ABNORMAL LOW (ref 13.0–17.0)
Immature Granulocytes: 2 %
Lymphocytes Relative: 19 %
Lymphs Abs: 2.1 10*3/uL (ref 0.7–4.0)
MCH: 29.8 pg (ref 26.0–34.0)
MCHC: 34 g/dL (ref 30.0–36.0)
MCV: 87.8 fL (ref 80.0–100.0)
Monocytes Absolute: 0.8 10*3/uL (ref 0.1–1.0)
Monocytes Relative: 7 %
Neutro Abs: 7.4 10*3/uL (ref 1.7–7.7)
Neutrophils Relative %: 66 %
Platelets: 378 10*3/uL (ref 150–400)
RBC: 4.19 MIL/uL — ABNORMAL LOW (ref 4.22–5.81)
RDW: 12.2 % (ref 11.5–15.5)
WBC: 11.2 10*3/uL — ABNORMAL HIGH (ref 4.0–10.5)
nRBC: 0 % (ref 0.0–0.2)

## 2022-09-07 LAB — COMPREHENSIVE METABOLIC PANEL
ALT: 39 U/L (ref 0–44)
AST: 28 U/L (ref 15–41)
Albumin: 2.3 g/dL — ABNORMAL LOW (ref 3.5–5.0)
Alkaline Phosphatase: 69 U/L (ref 38–126)
Anion gap: 6 (ref 5–15)
BUN: 5 mg/dL — ABNORMAL LOW (ref 6–20)
CO2: 24 mmol/L (ref 22–32)
Calcium: 8.5 mg/dL — ABNORMAL LOW (ref 8.9–10.3)
Chloride: 110 mmol/L (ref 98–111)
Creatinine, Ser: 0.86 mg/dL (ref 0.61–1.24)
GFR, Estimated: >60 mL/min (ref >60)
Glucose, Bld: 113 mg/dL — ABNORMAL HIGH (ref 70–99)
Potassium: 4 mmol/L (ref 3.5–5.1)
Sodium: 140 mmol/L (ref 135–145)
Total Bilirubin: 0.6 mg/dL (ref 0.3–1.2)
Total Protein: 5.2 g/dL — ABNORMAL LOW (ref 6.5–8.1)

## 2022-09-07 LAB — PHOSPHORUS: Phosphorus: 4 mg/dL (ref 2.5–4.6)

## 2022-09-07 LAB — MAGNESIUM: Magnesium: 2 mg/dL (ref 1.7–2.4)

## 2022-09-07 MED ORDER — SENNOSIDES-DOCUSATE SODIUM 8.6-50 MG PO TABS
1.0000 | ORAL_TABLET | Freq: Every evening | ORAL | 0 refills | Status: DC | PRN
  Filled 2022-09-07: qty 30, 30d supply, fill #0

## 2022-09-07 MED ORDER — ACETAMINOPHEN 325 MG PO TABS
650.0000 mg | ORAL_TABLET | Freq: Four times a day (QID) | ORAL | 0 refills | Status: DC | PRN
  Filled 2022-09-07: qty 20, 3d supply, fill #0

## 2022-09-07 MED ORDER — ALBUTEROL SULFATE HFA 108 (90 BASE) MCG/ACT IN AERS
2.0000 | INHALATION_SPRAY | Freq: Four times a day (QID) | RESPIRATORY_TRACT | 2 refills | Status: DC | PRN
  Filled 2022-09-07: qty 6.7, 24d supply, fill #0

## 2022-09-07 MED ORDER — HYDROXYZINE HCL 10 MG PO TABS
10.0000 mg | ORAL_TABLET | Freq: Three times a day (TID) | ORAL | 0 refills | Status: DC | PRN
  Filled 2022-09-07: qty 30, 10d supply, fill #0

## 2022-09-07 MED ORDER — GUAIFENESIN ER 600 MG PO TB12
600.0000 mg | ORAL_TABLET | Freq: Two times a day (BID) | ORAL | 0 refills | Status: AC
  Filled 2022-09-07: qty 10, 5d supply, fill #0

## 2022-09-07 MED ORDER — AMOXICILLIN-POT CLAVULANATE 875-125 MG PO TABS
1.0000 | ORAL_TABLET | Freq: Two times a day (BID) | ORAL | 0 refills | Status: AC
  Filled 2022-09-07: qty 28, 14d supply, fill #0

## 2022-09-07 MED ORDER — OXYCODONE HCL 5 MG PO TABS
5.0000 mg | ORAL_TABLET | Freq: Four times a day (QID) | ORAL | 0 refills | Status: DC | PRN
  Filled 2022-09-07: qty 10, 3d supply, fill #0

## 2022-09-07 MED ORDER — NICOTINE 21 MG/24HR TD PT24
21.0000 mg | MEDICATED_PATCH | Freq: Every day | TRANSDERMAL | 0 refills | Status: DC
  Filled 2022-09-07: qty 28, 28d supply, fill #0

## 2022-09-07 NOTE — Progress Notes (Signed)
RT instructed patient on the use of a flutter valve.

## 2022-09-07 NOTE — Discharge Summary (Signed)
Physician Discharge Summary   Patient: Carl Le MRN: [id number] DOB: October 29, 1981  Admit date:     09/02/2022  Discharge date: 09/07/22  Discharge Physician: Raiford Noble, DO   PCP: Pcp, No   Recommendations at discharge:   Follow-up and establish with PCP within 1 to 2 weeks and repeat CBC, CMP, mag, Phos within 1 week Follow-up with pulmonary in the outpatient setting within 1 to 2 weeks and repeat chest x-ray in 1 to 2 weeks  Discharge Diagnoses: Principal Problem:   Community acquired pneumonia of right lower lobe of lung Active Problems:   Parapneumonic effusion   Acute respiratory failure with hypoxia (HCC)   Sinus tachycardia   LFT elevation   Hyperglycemia   Hyponatremia   Tobacco use   Normocytic anemia  Resolved Problems:   * No resolved hospital problems. *  Hospital Course: Carl Le is a 41 y.o. male with medical history significant for tobacco use who is directly admitted from [facility] with right lower lobe pneumonia complicated by parapneumonic effusion s/p chest tube placement on 10/25 and daily fibrinolysis 10/26 >> 10/28 and transferred for cardiothoracic surgery evaluation.  Current hospital course showed: Admitted to [facility] 10/25 with spasm chest pain after being on Augmentin/doxycycline since 10/22 as OP diagnosis [facility] ED White count 25 10/25-POCUS = right-sided pleural effusion underlying collapse consolidation 10/26 tPA DNase-discussion with CT surgery for transfer 10/28 pleural fibrinolysis again performed transferred to [facility]   CT Surgery evaluated and transitioned the patient to waterseal and now Chest Tube is removed.  She did well and pain improved.  He is deemed stable for discharge and did not desaturate on ambulatory home O2 screen.  He will be continued on Augmentin for 2 weeks per pulmonary recommendations and will need to follow-up with PCP and pulmonary in outpatient setting  Assessment and Plan: Community acquired pneumonia of  right lower lobe of lung Complicated right-sided parapneumonic effusion -CT scan 10/27 collapsed R mid, lower lobes, loculated pleural effusion decreased compared to prior study 10/25  -Continue empiric therapies Zosyn Q8--blood culture, thoracentesis cultures 10/26 NGTD, BC x 2 10/25 NGTD -White count has improved from 20 range and is now normalized at 10.1 yesterday and slightly bumped 11.2 but he is stable -CVTS provided opinion about findings and feel can transition to waterseal-and no need for any surgery-defer to them for further management; chest tube was removed today -PCA fentanyl was initiated but will need to transition and start weaning off of narcotics -Cont oxycodone 5-10 every 6 as needed for breakthrough in addition to Robaxin 500 every 8 we will need to start weaning and repeat chest x-ray in the outpatient setting given that he is cleared by pulmonary -Added Valium given his anxiety will not continue   Acute respiratory failure with hypoxia ([place]) -Secondary to parapneumonic effusion.  Stable on 3-4 L O2 via [place]. -SpO2: 97 % O2 Flow Rate (L/min): 96 L/min FiO2 (%): 35 % pulm patient was weaned off of all oxygen and did not desaturate -Continuous pulse oximetry maintain O2 saturation greater than 90% -Continue supplemental oxygen via nasal cannula and wean O2 as tolerating -the patient will need an ambulatory home O2 screen and did not desaturate and repeat chest x-ray within 1 to 2 weeks.   Sinus Tachycardia -Secondary to uncontrolled pain and anxiety and this is improved greatly.  He was on PCA, fentanyl, Dilaudid, oxycodone prn, Valium at previous hospital.  - -He is asking for all of these on arrival. -Continue  fentanyl PCA and wean and now off of the PCA -Tylenol and oxycodone as needed and pain is well controlled -Atarax as needed for anxiety   Tobacco use -Reports smoking 1 pack/day for many years.  Continue nicotine patch. -Smoking cessation counseling given    Hyponatremia -Sodium has improved to 137 yesterday and today is 140 -Continue monitor and trend and repeat CMP in a.m.   LFT elevation -CT normal -Check LFTs in the morning and have now normalized -Likely secondary to infection   Hyperglycemia  -Likely reactive -A1c 4.7 no SSI at this time   Normocytic Anemia -Possibly dilutional drop but will need to continue monitor carefully -Patient's hemoglobin/hematocrit went from 13.9/41.1 -> 12.7/35.3 -> 12.1/36.2 -> 11.8/33.9 -Check Anemia Panel in the outpatient setting -Continue to Monitor for S/Sx of Bleeding; No overt bleeding noted -Repeat CBC in the AM    Pain control - [facility] Controlled Substance Reporting System database was reviewed. and patient was instructed, not to drive, operate heavy machinery, perform activities at heights, swimming or participation in water activities or provide baby-sitting services while on Pain, Sleep and Anxiety Medications; until their outpatient Physician has advised to do so again. Also recommended to not to take more than prescribed Pain, Sleep and Anxiety Medications.   Consultants: Pulmonary, CT surgery Procedures performed: See above Disposition: Home Diet recommendation:  Discharge Diet Orders (From admission, onward)     Start     Ordered   09/07/22 0000  Diet general        09/07/22 1226           Regular diet DISCHARGE MEDICATION: Allergies as of 09/07/2022   No Known Allergies      Medication List     STOP taking these medications    doxycycline 100 MG capsule Commonly known as: VIBRAMYCIN   piperacillin-tazobactam 3.375 GM/50ML IVPB Commonly known as: ZOSYN       TAKE these medications    acetaminophen 325 MG tablet Commonly known as: TYLENOL Take 2 tablets (650 mg total) by mouth every 6 (six) hours as needed for mild pain (or Fever >/= 101). What changed:  medication strength how much to take when to take this reasons to take this   albuterol  108 (90 Base) MCG/ACT inhaler Commonly known as: VENTOLIN HFA Inhale 2 puffs into the lungs every 6 (six) hours as needed for wheezing or shortness of breath.   amoxicillin-clavulanate 875-125 MG tablet Commonly known as: AUGMENTIN Take 1 tablet by mouth 2 (two) times daily for 14 days.   HYDROcodone bit-homatropine 5-1.5 MG/5ML syrup Commonly known as: HYCODAN Take 5 mLs by mouth every 6 (six) hours as needed for cough.   hydrOXYzine 10 MG tablet Commonly known as: ATARAX Take 1 tablet (10 mg total) by mouth 3 (three) times daily as needed for anxiety.   ibuprofen 200 MG tablet Commonly known as: ADVIL Take 600-800 mg by mouth 2 (two) times daily as needed (pain).   Mucus Relief 600 MG 12 hr tablet Generic drug: guaiFENesin Take 1 tablet (600 mg total) by mouth 2 (two) times daily for 5 days.   nicotine 21 mg/24hr patch Commonly known as: NICODERM CQ - dosed in mg/24 hours Place 1 patch (21 mg total) onto the skin daily. Start taking on: September 08, 2022   oxyCODONE 5 MG immediate release tablet Commonly known as: Oxy IR/ROXICODONE Take 1 tablet (5 mg total) by mouth every 6 (six) hours as needed for breakthrough pain or moderate pain.  oxymetazoline 0.05 % nasal spray Commonly known as: AFRIN Place 1 spray into both nostrils daily as needed for congestion.   Senexon-S 8.6-50 MG tablet Generic drug: senna-docusate Take 1 tablet by mouth at bedtime as needed for mild constipation.        Follow-up Information     [facility] Follow up.   Why: To obtain a Primary Care Physician you can call the toll free number on your insurance card and request a providers list for your area. You may also visit the insurance provider online resource.  Or you can contact [facility] for assistance.  [phone] (for physician referral list assistance) or Call our [place] physician referral line at [phone] Contact information: Royann Shivers-  [phone]               Discharge Exam: Filed Weights   09/03/22 0438  Weight: 80.1 kg   Vitals:   09/07/22 0911 09/07/22 1311  BP: 119/83 132/71  Pulse: 96 99  Resp: 20 17  Temp: 98.1 F (36.7 C) 98.1 F (36.7 C)  SpO2: 94% 97%   Examination: Physical Exam:  Constitutional: WN/WD Caucasian male no acute distress Respiratory: Diminished to auscultation bilaterally, no wheezing, rales, rhonchi or crackles. Normal respiratory effort and patient is not tachypenic. No accessory muscle use.  Unlabored breathing Cardiovascular: RRR, no murmurs / rubs / gallops. S1 and S2 auscultated. No extremity edema.  Abdomen: Soft, non-tender, non-distended. Bowel sounds positive.  GU: Deferred. Musculoskeletal: No clubbing / cyanosis of digits/nails. No joint deformity upper and lower extremities.  Skin: No rashes, lesions, ulcers limited skin evaluation. No induration; Warm and dry.  Neurologic: CN 2-12 grossly intact with no focal deficits.  Romberg sign cerebellar reflexes not assessed.  Psychiatric: Normal judgment and insight. Alert and oriented x 3. Normal mood and appropriate affect.    Condition at discharge: stable  The results of significant diagnostics from this hospitalization (including imaging, microbiology, ancillary and laboratory) are listed below for reference.   Imaging Studies: DG CHEST PORT 1 VIEW  Result Date: 09/07/2022 CLINICAL DATA:  41 year old male presents following chest tube removal. EXAM: PORTABLE CHEST 1 VIEW COMPARISON:  September 03, 2022 and CT of the chest September 04, 2022. FINDINGS: Trachea midline. Cardiomediastinal contours and hilar structures are stable. Heart size is normal. No pneumothorax. Patchy opacities in the LEFT upper lobe persist. The fissural fluid and vague areas of graded opacity in the RIGHT chest in the lower chest also persist. Chest tube has been removed from the RIGHT chest since previous imaging. EKG leads project over the  chest. On limited assessment there is no acute skeletal process. IMPRESSION: No pneumothorax following chest tube removal. Graded opacity in the RIGHT lower chest compatible with residual loculated pleural fluid better seen on recent chest CT. Patchy opacities throughout the chest particularly in LEFT upper lobe may reflect residual mild pneumonitis. Electronically Signed   By: Zetta Bills M.D.   On: 09/07/2022 08:14   CT CHEST W CONTRAST  Result Date: 09/04/2022 CLINICAL DATA:  Sepsis.  Parapneumonic effusion. EXAM: CT CHEST WITH CONTRAST TECHNIQUE: Multidetector CT imaging of the chest was performed during intravenous contrast administration. RADIATION DOSE REDUCTION: This exam was performed according to the departmental dose-optimization program which includes automated exposure control, adjustment of the mA and/or kV according to patient size and/or use of iterative reconstruction technique. CONTRAST:  66mL OMNIPAQUE IOHEXOL 350 MG/ML SOLN COMPARISON:  September 01, 2022. FINDINGS: Cardiovascular: No significant vascular findings. Normal heart  size. No pericardial effusion. Mediastinum/Nodes: No enlarged mediastinal, hilar, or axillary lymph nodes. Thyroid gland, trachea, and esophagus demonstrate no significant findings. Lungs/Pleura: No pneumothorax is noted. There is interval development of multiple patchy airspace opacities in left upper lobe concerning for multifocal pneumonia. Continued presence of pigtail drainage catheter in the right lung base. Grossly stable appearance loculated right pleural effusion with associated right lower lobe pneumonia and probable right middle lobe pneumonia. Mild left basilar subsegmental atelectasis is noted. Upper Abdomen: No acute abnormality. Musculoskeletal: No chest wall abnormality. No acute or significant osseous findings. IMPRESSION: Stable position of pigtail drainage catheter in right lung base, with grossly stable appearance of loculated right pleural  effusion and associated right lower lobe and right middle lobe pneumonia. Mild left basilar subsegmental atelectasis is noted. However, there is interval development of multiple patchy airspace opacities in the left upper lobe concerning for multifocal pneumonia. Electronically Signed   By: Marijo Conception M.D.   On: 09/04/2022 16:16   DG CHEST PORT 1 VIEW  Result Date: 09/03/2022 CLINICAL DATA:  Parapneumonic effusion EXAM: PORTABLE CHEST 1 VIEW COMPARISON:  Prior chest x-ray yesterday FINDINGS: Right-sided chest tube remains in place. Slightly decreased right basilar airspace opacity. Persistent streaky opacities in the left lung base. Stable cardiac and mediastinal contours. No pneumothorax. No acute osseous abnormality. IMPRESSION: 1. Stable position of right-sided chest tube. 2. Improving right basilar airspace opacities. 3. Persistent left basilar atelectasis. Electronically Signed   By: Jacqulynn Cadet M.D.   On: 09/03/2022 07:58   DG Chest Port 1 View  Result Date: 09/02/2022 CLINICAL DATA:  Pleural effusion. EXAM: PORTABLE CHEST 1 VIEW COMPARISON:  09/01/2022 FINDINGS: There is a right pigtail thoracostomy tube overlying the right mid lung. This is unchanged in position from previous exam. No pneumothorax identified. Unchanged appearance of moderate to large right pleural effusion. Similar appearance of small left pleural effusion with overlying atelectasis. IMPRESSION: 1. Stable position of right pigtail thoracostomy tube. No pneumothorax. 2. Unchanged moderate to large right pleural effusion. Electronically Signed   By: Kerby Moors M.D.   On: 09/02/2022 08:15   CT Angio Chest Pulmonary Embolism (PE) W or WO Contrast  Result Date: 09/01/2022 CLINICAL DATA:  Pneumonia follow-up status post chest tube for parapneumonic effusion. Shortness of breath. EXAM: CT ANGIOGRAPHY CHEST WITH CONTRAST TECHNIQUE: Multidetector CT imaging of the chest was performed using the standard protocol during  bolus administration of intravenous contrast. Multiplanar CT image reconstructions and MIPs were obtained to evaluate the vascular anatomy. RADIATION DOSE REDUCTION: This exam was performed according to the departmental dose-optimization program which includes automated exposure control, adjustment of the mA and/or kV according to patient size and/or use of iterative reconstruction technique. CONTRAST:  84mL OMNIPAQUE IOHEXOL 350 MG/ML SOLN COMPARISON:  CT chest dated August 30, 2022. FINDINGS: Cardiovascular: Satisfactory opacification of the pulmonary arteries to the segmental level. No evidence of pulmonary embolism. Normal heart size. No pericardial effusion. No thoracic aortic aneurysm or dissection. Mediastinum/Nodes: No pathologically enlarged mediastinal, hilar, or axillary lymph nodes. Prominent subcentimeter mediastinal lymph nodes again noted, likely reactive. Thyroid gland, trachea, and esophagus demonstrate no significant findings. Lungs/Pleura: New posterior approach pigtail chest tube at the right lung base. Overall amount of loculated pleural fluid has decreased compared to the prior study 2 days ago, although there is more fluid loculated anteriorly around the right middle lobe compared to the prior. There is a new small pneumothorax component posteriorly as well. No pleural enhancement. The right middle and lower  lobes are now completely collapsed with debris and obstruction of the central right middle and lower lobe bronchi. Additional new areas of hypoenhancement centrally with air bronchograms in the right middle and lower lobes. New atelectasis in the anterior inferior right upper lobe. Unchanged mild atelectasis in the left lower lobe. Unchanged mild centrilobular and paraseptal emphysema. Upper Abdomen: No acute abnormality. Musculoskeletal: No chest wall abnormality. Unchanged elevated right hemidiaphragm. No acute or significant osseous findings. Review of the MIP images confirms the above  findings. IMPRESSION: 1. No evidence of pulmonary embolism. 2. New posterior approach pigtail chest tube at the right lung base with new small hydropneumothorax. Overall amount of loculated pleural fluid has decreased compared to the prior study 2 days ago. No pleural enhancement. 3. New complete collapse of the right middle and lower lobes with debris in obstruction of the central bronchi and new areas of hypoenhancement centrally in both lobes, concerning for pneumonia. 4.  Emphysema (ICD10-J43.9). Electronically Signed   By: Titus Dubin M.D.   On: 09/01/2022 10:00   DG Chest Port 1 View  Result Date: 09/01/2022 CLINICAL DATA:  41 year old male with loculated right pleural effusion, chest tube. EXAM: PORTABLE CHEST 1 VIEW COMPARISON:  Portable chest 08/31/2022 and earlier. FINDINGS: Portable AP upright view at 0550 hours. Right side pigtail pleural catheter remains in place. Opacified lower half of the right hemithorax is unchanged. A combination of pleural fluid and consolidation was demonstrated by CT 2 days ago. No pneumothorax. Mildly lower lung volumes overall, with new patchy left lung base opacity. Visible mediastinal contours are stable. Visualized tracheal air column is within normal limits. No acute osseous abnormality identified. Negative visible bowel gas. IMPRESSION: 1. Stable right pleural catheter with ongoing opacification of the lower half of the right hemithorax, possibly combined residual pleural fluid and consolidation. No pneumothorax. 2. Lower lung volumes with new patchy left lung base opacity, indeterminate for atelectasis versus bilateral infection. Electronically Signed   By: Genevie Ann M.D.   On: 09/01/2022 06:21   DG Chest Port 1 View  Result Date: 08/31/2022 CLINICAL DATA:  Chest tube in place EXAM: PORTABLE CHEST 1 VIEW COMPARISON:  None Available. FINDINGS: Normal cardiac silhouette. Elevation of the RIGHT hemidiaphragm with RIGHT basilar atelectasis and effusion. RIGHT  chest tube in place. No pneumothorax. IMPRESSION: 1. No significant change. 2. RIGHT basilar atelectasis and effusion with elevated RIGHT hemidiaphragm and chest tube in place. Electronically Signed   By: Suzy Bouchard M.D.   On: 08/31/2022 08:54   DG Chest Port 1 View  Result Date: 08/31/2022 CLINICAL DATA:  Follow-up right chest tube, initial encounter EXAM: PORTABLE CHEST 1 VIEW COMPARISON:  08/30/2022 FINDINGS: Cardiac shadow is within normal limits. Lungs are well aerated bilaterally. Bilateral consolidation is noted right greater than left. Pigtail catheter is noted on the right with significant decrease in the right-sided pleural effusion. No bony abnormality is noted. IMPRESSION: Bibasilar consolidation right greater than left. No pneumothorax following chest tube placement. Right effusion has reduced significantly. Electronically Signed   By: Inez Catalina M.D.   On: 08/31/2022 00:45   CT CHEST W CONTRAST  Result Date: 08/30/2022 CLINICAL DATA:  Pneumonia, complication suspected, xray done Concern for empyema. EXAM: CT CHEST WITH CONTRAST TECHNIQUE: Multidetector CT imaging of the chest was performed during intravenous contrast administration. RADIATION DOSE REDUCTION: This exam was performed according to the departmental dose-optimization program which includes automated exposure control, adjustment of the mA and/or kV according to patient size and/or use of iterative  reconstruction technique. CONTRAST:  8mL OMNIPAQUE IOHEXOL 300 MG/ML  SOLN COMPARISON:  Chest x-ray 08/30/2022 FINDINGS: Cardiovascular: Normal heart size. No significant pericardial effusion. The thoracic aorta is normal in caliber. No atherosclerotic plaque of the thoracic aorta. No coronary artery calcifications. Mediastinum/Nodes: No enlarged mediastinal, hilar, or axillary lymph nodes. Thyroid gland, trachea, and esophagus demonstrate no significant findings. Lungs/Pleura: Mild centrilobular emphysematous changes. Trace  biapical pleural/pulmonary scarring. Passive atelectasis of the right lower and middle lobe. Elevated right hemidiaphragm. Elevated left hemidiaphragm with left basilar atelectasis. No focal consolidation. No pulmonary nodule. No pulmonary mass. Loculated trace to small volume right pleural effusion. No pleural thickening or enhancement to suggest empyema. No pneumothorax. Upper Abdomen:  No acute abnormality. Musculoskeletal: No chest wall abnormality. No suspicious lytic or blastic osseous lesions. No acute displaced fracture. IMPRESSION: Loculated trace to small volume right pleural effusion. No pleural thickening or enhancement to suggest empyema. Associated passive atelectasis of the right lower and middle lobe Electronically Signed   By: Iven Finn M.D.   On: 08/30/2022 22:20   DG Chest Portable 1 View  Result Date: 08/30/2022 CLINICAL DATA:  Chest pain due to spasms and worsening pneumonia, on antibiotics since Sunday with no relief, pain rated at 10/10 EXAM: PORTABLE CHEST 1 VIEW COMPARISON:  05/11/2014 FINDINGS: Normal heart size and mediastinal contours. Decreased lung volumes versus previous exam. RIGHT basilar infiltrate and associated pleural effusion. Remaining lungs clear. No pneumothorax or acute osseous findings. IMPRESSION: RIGHT basilar infiltrate consistent with pneumonia. Associated RIGHT parapneumonic pleural effusion. Electronically Signed   By: Lavonia Dana M.D.   On: 08/30/2022 16:22    Microbiology: Results for orders placed or performed during the hospital encounter of 08/30/22  Culture, blood (routine x 2)     Status: None   Collection Time: 08/30/22  4:16 PM   Specimen: BLOOD RIGHT HAND  Result Value Ref Range Status   Specimen Description BLOOD RIGHT HAND  Final   Special Requests   Final    BOTTLES DRAWN AEROBIC AND ANAEROBIC Blood Culture adequate volume   Culture   Final    NO GROWTH 5 DAYS Performed at [facility], [address], [place],  [place] [address]    Report Status 09/04/2022 FINAL  Final  Culture, blood (routine x 2)     Status: None   Collection Time: 08/30/22  4:16 PM   Specimen: Right Antecubital; Blood  Result Value Ref Range Status   Specimen Description RIGHT ANTECUBITAL  Final   Special Requests   Final    BOTTLES DRAWN AEROBIC AND ANAEROBIC Blood Culture results may not be optimal due to an excessive volume of blood received in culture bottles   Culture   Final    NO GROWTH 5 DAYS Performed at [facility], [address] [place],  [address]    Report Status 09/04/2022 FINAL  Final  SARS Coronavirus 2 by RT PCR (hospital order, performed in [facility] hospital lab) *cepheid single result test* Anterior Nasal Swab     Status: None   Collection Time: 08/30/22  4:55 PM   Specimen: Anterior Nasal Swab  Result Value Ref Range Status   SARS Coronavirus 2 by RT PCR NEGATIVE NEGATIVE Final    Comment: (NOTE) SARS-CoV-2 target nucleic acids are NOT DETECTED.  The SARS-CoV-2 RNA is generally detectable in upper and lower respiratory specimens during the acute phase of infection. The lowest concentration of SARS-CoV-2 viral copies this assay can detect is 250 copies / mL.  A negative result does not preclude SARS-CoV-2 infection and should not be used as the sole basis for treatment or other patient management decisions.  A negative result may occur with improper specimen collection / handling, submission of specimen other than nasopharyngeal swab, presence of viral mutation(s) within the areas targeted by this assay, and inadequate number of viral copies (<250 copies / mL). A negative result must be combined with clinical observations, patient history, and epidemiological information.  Fact Sheet for Patients:   https://www.patel.info/  Fact Sheet for Healthcare Providers: https://hall.com/  This test is not yet approved or  cleared by the [place]  FDA and has been authorized for detection and/or diagnosis of SARS-CoV-2 by FDA under an Emergency Use Authorization (EUA).  This EUA will remain in effect (meaning this test can be used) for the duration of the COVID-19 declaration under Section 564(b)(1) of the Act, 21 U.S.C. section 360bbb-3(b)(1), unless the authorization is terminated or revoked sooner.  Performed at [facility], [address] [place], [place] [address]   MRSA Next Gen by PCR, Nasal     Status: None   Collection Time: 08/31/22 12:15 AM   Specimen: Nasal Mucosa; Nasal Swab  Result Value Ref Range Status   MRSA by PCR Next Gen NOT DETECTED NOT DETECTED Final    Comment: (NOTE) The GeneXpert MRSA Assay (FDA approved for NASAL specimens only), is one component of a comprehensive MRSA colonization surveillance program. It is not intended to diagnose MRSA infection nor to guide or monitor treatment for MRSA infections. Test performance is not FDA approved in patients less than 50 years old. Performed at [facility], [address] [place], [place] [address]   Pleural fluid culture w Gram Stain     Status: None   Collection Time: 08/31/22 12:35 AM   Specimen: Pleural Fluid  Result Value Ref Range Status   Specimen Description   Final    PLEURAL Performed at [facility], [address] [place], [place] [address]    Special Requests   Final    NONE Performed at [facility], [address] [place], [place] [address]    Gram Stain   Final    MODERATE WBC PRESENT, PREDOMINANTLY PMN NO ORGANISMS SEEN    Culture   Final    NO GROWTH 3 DAYS Performed at [facility], [place] [address], [place],  [address]    Report Status 09/03/2022 FINAL  Final   Labs: CBC: Recent Labs  Lab 09/01/22 0510 09/03/22 0106 09/04/22 0117 09/06/22 0158 09/07/22 0112  WBC 24.3* 14.6* 12.1* 10.1 11.2*  NEUTROABS  --   --   --   --  7.4  HGB 13.9 12.7* 12.1* 11.8* 12.5*   HCT 41.1 35.3* 36.2* 33.9* 36.8*  MCV 86.5 86.5 88.7 86.7 87.8  PLT 277 273 302 341 937   Basic Metabolic Panel: Recent Labs  Lab 09/01/22 0510 09/03/22 0106 09/06/22 0158 09/07/22 0112  NA 130* 133* 137 140  K 4.1 4.1 3.5 4.0  CL 98 101 104 110  CO2 23 24 24 24   GLUCOSE 159* 131* 135* 113*  BUN 15 10 8  5*  CREATININE 0.79 0.72 0.87 0.86  CALCIUM 8.4* 8.0* 8.0* 8.5*  MG 2.0  --   --  2.0  PHOS 2.4*  --   --  4.0   Liver Function Tests: Recent Labs  Lab 09/03/22 0106 09/07/22 0112  AST 38 28  ALT 49* 39  ALKPHOS 80 69  BILITOT 0.4 0.6  PROT 5.4* 5.2*  ALBUMIN 2.1* 2.3*   CBG: Recent Labs  Lab 09/02/22 0346 09/02/22 0731 09/02/22 1117 09/02/22 1526 09/02/22 1910  GLUCAP 150* 97 139* 151* 149*   Discharge time spent: greater than 30 minutes.  Signed: Raiford Noble, DO [facility] 09/07/2022

## 2022-09-07 NOTE — TOC Transition Note (Addendum)
Transition of Care (TOC) - CM/SW Discharge Note Marvetta Gibbons RN, BSN Transitions of Care Unit 4E- RN Case Manager See Treatment Team for direct phone #   Patient Details  Name: Carl Le MRN: [id number] Date of Birth: Nov 03, 1981  Transition of Care [facility]) CM/SW Contact:  Dawayne Patricia, RN Phone Number: 09/07/2022, 2:19 PM   Clinical Narrative:    Pt stable for transition home today, No TOC needs noted. Pt has transportation home.  1510- noted referral for PCP needs- pt provided info    Final next level of care: Home/Self Care Barriers to Discharge: No Barriers Identified   Patient Goals and CMS Choice    N/A    Discharge Placement               Home        Discharge Plan and Services                     PCP needs referral- Info for [facility] provided for pt to follow up on primary care needs                Social Determinants of Health (SDOH) Interventions     Readmission Risk Interventions    09/07/2022    2:19 PM  Readmission Risk Prevention Plan  Post Dischage Appt Complete  Medication Screening Complete  Transportation Screening Complete

## 2022-09-07 NOTE — Progress Notes (Signed)
09/07/2022 Discharge AVS meds taken today and those due this evening reviewed.  Follow-up appointments and when to call md reviewed.  D/C IV and TELE.  Questions and concerns addressed.   D/C home per orders.  Carney Corners

## 2022-09-07 NOTE — Progress Notes (Signed)
09/07/2022 11:51 AM Pt ambulated 940 ft on RA without any assistance and did not have to stop for a break.  Sats were 96-98%.   Carney Corners

## 2022-09-08 MED FILL — Hydromorphone HCl Preservative Free (PF) Inj 1 MG/ML: INTRAMUSCULAR | Qty: 1 | Status: AC

## 2022-09-12 LAB — CHOLESTEROL, BODY FLUID: Cholesterol, Fluid: 93 mg/dL

## 2022-09-21 ENCOUNTER — Encounter: Payer: Self-pay | Admitting: Internal Medicine

## 2022-09-21 ENCOUNTER — Ambulatory Visit: Payer: BLUE CROSS/BLUE SHIELD | Admitting: Internal Medicine

## 2022-09-21 VITALS — BP 124/79 | HR 79 | Temp 97.9°F | Resp 14 | Ht 72.0 in | Wt 166.2 lb

## 2022-09-21 DIAGNOSIS — R739 Hyperglycemia, unspecified: Secondary | ICD-10-CM

## 2022-09-21 DIAGNOSIS — F419 Anxiety disorder, unspecified: Secondary | ICD-10-CM

## 2022-09-21 DIAGNOSIS — D649 Anemia, unspecified: Secondary | ICD-10-CM | POA: Diagnosis not present

## 2022-09-21 DIAGNOSIS — F17210 Nicotine dependence, cigarettes, uncomplicated: Secondary | ICD-10-CM

## 2022-09-21 DIAGNOSIS — R7989 Other specified abnormal findings of blood chemistry: Secondary | ICD-10-CM | POA: Diagnosis not present

## 2022-09-21 DIAGNOSIS — Z720 Tobacco use: Secondary | ICD-10-CM | POA: Diagnosis not present

## 2022-09-21 DIAGNOSIS — F17200 Nicotine dependence, unspecified, uncomplicated: Secondary | ICD-10-CM

## 2022-09-21 DIAGNOSIS — J189 Pneumonia, unspecified organism: Secondary | ICD-10-CM

## 2022-09-21 DIAGNOSIS — R079 Chest pain, unspecified: Secondary | ICD-10-CM

## 2022-09-21 DIAGNOSIS — Z87891 Personal history of nicotine dependence: Secondary | ICD-10-CM | POA: Insufficient documentation

## 2022-09-21 DIAGNOSIS — Z0271 Encounter for disability determination: Secondary | ICD-10-CM

## 2022-09-21 DIAGNOSIS — E871 Hypo-osmolality and hyponatremia: Secondary | ICD-10-CM

## 2022-09-21 HISTORY — DX: Chest pain, unspecified: R07.9

## 2022-09-21 HISTORY — DX: Encounter for disability determination: Z02.71

## 2022-09-21 HISTORY — DX: Anxiety disorder, unspecified: F41.9

## 2022-09-21 HISTORY — DX: Nicotine dependence, unspecified, uncomplicated: F17.200

## 2022-09-21 LAB — COMPREHENSIVE METABOLIC PANEL
ALT: 19 U/L (ref 0–53)
AST: 17 U/L (ref 0–37)
Albumin: 4.4 g/dL (ref 3.5–5.2)
Alkaline Phosphatase: 93 U/L (ref 39–117)
BUN: 11 mg/dL (ref 6–23)
CO2: 31 mEq/L (ref 19–32)
Calcium: 9.5 mg/dL (ref 8.4–10.5)
Chloride: 101 mEq/L (ref 96–112)
Creatinine, Ser: 0.85 mg/dL (ref 0.40–1.50)
GFR: 109.37 mL/min (ref >60.00)
Glucose, Bld: 81 mg/dL (ref 70–99)
Potassium: 4.1 mEq/L (ref 3.5–5.1)
Sodium: 138 mEq/L (ref 135–145)
Total Bilirubin: 0.5 mg/dL (ref 0.2–1.2)
Total Protein: 7.2 g/dL (ref 6.0–8.3)

## 2022-09-21 LAB — CBC
HCT: 47.1 % (ref 39.0–52.0)
Hemoglobin: 16.3 g/dL (ref 13.0–17.0)
MCHC: 34.6 g/dL (ref 30.0–36.0)
MCV: 84.8 fl (ref 78.0–100.0)
Platelets: 322 10*3/uL (ref 150.0–400.0)
RBC: 5.56 Mil/uL (ref 4.22–5.81)
RDW: 13.3 % (ref 11.5–15.5)
WBC: 8.4 10*3/uL (ref 4.0–10.5)

## 2022-09-21 LAB — PHOSPHORUS: Phosphorus: 3.3 mg/dL (ref 2.3–4.6)

## 2022-09-21 LAB — MAGNESIUM: Magnesium: 2 mg/dL (ref 1.5–2.5)

## 2022-09-21 LAB — TSH: TSH: 1.94 u[IU]/mL (ref 0.35–5.50)

## 2022-09-21 LAB — LDL CHOLESTEROL, DIRECT: Direct LDL: 140 mg/dL

## 2022-09-21 MED ORDER — DEXTROMETHORPHAN-GUAIFENESIN 10-100 MG/5ML PO LIQD: 5.0000 mL | ORAL | 1 refills | Status: DC | PRN

## 2022-09-21 MED ORDER — OXYCODONE-ACETAMINOPHEN 5-325 MG PO TABS: 1.0000 | ORAL_TABLET | ORAL | 0 refills | Status: AC | PRN

## 2022-09-21 MED ORDER — NICOTINE POLACRILEX 4 MG MT GUM: 4.0000 mg | CHEWING_GUM | OROMUCOSAL | 11 refills | Status: DC | PRN

## 2022-09-21 MED ORDER — DIAZEPAM 2 MG PO TABS: 2.0000 mg | ORAL_TABLET | Freq: Four times a day (QID) | ORAL | 0 refills | Status: DC | PRN

## 2022-09-21 NOTE — Assessment & Plan Note (Signed)
Given crazy dreams with the patches we will try the gums as I think it is the nicotine stimulation overnight that is causing the dreams

## 2022-09-21 NOTE — Progress Notes (Signed)
[facility]  Phone: [phone]  New patient visit  Visit Date: 09/21/2022 Patient: Carl Le   DOB: 03-Jun-1981   41 y.o. Male  MRN: [id number]  Today's healthcare provider: Lula Olszewski, MD  Assessment and Plan:   Extensive problem list update today Pain and anxiety medications high risk were continued due to severity of illness and pain, brief extension only He is not recovering well, short of breath with short walks since hospitalized with acute respiratory failure from severe pneumonia requiring chest tube-ordered cxr for him to review with pulmonary in 2 weeks, offered antibiotics but he doesn't have fevers he will hold off Ordered blood work to ensure everything is continuing to normalize but that has been improving I encouraged him to stay off work and heal/recover more but did approve him to return to work with extensive restrictions if they can accommodate them. Doubt that will be possible so also wrote note in support of awarding short term disability.  Takai was seen today for establish care, medication management and medication refill.  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 Has short term disability paperwork being done by another doc     Hyponatremia  Tobacco use  Hyperglycemia Overview: Lab Results  Component Value Date   HGBA1C 4.7 (L) 08/31/2022      Normocytic anemia -     CBC; Future  LFT elevation Overview: Lab Results  Component Value Date   ALT 39 09/07/2022   AST 28 09/07/2022   ALKPHOS 69 09/07/2022   BILITOT 0.6 09/07/2022     Orders: -     Comprehensive metabolic panel; Future -     TSH -     Lipid panel; Future -     Hepatitis C antibody  Hypophosphatemia -     Magnesium -     Phosphorus  Community acquired pneumonia of right lower lobe of lung Overview: Discharged 09/07/22 Complicated by chest tube, o2 by Section resp failure Source never  found Persistent shortness of breath coughing and right-sided chest pain November 16 2 weeks after being discharged Some things had improved some things had worsened  Assessment & Plan: Seems stable with his breathing and a rested upright position but has limited ability for movement without getting short of breath I assisted him with completing paperwork for fitness for duty in accordance with his wishes -it is unlikely that his employer will be able to work with the restrictions that I believe are most medically appropriate and so I am also writing a note that he should be approved for short-term disability until he can recover more and I will see him back in 1 month to reassess his degree of recovery. Based on my discussion and exam with him today I believe him to have made some but very minimal recovery thus far from the severe life-threatening lung infection that he had just 2 weeks ago. I assisted with getting an x-ray for him to review with pulmonology when he sees them in 2 more weeks I offered additional antibiotics if he is interested  Orders: -     DG Chest 2 View; Future -     Dextromethorphan-guaiFENesin; Take 5 mLs by mouth every 4 (four) hours as needed for cough.  Dispense: 200 mL; Refill: 1  Anxiety disorder, unspecified type Overview: Hydroxyzine failing Valium helped in hospital Being sure of breath has been really scary and makes him breathe harder and makes him  feel sick with concern for respiratory failure so I agreed to additional Valium but warned him about the risk of addiction Warned him of risk with opioids but he reports he combined them safely in hospital.  Orders: -     diazePAM; Take 1 tablet (2 mg total) by mouth every 6 (six) hours as needed for anxiety (do not mix with oxycodone).  Dispense: 30 tablet; Refill: 0  Chest pain, unspecified type Overview: Severe associated with chest tube pneumonia with parapneumonic effusion.  Hurts to breath and hurts to  cough. Pain severe- persistent.  Assessment & Plan: Agreed to continuation oxycodone Declined to refill hycodan  used a decongestant tussin DM instead Expressed concern about combining oxycodone and valium.   Orders: -     oxyCODONE-Acetaminophen; Take 1 tablet by mouth every 4 (four) hours as needed for up to 5 days for severe pain (do not mix with valium alcohol or other sedative).  Dispense: 20 tablet; Refill: 0  Tobacco dependence Assessment & Plan: Given crazy dreams with the patches we will try the gums as I think it is the nicotine stimulation overnight that is causing the dreams  Orders: -     Nicotine Polacrilex; Take 1 each (4 mg total) by mouth as needed for smoking cessation.  Dispense: 100 each; Refill: 11     Subjective:  Patient presents today to establish care.  Chief Complaint  Patient presents with   Establish Care    Wants to be cleared to go back to work. Still recovering and has pain on right side of chest with every breath from the recent pneumonia.    Medication Management    Wants to try another anxiety medication. Needs medication to help break up mucus. Requests more oxycodone.   Medication Refill    Refill of hydrocodone syrup (Hycodan), had pneumonia (10/25).     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   Chest Pain   Severe associated with chest tube pneumonia with parapneumonic effusion.  Hurts to breath  and hurts to cough. Pain severe- persistent.   Tobacco Dependence  Community Acquired Pneumonia of Right Lower Lobe of Lung   Discharged 09/07/22 Complicated by chest tube, o2 by  resp failure Source never found Persistent shortness of breath coughing and right-sided chest pain November 16 2 weeks after being discharged Some things had improved some things had worsened   Hyponatremia (Resolved)  Acute Respiratory Failure With Hypoxia (Hcc) (Resolved)  Sinus Tachycardia (Resolved)  Lft Elevation (Resolved)   Lab Results  Component Value Date   ALT 39 09/07/2022   AST 28 09/07/2022   ALKPHOS 69 09/07/2022   BILITOT 0.6 09/07/2022      Hyperglycemia (Resolved)   Lab Results  Component Value Date   HGBA1C 4.7 (L) 08/31/2022         Depression Screen    09/21/2022    1:33 PM  PHQ 2/9 Scores  PHQ - 2  Score 0   No results found for any visits on 09/21/22.   The following were reviewed and entered/updated in epic: Past Medical History:  Diagnosis Date   Anxiety disorder 09/21/2022   Hydroxyzine failing Valium helped in hospital Being sure of breath has been really scary and makes him breathe harder and makes him feel sick with concern for respiratory failure so I agreed to additional Valium but warned him about the risk of addiction   Current smoker    Herpes zoster    Kidney calculus    Mild emphysema (HCC) 08/30/2022   noted on ct scan   Tobacco dependence 09/21/2022   History reviewed. No pertinent surgical history. Family History  Problem Relation Age of Onset   Heart attack Father    Heart attack Brother    Outpatient Medications Prior to Visit  Medication Sig Dispense Refill   acetaminophen (TYLENOL) 325 MG tablet Take 2 tablets (650 mg total) by mouth every 6 (six) hours as needed for mild pain (or Fever >/= 101). 20 tablet 0   albuterol (VENTOLIN HFA) 108 (90 Base) MCG/ACT inhaler Inhale 2 puffs into the lungs every 6 (six) hours as needed for wheezing  or shortness of breath. 6.7 g 2   hydrOXYzine (ATARAX) 10 MG tablet Take 1 tablet (10 mg total) by mouth 3 (three) times daily as needed for anxiety. 30 tablet 0   ibuprofen (ADVIL) 200 MG tablet Take 600-800 mg by mouth 2 (two) times daily as needed (pain).     nicotine (NICODERM CQ - DOSED IN MG/24 HOURS) 21 mg/24hr patch Place 1 patch (21 mg total) onto the skin daily. 28 patch 0   oxyCODONE (OXY IR/ROXICODONE) 5 MG immediate release tablet Take 1 tablet (5 mg total) by mouth every 6 (six) hours as needed for breakthrough pain or moderate pain. 10 tablet 0   oxymetazoline (AFRIN) 0.05 % nasal spray Place 1 spray into both nostrils daily as needed for congestion.     HYDROcodone bit-homatropine (HYCODAN) 5-1.5 MG/5ML syrup Take 5 mLs by mouth every 6 (six) hours as needed for cough.     amoxicillin-clavulanate (AUGMENTIN) 875-125 MG tablet Take 1 tablet by mouth 2 (two) times daily for 14 days. (Patient not taking: Reported on 09/21/2022) 28 tablet 0   senna-docusate (SENOKOT-S) 8.6-50 MG tablet Take 1 tablet by mouth at bedtime as needed for mild constipation. (Patient not taking: Reported on 09/21/2022) 30 tablet 0   No facility-administered medications prior to visit.    No Known Allergies Social History   Tobacco Use   Smoking status: Every Day    Packs/day: 1.00    Types: Cigarettes  Substance Use Topics   Alcohol use: Yes    Comment: Socially   Drug use: Not Currently    Types: Heroin    Comment: Previous use of heroin, quit 3 years ago    Immunization History  Administered Date(s) Administered   Td (Adult),unspecified 09/06/1994    Objective:  BP 124/79 (BP Location: Left Arm, Patient Position: Sitting)   Pulse 79   Temp 97.9 F (36.6 C) (Temporal)   Resp 14   Ht 6' (1.829 m)   Wt 166 lb 3.2 oz (75.4 kg)   SpO2 98%   BMI 22.54 kg/m  Body mass index is 22.54 kg/m.   Physical Exam Vitals and nursing note reviewed.  Constitutional:      General: He is not in  acute distress.    Appearance: Normal appearance. He is ill-appearing (  chronically ill-appearing, just looks tired/worn out, exhausted to move). He is not toxic-appearing or diaphoretic.  HENT:     Head: Normocephalic and atraumatic.  Eyes:     General: No scleral icterus.    Conjunctiva/sclera: Conjunctivae normal.  Pulmonary:     Effort: Pulmonary effort is normal. No tachypnea, accessory muscle usage, respiratory distress or retractions.     Comments: Ambulates very slowly to limit increased respiratory effort - appears to be uncomfortable with taking deep breaths Skin:    General: Skin is warm and dry.  Neurological:     General: No focal deficit present.     Mental Status: He is alert.  Psychiatric:        Mood and Affect: Mood normal.        Behavior: Behavior normal.      Results Reviewed: Results for orders placed or performed during the hospital encounter of 09/02/22  Comprehensive metabolic panel  Result Value Ref Range   Sodium 133 (L) 135 - 145 mmol/L   Potassium 4.1 3.5 - 5.1 mmol/L   Chloride 101 98 - 111 mmol/L   CO2 24 22 - 32 mmol/L   Glucose, Bld 131 (H) 70 - 99 mg/dL   BUN 10 6 - 20 mg/dL   Creatinine, Ser 8.29 0.61 - 1.24 mg/dL   Calcium 8.0 (L) 8.9 - 10.3 mg/dL   Total Protein 5.4 (L) 6.5 - 8.1 g/dL   Albumin 2.1 (L) 3.5 - 5.0 g/dL   AST 38 15 - 41 U/L   ALT 49 (H) 0 - 44 U/L   Alkaline Phosphatase 80 38 - 126 U/L   Total Bilirubin 0.4 0.3 - 1.2 mg/dL   GFR, Estimated >56 >21 mL/min   Anion gap 8 5 - 15  CBC  Result Value Ref Range   WBC 14.6 (H) 4.0 - 10.5 K/uL   RBC 4.08 (L) 4.22 - 5.81 MIL/uL   Hemoglobin 12.7 (L) 13.0 - 17.0 g/dL   HCT 30.8 (L) 65.7 - 84.6 %   MCV 86.5 80.0 - 100.0 fL   MCH 31.1 26.0 - 34.0 pg   MCHC 36.0 30.0 - 36.0 g/dL   RDW 96.2 95.2 - 84.1 %   Platelets 273 150 - 400 K/uL   nRBC 0.0 0.0 - 0.2 %  CBC  Result Value Ref Range   WBC 12.1 (H) 4.0 - 10.5 K/uL   RBC 4.08 (L) 4.22 - 5.81 MIL/uL   Hemoglobin 12.1 (L) 13.0  - 17.0 g/dL   HCT 32.4 (L) 40.1 - 02.7 %   MCV 88.7 80.0 - 100.0 fL   MCH 29.7 26.0 - 34.0 pg   MCHC 33.4 30.0 - 36.0 g/dL   RDW 25.3 66.4 - 40.3 %   Platelets 302 150 - 400 K/uL   nRBC 0.0 0.0 - 0.2 %  Basic metabolic panel  Result Value Ref Range   Sodium 137 135 - 145 mmol/L   Potassium 3.5 3.5 - 5.1 mmol/L   Chloride 104 98 - 111 mmol/L   CO2 24 22 - 32 mmol/L   Glucose, Bld 135 (H) 70 - 99 mg/dL   BUN 8 6 - 20 mg/dL   Creatinine, Ser 4.74 0.61 - 1.24 mg/dL   Calcium 8.0 (L) 8.9 - 10.3 mg/dL   GFR, Estimated >25 >95 mL/min   Anion gap 9 5 - 15  CBC  Result Value Ref Range   WBC 10.1 4.0 - 10.5 K/uL   RBC 3.91 (L) 4.22 -  5.81 MIL/uL   Hemoglobin 11.8 (L) 13.0 - 17.0 g/dL   HCT 40.933.9 (L) 81.139.0 - 91.452.0 %   MCV 86.7 80.0 - 100.0 fL   MCH 30.2 26.0 - 34.0 pg   MCHC 34.8 30.0 - 36.0 g/dL   RDW 78.212.7 95.611.5 - 21.315.5 %   Platelets 341 150 - 400 K/uL   nRBC 0.0 0.0 - 0.2 %  CBC with Differential/Platelet  Result Value Ref Range   WBC 11.2 (H) 4.0 - 10.5 K/uL   RBC 4.19 (L) 4.22 - 5.81 MIL/uL   Hemoglobin 12.5 (L) 13.0 - 17.0 g/dL   HCT 08.636.8 (L) 57.839.0 - 46.952.0 %   MCV 87.8 80.0 - 100.0 fL   MCH 29.8 26.0 - 34.0 pg   MCHC 34.0 30.0 - 36.0 g/dL   RDW 62.912.2 52.811.5 - 41.315.5 %   Platelets 378 150 - 400 K/uL   nRBC 0.0 0.0 - 0.2 %   Neutrophils Relative % 66 %   Neutro Abs 7.4 1.7 - 7.7 K/uL   Lymphocytes Relative 19 %   Lymphs Abs 2.1 0.7 - 4.0 K/uL   Monocytes Relative 7 %   Monocytes Absolute 0.8 0.1 - 1.0 K/uL   Eosinophils Relative 5 %   Eosinophils Absolute 0.6 (H) 0.0 - 0.5 K/uL   Basophils Relative 1 %   Basophils Absolute 0.1 0.0 - 0.1 K/uL   Immature Granulocytes 2 %   Abs Immature Granulocytes 0.22 (H) 0.00 - 0.07 K/uL  Comprehensive metabolic panel  Result Value Ref Range   Sodium 140 135 - 145 mmol/L   Potassium 4.0 3.5 - 5.1 mmol/L   Chloride 110 98 - 111 mmol/L   CO2 24 22 - 32 mmol/L   Glucose, Bld 113 (H) 70 - 99 mg/dL   BUN 5 (L) 6 - 20 mg/dL   Creatinine, Ser  2.440.86 0.61 - 1.24 mg/dL   Calcium 8.5 (L) 8.9 - 10.3 mg/dL   Total Protein 5.2 (L) 6.5 - 8.1 g/dL   Albumin 2.3 (L) 3.5 - 5.0 g/dL   AST 28 15 - 41 U/L   ALT 39 0 - 44 U/L   Alkaline Phosphatase 69 38 - 126 U/L   Total Bilirubin 0.6 0.3 - 1.2 mg/dL   GFR, Estimated >01>60 >02>60 mL/min   Anion gap 6 5 - 15  Magnesium  Result Value Ref Range   Magnesium 2.0 1.7 - 2.4 mg/dL  Phosphorus  Result Value Ref Range   Phosphorus 4.0 2.5 - 4.6 mg/dL

## 2022-09-21 NOTE — Assessment & Plan Note (Signed)
Agreed to continuation oxycodone Declined to refill hycodan  used a decongestant tussin DM instead Expressed concern about combining oxycodone and valium.

## 2022-09-21 NOTE — Assessment & Plan Note (Addendum)
Seems stable with his breathing and a rested upright position but has limited ability for movement without getting short of breath I assisted him with completing paperwork for fitness for duty in accordance with his wishes -it is unlikely that his employer will be able to work with the restrictions that I believe are most medically appropriate and so I am also writing a note that he should be approved for short-term disability until he can recover more and I will see him back in 1 month to reassess his degree of recovery. Based on my discussion and exam with him today I believe him to have made some but very minimal recovery thus far from the severe life-threatening lung infection that he had just 2 weeks ago. I assisted with getting an x-ray for him to review with pulmonology when he sees them in 2 more weeks I offered additional antibiotics if he is interested

## 2022-09-21 NOTE — Patient Instructions (Addendum)
It was a pleasure seeing you today! I truly hope you feel like you received 5 star service and please let me know if there is anything I can improve.  Carl Olszewski, MD   Today the plan is...  Disability examination  Hyponatremia  Tobacco use  Hyperglycemia  Normocytic anemia -     CBC; Future  LFT elevation -     Comprehensive metabolic panel; Future -     TSH -     Lipid panel; Future -     Hepatitis C antibody; Future  Hypomagnesemia -     Magnesium -     Phosphorus  Hypophosphatemia -     Magnesium -     Phosphorus  Community acquired pneumonia of right lower lobe of lung Assessment & Plan: Seems stable with his breathing and a rested upright position but has limited ability for movement without getting short of breath I assisted him with completing paperwork for fitness for duty in accordance with his wishes I assisted with getting an x-ray for him to review with pulmonology when he sees them in 2 more weeks I offered additional antibiotics if he is interested  Orders: -     DG Chest 2 View; Future  Anxiety disorder, unspecified type -     diazePAM; Take 1 tablet (2 mg total) by mouth every 6 (six) hours as needed for anxiety (do not mix with oxycodone).  Dispense: 30 tablet; Refill: 0  Chest pain, unspecified type -     oxyCODONE-Acetaminophen; Take 1 tablet by mouth every 4 (four) hours as needed for up to 5 days for severe pain (do not mix with valium alcohol or other sedative).  Dispense: 20 tablet; Refill: 0  Tobacco dependence Assessment & Plan: Given crazy dreams with the patches we will try the gums as I think it is the nicotine stimulation overnight that is causing the dreams  Orders: -     Nicotine Polacrilex; Take 1 each (4 mg total) by mouth as needed for smoking cessation.  Dispense: 100 each; Refill: 11         [x]  RETURN TO CLINIC: Return in about 1 month (around 10/21/2022) for disability reeavluation.   - If you are not doing well:  RETURN to the office sooner. - Please bring all your medicines to each appointment.  - If your condition begins to worsen or become severe:  GO to the ER.  [x]  QUESTIONS/CONCERNS:  If you have follow-up questions / concerns:  - CLINICAL: please contact me via phone [phone] OR MyChart messaging  - LAB & IMAGING RESULTS you will be contacted with the lab results as soon as they are available. For any labs or imaging tests, we will call you if the results are significantly abnormal.  Most normal results will be posted to myChart as soon as they are available and I will comment on them there within 2-3 business days.  The fastest way to get your results is to activate your My Chart account. Instructions are located on the last page of this paperwork. If you have not heard from regarding the results in 2 weeks, please contact this office.  - BILLING: xray and lab orders are billed from separate companies and questions./concerns should be directed to the invoicing company.  For visit charges please discuss with our administrative services

## 2022-09-22 ENCOUNTER — Encounter: Payer: Self-pay | Admitting: Internal Medicine

## 2022-09-22 ENCOUNTER — Telehealth: Payer: Self-pay | Admitting: Student in an Organized Health Care Education/Training Program

## 2022-09-22 ENCOUNTER — Telehealth: Payer: Self-pay | Admitting: Internal Medicine

## 2022-09-22 LAB — LIPID PANEL
Cholesterol: 209 mg/dL — ABNORMAL HIGH (ref 0–200)
HDL: 31.3 mg/dL — ABNORMAL LOW (ref >39.00)
NonHDL: 177.86
Total CHOL/HDL Ratio: 7
Triglycerides: 224 mg/dL — ABNORMAL HIGH (ref 0.0–149.0)
VLDL: 44.8 mg/dL — ABNORMAL HIGH (ref 0.0–40.0)

## 2022-09-22 LAB — HEPATITIS C ANTIBODY: Hepatitis C Ab: NONREACTIVE

## 2022-09-22 NOTE — Telephone Encounter (Signed)
Received these forms in Dr.Morrison's mailbox today.

## 2022-09-22 NOTE — Telephone Encounter (Signed)
Patient dropped off short term disability forms at [place] office last week.  I have reveiwed the forms and spoken to Amanda in the [facility] office.  Dr. Aundria Rud will not be able to complete the form until after he sees the patient on 12/5 and can make an assessment of his medical condition and physical capabilities.  Patient saw Dr. Burnett Corrente at [facility] on 11/16 and office notes document the patient's condition.  Dr. [name] also wrote a letter for patient to stay out of work until 12/17.  I called patient and left a voice message letting him know we are sending the form to Dr. [name] to complete.  I also called Dr. Kandra Nicolas office and spoke to Marion.  Then faxed the form to [facility] fax# [phone].

## 2022-09-22 NOTE — Telephone Encounter (Signed)
.  Type of form received: Disability  Additional comments:   Received by: Lorene Dy  Form should be Faxed to: [phone]  Form should be mailed to:    Is patient requesting call for pickup:   Form placed:  In provider's box  Attach charge sheet. yes  Individual made aware of 3-5 business day turn around (Y/N)?

## 2022-09-25 ENCOUNTER — Other Ambulatory Visit: Payer: Self-pay | Admitting: Internal Medicine

## 2022-09-25 DIAGNOSIS — R079 Chest pain, unspecified: Secondary | ICD-10-CM

## 2022-09-26 NOTE — Telephone Encounter (Signed)
Patient called for status of FMLA form - he did not get my voice message on 11/17.  I let him know Dr. [name] has the forms.  Spoke to someone at Dr. Kandra Nicolas office and she said she would let him know the form needs to be completed ASAP.  Also will call patient to come pick up the form when it is complete.  I let Carl Le know this information.

## 2022-09-26 NOTE — Telephone Encounter (Signed)
Patient states: -FMLA disability forms should be faxed to his employer instead of to [employer]. FAX to [phone]. - HR manager informed him that if it sent to insurance and not employer it would not be seen as complete since employer still needs to fill out some parts.   Patient would like this done as soon as possible.

## 2022-09-26 NOTE — Telephone Encounter (Signed)
Carl Le with [facility] called for status of FMLA Disability forms. States Patient requests to be called when above form has been completed due to Patient needing forms asap.

## 2022-09-27 ENCOUNTER — Telehealth (INDEPENDENT_AMBULATORY_CARE_PROVIDER_SITE_OTHER): Payer: BLUE CROSS/BLUE SHIELD | Admitting: Internal Medicine

## 2022-09-27 DIAGNOSIS — R079 Chest pain, unspecified: Secondary | ICD-10-CM | POA: Diagnosis not present

## 2022-09-27 DIAGNOSIS — F17200 Nicotine dependence, unspecified, uncomplicated: Secondary | ICD-10-CM

## 2022-09-27 MED ORDER — IBUPROFEN 800 MG PO TABS: 800.0000 mg | ORAL_TABLET | Freq: Three times a day (TID) | ORAL | 3 refills | Status: DC | PRN

## 2022-09-27 MED ORDER — BUPROPION HCL ER (XL) 150 MG PO TB24: 150.0000 mg | ORAL_TABLET | Freq: Every day | ORAL | 3 refills | Status: DC

## 2022-09-27 MED ORDER — OXYCODONE HCL 5 MG PO TABS: 10.0000 mg | ORAL_TABLET | Freq: Four times a day (QID) | ORAL | 0 refills | Status: DC | PRN

## 2022-09-27 NOTE — Assessment & Plan Note (Signed)
Extend oxycodone and increase dose but limit duration and try to taper Add ibuprofen 800  Cont/w OTC cough suppressant Strongly encouraged discontinue smoking Has patches and gums and using gums still smoking some- patches caused crazy dreams.

## 2022-09-29 ENCOUNTER — Encounter: Payer: Self-pay | Admitting: Internal Medicine

## 2022-09-29 NOTE — Progress Notes (Signed)
[facility]: @APPTDEPPHONE @   Office Visit  Patient:  Carl Le      Age: 41 y.o.       Sex:  male  Date:   09/29/2022  PCP:    10/01/2022, MD    Today's Healthcare Provider: Lula Olszewski, MD  Assessment/Plan:   Today's Telemedicine visit was conducted via Video for 61m 57s after consent for telemedicine was obtained:  Video connection was never lost. Location of the Healthcare Provider:   [address] at [facility]  [address] [place], [place] Location of the Patient / Client: [address] [place]  Video Call Participants - all identities confirmed visually and verbally: Healthcare Provider:  [id number], MD  Patient / Client: Carl Le  Patient support:  None     Jeremian was seen today for medication refill.  Chest pain, unspecified type Overview: Severe associated with chest tube pneumonia with parapneumonic effusion.  Hurts to breath and hurts to cough. Pain severe- persistent. Prolonged hospitalization Chest pain is worsened by coughing Percocet has not controlled.   Assessment & Plan: Extend oxycodone and increase dose but limit duration and try to taper Add ibuprofen 800  Cont/w OTC cough suppressant Strongly encouraged discontinue smoking Has patches and gums and using gums still smoking some- patches caused crazy dreams.   Orders: -     Ibuprofen; Take 1 tablet (800 mg total) by mouth every 8 (eight) hours as needed.  Dispense: 90 tablet; Refill: 3 -     oxyCODONE HCl; Take 2 tablets (10 mg total) by mouth every 6 (six) hours as needed for up to 7 days for breakthrough pain or moderate pain.  Dispense: 56 tablet; Refill: 0  Smoking -     buPROPion HCl ER (XL); Take 1 tablet (150 mg total) by mouth daily.  Dispense: 90 tablet; Refill: 3    We discussed that continuation of oxycodone above is planned for only a  little longer, 1 more week and after that my intent is for him to taper off with this 7 days supply.  If he feels he needs continuation of medication after that I will use suboxone for enhanced safety and history of remote SUD.  We had extended conversation on safe use and tapering, and prescription drug monitoring program was reviewed      Subjective:   KYLON Le is a 41 y.o. male with past medical history including: Past Medical History:  Diagnosis Date   Allergy    Anxiety disorder 09/21/2022   Hydroxyzine failing Valium helped in hospital Being sure of breath has been really scary and makes him breathe harder and makes him feel sick with concern for respiratory failure so I agreed to additional Valium but warned him about the risk of addiction   Current smoker    Depression    Herpes zoster    Kidney calculus    Mild emphysema (HCC) 08/30/2022   noted on ct scan   Pneumonia 2023   Tobacco dependence 09/21/2022    He presented today reporting reason for visit as: Chief Complaint  Patient presents with   Medication Refill    Oxycodone, completely out. Requests increase of dosage. Was wondering if the nasal     Problem focused charting was used to record today's medical interview as follows: Problem  Chest Pain   Severe associated with chest tube pneumonia with parapneumonic  effusion.  Hurts to breath and hurts to cough. Pain severe- persistent. Prolonged hospitalization Chest pain is worsened by coughing Percocet has not controlled.            Objective:  Physical Exam: There were no vitals taken for this visit.  Physical Exam Vitals and nursing note reviewed.  Constitutional:      General: He is not in acute distress.    Appearance: Normal appearance. He is not ill-appearing, toxic-appearing or diaphoretic.  HENT:     Head: Normocephalic and atraumatic.     Nose: Nose normal.  Eyes:     General: No scleral icterus.    Conjunctiva/sclera: Conjunctivae  normal.  Skin:    General: Skin is warm and dry.  Neurological:     Mental Status: He is alert.  Psychiatric:        Mood and Affect: Mood normal.        Behavior: Behavior normal.     Patient on video for exam

## 2022-10-02 ENCOUNTER — Other Ambulatory Visit: Payer: Self-pay | Admitting: Internal Medicine

## 2022-10-02 DIAGNOSIS — Z0279 Encounter for issue of other medical certificate: Secondary | ICD-10-CM

## 2022-10-02 DIAGNOSIS — R079 Chest pain, unspecified: Secondary | ICD-10-CM

## 2022-10-02 DIAGNOSIS — F419 Anxiety disorder, unspecified: Secondary | ICD-10-CM

## 2022-10-02 NOTE — Telephone Encounter (Signed)
Called and spoke to pt, faxing disability forms to employer now.

## 2022-10-03 NOTE — Telephone Encounter (Signed)
Patient requests to be contacted regarding if appointment has to be in person or can be virtual. Patient states he prefers virtual because it is a 40 minute drive to office.

## 2022-10-03 NOTE — Telephone Encounter (Signed)
Carl Le at [employer] employment requests Disability Form be scanned and emailed to [name] at email: [email or web address] Due to fax received is illegible  Form was scanned and emailed by Hasna to [facility] as requested.  Kim confirmed receipt of the above paperwork and it is legible

## 2022-10-05 ENCOUNTER — Telehealth (INDEPENDENT_AMBULATORY_CARE_PROVIDER_SITE_OTHER): Payer: BLUE CROSS/BLUE SHIELD | Admitting: Internal Medicine

## 2022-10-05 ENCOUNTER — Telehealth: Payer: Self-pay | Admitting: Internal Medicine

## 2022-10-05 DIAGNOSIS — F17218 Nicotine dependence, cigarettes, with other nicotine-induced disorders: Secondary | ICD-10-CM | POA: Diagnosis not present

## 2022-10-05 DIAGNOSIS — F17200 Nicotine dependence, unspecified, uncomplicated: Secondary | ICD-10-CM

## 2022-10-05 DIAGNOSIS — J42 Unspecified chronic bronchitis: Secondary | ICD-10-CM | POA: Insufficient documentation

## 2022-10-05 DIAGNOSIS — F419 Anxiety disorder, unspecified: Secondary | ICD-10-CM

## 2022-10-05 DIAGNOSIS — R079 Chest pain, unspecified: Secondary | ICD-10-CM

## 2022-10-05 MED ORDER — DIAZEPAM 2 MG PO TABS: 2.0000 mg | ORAL_TABLET | Freq: Four times a day (QID) | ORAL | 0 refills | Status: DC | PRN

## 2022-10-05 MED ORDER — OXYCODONE HCL 5 MG PO TABS: 10.0000 mg | ORAL_TABLET | Freq: Four times a day (QID) | ORAL | 0 refills | Status: DC | PRN

## 2022-10-05 MED ORDER — NICOTINE POLACRILEX 4 MG MT LOZG: 4.0000 mg | LOZENGE | OROMUCOSAL | 11 refills | Status: DC | PRN

## 2022-10-05 MED ORDER — ALBUTEROL SULFATE HFA 108 (90 BASE) MCG/ACT IN AERS: 2.0000 | INHALATION_SPRAY | Freq: Four times a day (QID) | RESPIRATORY_TRACT | 2 refills | Status: DC | PRN

## 2022-10-05 MED ORDER — SERTRALINE HCL 50 MG PO TABS: 50.0000 mg | ORAL_TABLET | Freq: Every day | ORAL | 3 refills | Status: DC

## 2022-10-05 MED ORDER — HYDROXYZINE HCL 50 MG PO TABS: 50.0000 mg | ORAL_TABLET | Freq: Two times a day (BID) | ORAL | 3 refills | Status: DC

## 2022-10-05 NOTE — Assessment & Plan Note (Addendum)
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 He has been forthcoming about his history throughout treatment and so I believe we can work together to prevent any future bad outcomes for him. Hope to transition to suboxone tablet next week after all these other medication changes today have chance to settle in.

## 2022-10-05 NOTE — Progress Notes (Signed)
[facility]: [phone]   Routine Virtual Medical Office Visit  Patient:  Carl Le      Age: 41 y.o.       Sex:  male  Date:   10/05/2022  PCP:    Lula Olszewski, MD   Today's Healthcare Provider: Lula Olszewski, MD  Assessment/Plan:     Essa was seen today for medication refill.  Chronic bronchitis, unspecified chronic bronchitis type (HCC) -     Albuterol Sulfate HFA; Inhale 2 puffs into the lungs every 6 (six) hours as needed for wheezing or shortness of breath.  Dispense: 6.7 g; Refill: 2  Anxiety disorder, unspecified type Overview: Hydroxyzine 10 did nothing Valium helped in hospital so its been continued short term only Being sure of breath has been really scary and makes him breathe harder and makes him feel sick with concern for respiratory failure so I agreed to additional Valium but warned him about the risk of addiction Warned him of risk with opioids but he reports he combined them safely in hospital.   Assessment & Plan: One more week valium, but try to taper Increase atarax from 10->50 to make that more manageable Thinking longer term, restart Zoloft and discussed not to sudden stop or judge it prematurely Soon follow up Monday to discuss possibly getting behavioral health or psychiatry support but I think this will go down as he continues to recover   Orders: -     diazePAM; Take 1 tablet (2 mg total) by mouth every 6 (six) hours as needed for anxiety (do not mix with oxycodone).  Dispense: 30 tablet; Refill: 0 -     hydrOXYzine HCl; Take 1 tablet (50 mg total) by mouth in the morning and at bedtime.  Dispense: 180 tablet; Refill: 3 -     Sertraline HCl; Take 1 tablet (50 mg total) by mouth daily. Do not sudden stop Start at half tablet first 2 weeks  Dispense: 90 tablet; Refill: 3  Chest pain, unspecified type Overview: Severe associated with chest tube pneumonia with parapneumonic effusion.  Hurts to breath and hurts  to cough. Pain severe- persistent. Prolonged hospitalization Chest pain is worsened by coughing Percocet has not controlled.   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 He has been forthcoming about his history throughout treatment and so I believe we can work together to prevent any future bad outcomes for him. Hope to transition to suboxone tablet next week after all these other medication changes today have chance to settle in.  Orders: -     oxyCODONE HCl; Take 2 tablets (10 mg total) by mouth every 6 (six) hours as needed for up to 7 days for breakthrough pain or moderate pain.  Dispense: 56 tablet; Refill: 0  Cigarette nicotine dependence with other nicotine-induced disorder -     Nicotine Polacrilex; Take 1 lozenge (4 mg total) by mouth as needed for smoking cessation.  Dispense: 100 tablet; Refill: 11  Tobacco dependence Overview: Attempting discharge 09/2022 with wellbutrin and nic gum  No follow-ups on file.   Today's key discussion points and After Visit Summary (AVS) reminders.  He was encouraged to contact our office by phone or message via MyChart if he has any questions or concerns regarding our treatment plan (see AVS). He was given an opportunity to ask questions/clarifications about any aspect of the diagnosis and treatment plan at today's visit. Common side effects, risks, benefits, and alternatives for medications and treatment plan prescribed today were discussed. He expressed understanding of the given instructions.  We discussed red flag  symptoms and signs in detail and when to call the office or go to ER if his condition worsens (see AVS). He expressed understanding and AVS is used to reinforce. Medication list was reconciled and patient instructions and summary information was documented and made available for him to review in the AVS (see AVS).  This entire medical encounter document is also available on MyChart for him to review for accuracy and understanding.  Review of this document is encouraged in the AVS. No barriers to understanding were identified     Subjective:   Today's Telemedicine visit was conducted via Video for 48m 12s after consent for telemedicine was obtained: Video connection was never lost. Location of the Healthcare Provider:   Nature conservation officer at [facility]  [address] [place], [place] [address] Location of the Patient / Client: he was sitting in his car- presumably in [place] [address]  Video Call Participants - all identities confirmed visually and verbally: Healthcare Provider:  Lula Olszewski, MD  Patient / Client: Carl Le  Patient support:  None   he seemed completely alone throughout the visit  Carl Le is a 41 y.o. male with past medical history including: Past Medical History:  Diagnosis Date   Allergy    Anxiety disorder 09/21/2022   Hydroxyzine failing Valium helped in hospital Being sure of breath has been really scary and makes him breathe harder and makes him feel sick with concern for respiratory failure so I agreed to additional Valium but warned him about the risk of addiction   Current smoker    Depression    Herpes zoster    Kidney calculus    Mild emphysema (HCC) 08/30/2022   noted on ct scan   Pneumonia 2023   Tobacco dependence 09/21/2022     Listed as currently taking:  Current Outpatient Medications:    acetaminophen (TYLENOL) 325 MG tablet, Take 2 tablets (650 mg total) by mouth every 6 (six) hours as needed for  mild pain (or Fever >/= 101)., Disp: 20 tablet, Rfl: 0   buPROPion (WELLBUTRIN XL) 150 MG 24 hr tablet, Take 1 tablet (150 mg total) by mouth daily., Disp: 90 tablet, Rfl: 3   ibuprofen (ADVIL) 800 MG tablet, Take 1 tablet (800 mg total) by mouth every 8 (eight) hours as needed., Disp: 90 tablet, Rfl: 3   nicotine polacrilex (NICORETTE) 4 MG gum, Take 1 each (4 mg total) by mouth as needed for smoking cessation., Disp: 100 each, Rfl: 11   nicotine polacrilex (NICOTINE MINI) 4 MG lozenge, Take 1 lozenge (4 mg total) by mouth as needed for smoking cessation., Disp: 100 tablet, Rfl: 11   oxymetazoline (AFRIN) 0.05 % nasal spray, Place 1 spray into both nostrils daily as needed for congestion., Disp: , Rfl:    albuterol (VENTOLIN HFA) 108 (90 Base) MCG/ACT inhaler, Inhale 2 puffs into the lungs every 6 (six) hours as needed for wheezing or shortness  of breath., Disp: 6.7 g, Rfl: 2   dextromethorphan-guaiFENesin (TUSSIN DM) 10-100 MG/5ML liquid, Take 5 mLs by mouth every 4 (four) hours as needed for cough. (Patient not taking: Reported on 09/27/2022), Disp: 200 mL, Rfl: 1   diazepam (VALIUM) 2 MG tablet, Take 1 tablet (2 mg total) by mouth every 6 (six) hours as needed for anxiety (do not mix with oxycodone)., Disp: 30 tablet, Rfl: 0   hydrOXYzine (ATARAX) 50 MG tablet, Take 1 tablet (50 mg total) by mouth in the morning and at bedtime., Disp: 180 tablet, Rfl: 3   nicotine (NICODERM CQ - DOSED IN MG/24 HOURS) 21 mg/24hr patch, Place 1 patch (21 mg total) onto the skin daily. (Patient not taking: Reported on 09/27/2022), Disp: 28 patch, Rfl: 0   oxyCODONE (OXY IR/ROXICODONE) 5 MG immediate release tablet, Take 2 tablets (10 mg total) by mouth every 6 (six) hours as needed for up to 7 days for breakthrough pain or moderate pain., Disp: 56 tablet, Rfl: 0   senna-docusate (SENOKOT-S) 8.6-50 MG tablet, Take 1 tablet by mouth at bedtime as needed for mild constipation. (Patient not taking: Reported on 09/27/2022),  Disp: 30 tablet, Rfl: 0   sertraline (ZOLOFT) 50 MG tablet, Take 1 tablet (50 mg total) by mouth daily. Do not sudden stop Start at half tablet first 2 weeks, Disp: 90 tablet, Rfl: 3    He presented today reporting reason for visit as: Chief Complaint  Patient presents with   Medication Refill    Albuterol inhaler, valium and oxycodone (doesn't currently have a prescription for this).    Request 1 more extension on the valium and albuterol- same as prior but concerned that he just won't be able to control anxiety and pain as I've been trying to wean and he has history of dependency.  He wants to switch to suboxone at next visit if he can't manage to taper off but prior attempts to tolerate have been causative of migraines  The chest pain and shortness of breath from the pneumonia are improving but Wellbutrin is flaring anxiety although he has managed to reduce smoking- to no cigarettes in about a week(s)    Feels anxiety needs something more- used to be on Zoloft ? Maybe long ago with Klonopin- agreeable to try to address it back as he tapers last script valium.  Has been a lot of anxiety since adding Wellbutrin combined with recent near death experience from severe pneumonia and currently trying to quit smoking         Objective:  Physical Exam: There were no vitals taken for this visit.  Physical Exam  Problem-specific physical exam findings:  Appears anxious, limited by video feed to facial exam, no apparent focal neurological deficit(s)  Clear articulate speech, no signs & symptoms of inebriation or hypersomnolence- alert and well spoken.       Lula Olszewski, MD

## 2022-10-05 NOTE — Assessment & Plan Note (Signed)
One more week valium, but try to taper Increase atarax from 10->50 to make that more manageable Thinking longer term, restart Zoloft and discussed not to sudden stop or judge it prematurely Soon follow up Monday to discuss possibly getting behavioral health or psychiatry support but I think this will go down as he continues to recover

## 2022-10-05 NOTE — Telephone Encounter (Signed)
Patient states: - Pharmacy informed him that a Prior authorization is needed for his oxycodone to be filled  - Pharmacy told him that they faxed [place] a prior authorization  I informed patient of the possible 3-5 day turn around  and patient stated he can't wait that long.    Patient requests: - Any way that PCP can speed up process or avoid PA altogether   Please advise.

## 2022-10-06 NOTE — Telephone Encounter (Signed)
Patient states he spoke with insurance about why PA is required for RX for Oxycodone-was told it was because he got a RX for Oxycodone filled recently.  Pharmacy told Patient they were send a PA request asap.  Patient requests the PA be done asap.

## 2022-10-08 MED ORDER — DIAZEPAM 2 MG PO TABS: 2.0000 mg | ORAL_TABLET | Freq: Four times a day (QID) | ORAL | 0 refills | Status: DC | PRN

## 2022-10-08 MED ORDER — OXYCODONE HCL 5 MG PO TABS: 10.0000 mg | ORAL_TABLET | Freq: Four times a day (QID) | ORAL | 0 refills | Status: DC | PRN

## 2022-10-08 NOTE — Telephone Encounter (Signed)
I've sent already.  Please call pharmacy and see if they sent [place] prior authorization to complete.

## 2022-10-09 ENCOUNTER — Telehealth: Payer: Self-pay | Admitting: Internal Medicine

## 2022-10-09 ENCOUNTER — Encounter: Payer: Self-pay | Admitting: Internal Medicine

## 2022-10-09 ENCOUNTER — Telehealth (INDEPENDENT_AMBULATORY_CARE_PROVIDER_SITE_OTHER): Payer: BLUE CROSS/BLUE SHIELD | Admitting: Internal Medicine

## 2022-10-09 VITALS — Ht 72.0 in

## 2022-10-09 DIAGNOSIS — F1129 Opioid dependence with unspecified opioid-induced disorder: Secondary | ICD-10-CM | POA: Diagnosis not present

## 2022-10-09 MED ORDER — NALOXONE HCL 4 MG/0.1ML NA LIQD: 1.0000 | Freq: Once | NASAL | 0 refills | Status: AC

## 2022-10-09 MED ORDER — BUPRENORPHINE HCL-NALOXONE HCL 8-2 MG SL SUBL: 1.0000 | SUBLINGUAL_TABLET | Freq: Every day | SUBLINGUAL | 0 refills | Status: DC

## 2022-10-09 NOTE — Telephone Encounter (Signed)
Pt states: -Pharmacy told him they needed more information about Rx.  Pt Requests: -PCP team to reach out to Pharmacy.  buprenorphine-naloxone (SUBOXONE) 8-2 mg SUBL SL tablet [[id number]]   [id number] - [place], [place] - [address], [place] [address] Phone: [phone]  Fax: [phone] DEA #: [id number]

## 2022-10-09 NOTE — Telephone Encounter (Signed)
Pt was seen by virtual today with [name]  Patient Name: Carl Le Gender: Male DOB: 01-04-1981 Age: 41 Y 7 M 28 D Return Phone Number: [phone] (Primary) Address: City/ State/ Zip: [place] [address] Client [facility] - Human resources officer [facility] Night Provider Glenetta Hew- MD Contact Type Call Who Is Calling Patient / Member / Family / Caregiver Call Type Triage / Clinical Relationship To Patient Self Return Phone Number [phone] (Primary) Chief Complaint CHEST PAIN - pain, pressure, heaviness or tightness Reason for Call Symptomatic / Request for Health Information Initial Comment Caller states he needs his medication refilled and is completely out of oxycodone 5mg  since Wednesday. Pt is waiting on prior authorization for said RX. States he is having chest pain. States he has had pneumonia. Translation No Nurse Assessment Nurse: Friday, RN, Ronne Binning Date/Time (Eastern Time): 10/06/2022 5:38:50 PM Confirm and document reason for call. If symptomatic, describe symptoms. ---Caller states that he needs his medication refilled and is completely out of Oxycodone 5mg  since Wednesday. Caller is waiting on prior authorization for Oxycodone from the provider. He is having chest pain, he has had pneumonia since 10/22, states chest pain is not worse than what it's been. Denies new/worsening symptoms at this time, I verified with patient twice. Does the patient have any new or worsening symptoms? ---No Nurse: Monday, RN, 11/22 Date/Time (Eastern Time): 10/06/2022 5:40:06 PM Please select the assessment type ---Request for controlled medication refill Additional Documentation ---Oxycodone 5mg  refill request, he is out Is there an on-call physician for the client? ---Yes Do the client directives specifically allow for paging the on-call regarding scheduled drugs? ---No Disp. Time Lurena Joiner Time)  Disposition Final User 10/06/2022 5:37:17 PM Send to Urgent Queue 10/06/2022 5:43:43 PM Clinical Call Yes 14/11/2021, RN, Griffin Dakin Final Disposition 10/06/2022 5:43:43 PM Clinical Call Yes Ronne Binning, RN, Lurena Joiner Understands Yes PreDisposition Call Pharmacist

## 2022-10-09 NOTE — Telephone Encounter (Signed)
Patient requests Pharmacy be contacted asap re:  buprenorphine-naloxone (SUBOXONE) 8-2 mg SUBL SL tablet [[id number]]

## 2022-10-09 NOTE — Telephone Encounter (Signed)
Patient states: -Although medication was sent in he is not able to pick up  - Pharmacy stated more info is needed especially since ocxycodone was just sent in prior to suboxene  Patient requests: -A callback from either PCP or PCP team about medication

## 2022-10-09 NOTE — Progress Notes (Signed)
[facility]: [id number]   Routine Virtual Medical Office Visit  Patient:  Carl Le      Age: 41 y.o.       Sex:  male  Date:   10/09/2022  PCP:    Carl Le, [facility] Provider: Loralee Pacas, MD  Assessment/Plan:    Carl Le was seen today for medication follow-up.  Opioid dependence with opioid-induced disorder (HCC) -     Buprenorphine HCl-Naloxone HCl; Place 1 tablet under the tongue daily.  Dispense: 9 tablet; Refill: 0  Other orders -     Naloxone HCl; Place 1 spray into the nose once for 1 dose. Use for over sedation for anyone suspected of overdose.  Dispense: 1 g; Refill: 0   We had 20 min discussion on risks and benefits suboxone after discussing the situation where over the weekend he wasn't able to get insurance to cover oxycodone - they refused without prior authorization and pharmacy refused to fill without that.   He is ready  to try suboxone tablet- remote history of migraine with the film.  He reported he had difficulty getting off opioids in past and caused legal problems prior - remote history.  Reports he has been using only as prescribed in recent memory.   He is having a lot of pain in chest still from recent pneumonia- and a lot of pain in back too - coughing a lot.   Not getting oxycodone as prescribed led to smoking cig relapse   Follow up in office 10-18-22 to establish opioid dependence controlled substance pxg contract.     Today's key discussion points and After Visit Summary (AVS) reminders.  He was encouraged to contact our office by phone or message via MyChart if he has any questions or concerns regarding our treatment plan (see AVS). He was given an opportunity to ask questions/clarifications about any aspect of the diagnosis and treatment plan at today's visit. Common side effects, risks, benefits, and alternatives for medications and treatment plan prescribed today were discussed. He expressed  understanding of the given instructions.  Suboxone is to minimize withdrawal symptom(s) and causes constipation and dangerous with other sedatives.  Medication list was reconciled and patient instructions and summary information was documented and made available for him to review in the AVS (see AVS).  This entire medical encounter document is also available on MyChart for him to review for accuracy and understanding.  Review of this document is encouraged in the AVS. No barriers to understanding were identified     Subjective:   Carl Le is a 41 y.o. male with past medical history including: Past Medical History:  Diagnosis Date   Allergy    Anxiety disorder 09/21/2022   Hydroxyzine failing Valium helped in hospital Being sure of breath has been really scary and makes him breathe harder and makes him feel sick with concern for respiratory failure so I agreed to additional Valium but warned him about the risk of addiction   Current smoker    Depression    Herpes zoster    Kidney calculus    Mild emphysema ([facility]) 08/30/2022   noted on ct scan   Pneumonia 2023   Tobacco dependence 09/21/2022     Listed as currently taking:  Current Outpatient Medications:    acetaminophen (TYLENOL) 325 MG tablet, Take 2 tablets (650 mg total) by mouth every 6 (six) hours as needed for mild pain (or Fever >/= 101)., Disp: 20  tablet, Rfl: 0   albuterol (VENTOLIN HFA) 108 (90 Base) MCG/ACT inhaler, Inhale 2 puffs into the lungs every 6 (six) hours as needed for wheezing or shortness of breath., Disp: 6.7 g, Rfl: 2   buprenorphine-naloxone (SUBOXONE) 8-2 mg SUBL SL tablet, Place 1 tablet under the tongue daily., Disp: 9 tablet, Rfl: 0   buPROPion (WELLBUTRIN XL) 150 MG 24 hr tablet, Take 1 tablet (150 mg total) by mouth daily., Disp: 90 tablet, Rfl: 3   dextromethorphan-guaiFENesin (TUSSIN DM) 10-100 MG/5ML liquid, Take 5 mLs by mouth every 4 (four) hours as needed for cough., Disp: 200 mL, Rfl: 1    diazepam (VALIUM) 2 MG tablet, Take 1 tablet (2 mg total) by mouth every 6 (six) hours as needed for anxiety (do not mix with oxycodone)., Disp: 30 tablet, Rfl: 0   diazepam (VALIUM) 2 MG tablet, Take 1 tablet (2 mg total) by mouth every 6 (six) hours as needed for anxiety (do not mix with oxycodone)., Disp: 30 tablet, Rfl: 0   hydrOXYzine (ATARAX) 50 MG tablet, Take 1 tablet (50 mg total) by mouth in the morning and at bedtime., Disp: 180 tablet, Rfl: 3   ibuprofen (ADVIL) 800 MG tablet, Take 1 tablet (800 mg total) by mouth every 8 (eight) hours as needed., Disp: 90 tablet, Rfl: 3   naloxone (NARCAN) nasal spray 4 mg/0.1 mL, Place 1 spray into the nose once for 1 dose. Use for over sedation for anyone suspected of overdose., Disp: 1 g, Rfl: 0   nicotine (NICODERM CQ - DOSED IN MG/24 HOURS) 21 mg/24hr patch, Place 1 patch (21 mg total) onto the skin daily., Disp: 28 patch, Rfl: 0   nicotine polacrilex (NICORETTE) 4 MG gum, Take 1 each (4 mg total) by mouth as needed for smoking cessation., Disp: 100 each, Rfl: 11   nicotine polacrilex (NICOTINE MINI) 4 MG lozenge, Take 1 lozenge (4 mg total) by mouth as needed for smoking cessation., Disp: 100 tablet, Rfl: 11   oxyCODONE (OXY IR/ROXICODONE) 5 MG immediate release tablet, Take 2 tablets (10 mg total) by mouth every 6 (six) hours as needed for up to 7 days for breakthrough pain or moderate pain., Disp: 56 tablet, Rfl: 0   oxyCODONE (OXY IR/ROXICODONE) 5 MG immediate release tablet, Take 2 tablets (10 mg total) by mouth every 6 (six) hours as needed for up to 7 days for breakthrough pain or moderate pain., Disp: 56 tablet, Rfl: 0   oxymetazoline (AFRIN) 0.05 % nasal spray, Place 1 spray into both nostrils daily as needed for congestion., Disp: , Rfl:    senna-docusate (SENOKOT-S) 8.6-50 MG tablet, Take 1 tablet by mouth at bedtime as needed for mild constipation., Disp: 30 tablet, Rfl: 0   sertraline (ZOLOFT) 50 MG tablet, Take 1 tablet (50 mg total) by  mouth daily. Do not sudden stop Start at half tablet first 2 weeks, Disp: 90 tablet, Rfl: 3    He presented today reporting reason for visit as: Chief Complaint  Patient presents with   Medication follow-up   Struggling with persistent chest pain and withdrawal from oxycodone          Objective:  Physical Exam: Ht 6' (1.829 m)   BMI 22.54 kg/m   Physical Exam  Problem-specific physical exam findings:  Limited exam due to video- he seemed clear headed and alert, not intoxicated appearing.      Lula Olszewski, MD  Today's Telemedicine Video Virtual Visit was conducted via Video for 63m [phone]  after consent for telemedicine was obtained: Video connection was never lost. Location of the Healthcare Provider:   Nature conservation officer at [facility]  [address] [place], [place] [address] Location of the Patient / Client: [address] [place] [address]  Video Call Participants - all identities confirmed visually and verbally: Healthcare Provider:  Lula Olszewski, MD  Patient / Client: Carl Le  Patient support:  None

## 2022-10-09 NOTE — Telephone Encounter (Signed)
This has already been taken care of

## 2022-10-10 ENCOUNTER — Telehealth (INDEPENDENT_AMBULATORY_CARE_PROVIDER_SITE_OTHER): Payer: BLUE CROSS/BLUE SHIELD | Admitting: Internal Medicine

## 2022-10-10 ENCOUNTER — Encounter: Payer: Self-pay | Admitting: Internal Medicine

## 2022-10-10 ENCOUNTER — Inpatient Hospital Stay: Payer: BLUE CROSS/BLUE SHIELD | Admitting: Student in an Organized Health Care Education/Training Program

## 2022-10-10 DIAGNOSIS — F1129 Opioid dependence with unspecified opioid-induced disorder: Secondary | ICD-10-CM | POA: Diagnosis not present

## 2022-10-10 MED ORDER — BUPRENORPHINE HCL-NALOXONE HCL 8-2 MG SL SUBL: 1.0000 | SUBLINGUAL_TABLET | Freq: Every day | SUBLINGUAL | 0 refills | Status: DC

## 2022-10-10 NOTE — Addendum Note (Signed)
Addended by: Lula Olszewski on: 10/10/2022 10:52 AM   Modules accepted: Orders

## 2022-10-10 NOTE — Telephone Encounter (Signed)
Spoke with patient and he confirmed that he was able to pick up his prescription.

## 2022-10-10 NOTE — Telephone Encounter (Signed)
Patient requests to be called at ph# [phone] for the status of request/[facility] has not been contacted yet.

## 2022-10-10 NOTE — Progress Notes (Signed)
Carl Le: [phone]   Routine Virtual Video Medical Office Visit  Patient:  Carl Le      Age: 41 y.o.       Sex:  male  Date:   10/10/2022  PCP:    Lula Olszewski, MD   Today's Healthcare Provider: Lula Olszewski, MD  Assessment/Plan:     Carl Le was seen today for medication problem.  Opioid dependence with opioid-induced disorder (HCC) -     Buprenorphine HCl-Naloxone HCl; Place 1 tablet under the tongue daily.  Dispense: 8 tablet; Refill: 0  Today was just a brief video visit to ensure that the pharmacy will fill the medication as prescribed because yesterday the Suboxone I sent in they refused to fill until I called and he wanted to make sure that I took care of it today as he is in withdrawal.  I phone called while he was on the phone to the pharmacy and clarified that I wanted him to receive Suboxone tablets because they thought that I had sent Subutex although my records clearly show sending Suboxone.  Also they wanted clarification on whether I was canceling the oxycodone which I clarified that I was verbally.  I sent over a new prescription for Suboxone with additional clarifications on the note to pharmacy using electronic prescribing.  This was done with the patient witnessing the pharmacy phone call and I and apologized for the struggles were having with getting his opioid dependence treated his pain managed during this transition from acute pain management to long-term pain management.  The Suboxone is primarily for pain management in his case although he does have a remote history of opioid use disorder he was at low risk for relapsing to use prior to having severe pneumonia and being hospitalized for it. Follow up already planned to dose adjust I n8 days so sent 8 days of 8 mg film daily.     Subjective:   Carl Le is a 41 y.o. male with past medical history including: Past Medical History:  Diagnosis Date   Allergy     Anxiety disorder 09/21/2022   Hydroxyzine failing Valium helped in hospital Being sure of breath has been really scary and makes him breathe harder and makes him feel sick with concern for respiratory failure so I agreed to additional Valium but warned him about the risk of addiction   Current smoker    Depression    Herpes zoster    Kidney calculus    Mild emphysema (HCC) 08/30/2022   noted on ct scan   Pneumonia 2023   Tobacco dependence 09/21/2022     Listed as currently taking:  Current Outpatient Medications:    acetaminophen (TYLENOL) 325 MG tablet, Take 2 tablets (650 mg total) by mouth every 6 (six) hours as needed for mild pain (or Fever >/= 101)., Disp: 20 tablet, Rfl: 0   albuterol (VENTOLIN HFA) 108 (90 Base) MCG/ACT inhaler, Inhale 2 puffs into the lungs every 6 (six) hours as needed for wheezing or shortness of breath., Disp: 6.7 g, Rfl: 2   buPROPion (WELLBUTRIN XL) 150 MG 24 hr tablet, Take 1 tablet (150 mg total) by mouth daily., Disp: 90 tablet, Rfl: 3   dextromethorphan-guaiFENesin (TUSSIN DM) 10-100 MG/5ML liquid, Take 5 mLs by mouth every 4 (four) hours as needed for cough., Disp: 200 mL, Rfl: 1   diazepam (VALIUM) 2 MG tablet, Take 1 tablet (2 mg total) by mouth every 6 (six) hours as needed  for anxiety (do not mix with oxycodone)., Disp: 30 tablet, Rfl: 0   diazepam (VALIUM) 2 MG tablet, Take 1 tablet (2 mg total) by mouth every 6 (six) hours as needed for anxiety (do not mix with oxycodone)., Disp: 30 tablet, Rfl: 0   hydrOXYzine (ATARAX) 50 MG tablet, Take 1 tablet (50 mg total) by mouth in the morning and at bedtime., Disp: 180 tablet, Rfl: 3   ibuprofen (ADVIL) 800 MG tablet, Take 1 tablet (800 mg total) by mouth every 8 (eight) hours as needed., Disp: 90 tablet, Rfl: 3   nicotine (NICODERM CQ - DOSED IN MG/24 HOURS) 21 mg/24hr patch, Place 1 patch (21 mg total) onto the skin daily., Disp: 28 patch, Rfl: 0   nicotine polacrilex (NICORETTE) 4 MG gum, Take 1 each (4  mg total) by mouth as needed for smoking cessation., Disp: 100 each, Rfl: 11   nicotine polacrilex (NICOTINE MINI) 4 MG lozenge, Take 1 lozenge (4 mg total) by mouth as needed for smoking cessation., Disp: 100 tablet, Rfl: 11   oxymetazoline (AFRIN) 0.05 % nasal spray, Place 1 spray into both nostrils daily as needed for congestion., Disp: , Rfl:    senna-docusate (SENOKOT-S) 8.6-50 MG tablet, Take 1 tablet by mouth at bedtime as needed for mild constipation., Disp: 30 tablet, Rfl: 0   sertraline (ZOLOFT) 50 MG tablet, Take 1 tablet (50 mg total) by mouth daily. Do not sudden stop Start at half tablet first 2 weeks, Disp: 90 tablet, Rfl: 3   buprenorphine-naloxone (SUBOXONE) 8-2 mg SUBL SL tablet, Place 1 tablet under the tongue daily., Disp: 8 tablet, Rfl: 0   naloxone (NARCAN) nasal spray 4 mg/0.1 mL, Place 1 spray into the nose once for 1 dose. Use for over sedation for anyone suspected of overdose., Disp: 1 g, Rfl: 0    He presented today reporting reason for visit as: Chief Complaint  Patient presents with   Medication Problem    Having difficulty getting SUBOXONE      Patient reports he couldn't get suboxone or oxycodone and has uncontrolled pain and is starting to go into opioid withdrawal       Problem-specific physical exam findings: appears uncomfortable, grumpy, by phone- but he remains very polite    Today's Telemedicine visit was conducted via Video for 83m 05s after consent for telemedicine was obtained:  Video connection was never lost. Location of the Healthcare Provider:   Nature conservation officer at [facility]  [address] [place], [place] [address] Location of the Patient / Client: [address] [place] [address]  Video Call Participants - all identities confirmed visually and verbally: Healthcare Provider:  Lula Olszewski, MD  Patient / Client: Carl Le  Patient support:  None   Lula Olszewski, MD

## 2022-10-10 NOTE — Telephone Encounter (Signed)
Patient states: -Following vv with PCP on 12/05, pt went to pharmacy to pick up suboxone  - Pharmacist informed pt he can't fill rx due to PCP informing pharmacist verbally that rx was film instead of a tablet    I spoke with pharmacist who confirmed he can't fill the rx for this reason. I informed pharmacist that the tablet form of medication has been sent in to pharmacy about eight minutes prior to patient's call to PCP office. Pharmacist verbalized understanding but still can't fill this.   Patient requests: -PCP call pharmacist to clear up any confusion so he can get him medication

## 2022-10-12 ENCOUNTER — Telehealth: Payer: Self-pay | Admitting: Internal Medicine

## 2022-10-12 NOTE — Telephone Encounter (Signed)
[facility] wants to know if a prior Berkley Harvey was received for this patients medications on 10/05/22- Please call [phone] ref [id number] should you need further assistance.

## 2022-10-18 ENCOUNTER — Ambulatory Visit: Payer: BLUE CROSS/BLUE SHIELD | Admitting: Internal Medicine

## 2022-10-18 ENCOUNTER — Encounter: Payer: Self-pay | Admitting: Internal Medicine

## 2022-10-18 VITALS — BP 122/80 | HR 90 | Temp 98.3°F | Resp 12 | Ht 72.0 in | Wt 180.2 lb

## 2022-10-18 DIAGNOSIS — J918 Pleural effusion in other conditions classified elsewhere: Secondary | ICD-10-CM

## 2022-10-18 DIAGNOSIS — F1129 Opioid dependence with unspecified opioid-induced disorder: Secondary | ICD-10-CM

## 2022-10-18 DIAGNOSIS — Z0271 Encounter for disability determination: Secondary | ICD-10-CM

## 2022-10-18 DIAGNOSIS — F17218 Nicotine dependence, cigarettes, with other nicotine-induced disorders: Secondary | ICD-10-CM

## 2022-10-18 DIAGNOSIS — F1120 Opioid dependence, uncomplicated: Secondary | ICD-10-CM | POA: Insufficient documentation

## 2022-10-18 DIAGNOSIS — J189 Pneumonia, unspecified organism: Secondary | ICD-10-CM

## 2022-10-18 MED ORDER — NICOTINE 21 MG/24HR TD PT24: 21.0000 mg | MEDICATED_PATCH | Freq: Every day | TRANSDERMAL | 0 refills | Status: DC

## 2022-10-18 MED ORDER — BUPRENORPHINE HCL-NALOXONE HCL 8-2 MG SL SUBL: 1.5000 | SUBLINGUAL_TABLET | Freq: Every day | SUBLINGUAL | 0 refills | Status: DC

## 2022-10-18 MED ORDER — NICOTINE 10 MG IN INHA: 1.0000 | RESPIRATORY_TRACT | 0 refills | Status: DC | PRN

## 2022-10-18 NOTE — Telephone Encounter (Signed)
This has already been resolved.

## 2022-10-18 NOTE — Assessment & Plan Note (Signed)
Supporting the stability but he feels he is ready to return to work now so I felt a form out that he is okay to return to work with no restrictions but insisted that he hurry up and get the follow-up x-ray because he is still coughing up goop and I am concerned that he may not have fully resolved his pneumonia the x-ray shows anything concerning I am going to send him over to lung specialist he is not following with 1 yet

## 2022-10-18 NOTE — Assessment & Plan Note (Signed)
Hard to quit working but has had some relapses I offered to help him with any medications that he is interested in he is still taking patches and gum he is more patches Continue with patches and Wellbutrin Will do the inhaler

## 2022-10-19 NOTE — Progress Notes (Signed)
[facility]: [phone]   Routine Medical Office Visit  Patient:  Carl Le      Age: 41 y.o.       Sex:  male  Date:   10/19/2022  PCP:    Lula Olszewski, MD   Today's Healthcare Provider: Lula Olszewski, MD  Assessment/Plan:     Yazeed was seen today for 1 month follow-up.  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 Has short term disability paperwork being done by another doc    Assessment & Plan: Supported his disability during severe pneumonia but he feels he is ready to return to work now so I felt a form out that he is okay to return to work with no restrictions but insisted that he hurry up and get the follow-up x-ray because he is still coughing up goop and I am concerned that he may not have fully resolved his pneumonia the x-ray shows anything concerning I am going to send him over to lung specialist he is not following with 1 yet   Cigarette nicotine dependence with other nicotine-induced disorder Overview: Attempting discharge 09/2022 with wellbutrin and nic gum   Assessment & Plan: Hard to quit working but has had some relapses I offered to help him with any medications that he is interested in he is still taking patches and gum he is more patches Continue with patches and Wellbutrin Will do the inhaler  Orders: -     Nicotine; Place 1 patch (21 mg total) onto the skin daily.  Dispense: 28 patch; Refill: 0 -     Nicotine; Inhale 1 Cartridge (1 continuous puffing total) into the lungs as needed for smoking cessation.  Dispense: 42 each; Refill: 0  Opioid dependence with opioid-induced disorder (HCC) Overview: History of legal issues associated with opioid use disorder. Was hospitalized due to severe pneumonia with chest pain leading to resumption of percocet that made him fear return to opioid misuse Transitioned  to suboxone rather than go through withdrawal 10/2022- probation officer concerned about the suboxone. Has been very forthcoming about this issue and concern  Assessment & Plan: He reports to me the Suboxone is working well and he is having no side effects but that it is not enough so I agreed to go up on the dose at his request.  He also told me that his probation officer is concerned that it may increase his risk of relapse and that he has remote history of opioid use disorder and that the recent illness with Percocet management had made him dependent and afraid of withdrawal on opioids again.  I advised him that Suboxone will not increase his risk of relapse and in fact reduces it because he will go through withdrawal without it and his pain from the pneumonia will be on controlled and these factors will both tend to contribute towards return to opioid misuse but he will not have these things happen if he just takes the Suboxone as prescribed.  I will do close follow up for a while also to make sure no problems and plan for long term maintenance management with suboxone with very slow taper.  Orders: -     Buprenorphine HCl-Naloxone HCl; Place 1.5 tablets under the tongue daily for 8 days.  Dispense: 12 tablet; Refill: 0 -     Drug Screen, 5 Panel, Ur  Follow up 1 week Common side effects, risks, benefits, and alternatives for medications and treatment plan prescribed today were discussed. He expressed understanding of the given instructions.  We discussed red flag symptoms and signs in detail and when to call the office or go to ER if his condition worsens (see AVS). He expressed understanding and AVS is used to reinforce. Medication list was reconciled and patient instructions and summary information was documented and made available for him to review in the AVS (see AVS).  This entire medical encounter document is also available on MyChart for him to review for accuracy and understanding.   Review of this document is encouraged in the AVS. No barriers to understanding were identified     Subjective:   Carl Le is a 41 y.o. male with the following chart data reviewed: Past Medical History:  Diagnosis Date   Allergy    Anxiety disorder 09/21/2022   Hydroxyzine failing Valium helped in hospital Being sure of breath has been really scary and makes him breathe harder and makes him feel sick with concern for respiratory failure so I agreed to additional Valium but warned him about the risk of addiction   Current smoker    Depression    Herpes zoster    Kidney calculus    Mild emphysema (HCC) 08/30/2022   noted on ct scan   Pneumonia 2023   Tobacco dependence 09/21/2022   Outpatient Medications Prior to Visit  Medication Sig   acetaminophen (TYLENOL) 325 MG tablet Take 2 tablets (650 mg total) by mouth every 6 (six) hours as needed for mild pain (or Fever >/= 101).   albuterol (VENTOLIN HFA) 108 (90 Base) MCG/ACT inhaler Inhale 2 puffs into the lungs every 6 (six) hours as needed for wheezing or shortness of breath.   buPROPion (WELLBUTRIN XL) 150 MG 24 hr tablet Take 1 tablet (150 mg total) by mouth daily.   dextromethorphan-guaiFENesin (TUSSIN DM) 10-100 MG/5ML liquid Take 5 mLs by mouth every 4 (four) hours as needed for cough.   diazepam (VALIUM) 2 MG tablet Take 1 tablet (2 mg total) by mouth every 6 (six) hours as needed for anxiety (do not mix with oxycodone).   diazepam (VALIUM) 2 MG tablet Take 1 tablet (2 mg total) by mouth every 6 (six) hours as needed for anxiety (do not mix with oxycodone).   hydrOXYzine (ATARAX) 50 MG tablet Take 1 tablet (50 mg total) by mouth in the morning and at bedtime.   ibuprofen (ADVIL) 800 MG tablet Take 1 tablet (800 mg total) by mouth every 8 (eight) hours as needed.   naloxone (NARCAN) nasal spray 4 mg/0.1 mL Place 1 spray into the nose once for 1 dose. Use for over sedation for anyone suspected of overdose.   nicotine  polacrilex (NICORETTE) 4 MG gum Take 1 each (4 mg total) by mouth as needed for smoking cessation.   nicotine polacrilex (NICOTINE MINI) 4 MG lozenge Take 1 lozenge (4 mg total) by mouth as needed for smoking cessation.   oxymetazoline (AFRIN) 0.05 % nasal spray Place 1 spray into both nostrils daily as needed for congestion.   senna-docusate (SENOKOT-S) 8.6-50 MG tablet Take 1 tablet by mouth at bedtime as needed for mild constipation.   sertraline (ZOLOFT) 50 MG tablet Take 1 tablet (50 mg total) by mouth daily. Do not sudden stop Start at half tablet first 2 weeks   [DISCONTINUED] buprenorphine-naloxone (SUBOXONE) 8-2 mg SUBL SL tablet Place 1 tablet under the tongue  daily.   [DISCONTINUED] nicotine (NICODERM CQ - DOSED IN MG/24 HOURS) 21 mg/24hr patch Place 1 patch (21 mg total) onto the skin daily.   No facility-administered medications prior to visit.     He presented today reporting reason for visit as: Chief Complaint  Patient presents with   1 month follow-up    Everything has improved.     Problem-focused charting was used to record today's medical interview as problems and problem overview medical record updates as follows: Problem  Opioid Dependence (Hcc)   History of legal issues associated with opioid use disorder. Was hospitalized due to severe pneumonia with chest pain leading to resumption of percocet that made him fear return to opioid misuse Transitioned to suboxone rather than go through withdrawal 10/2022- probation officer concerned about the suboxone. 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  Nursing notes reviewed. General Appearance/Constitutional:  Well developed, well nourished male in no acute distress Musculoskeletal: All extremities are intact.  Neurological:  Awake, alert,  No obvious focal neurological deficits or cognitive impairments Psychiatric:  Appropriate mood, pleasant demeanor Problem-specific findings:  relaxed appearing, no coughing or shortness of breath, does not appear intoxicated or drowsy in any way.    Results Reviewed:  No results found for any visits on 10/18/22.   Recent Results (from the past 2160 hour(s))  Pleural fluid cell count with differential     Status: Abnormal   Collection Time: 08/30/22 12:35 AM  Result Value Ref Range   Fluid Type-FCT PLEURAL    Color, Fluid AMBER (A) YELLOW   Appearance, Fluid CLOUDY (A) CLEAR   Total Nucleated Cell Count, Fluid 36,950 cu mm   Neutrophil Count, Fluid 96 %   Lymphs, Fluid 2 %   Monocyte-Macrophage-Serous Fluid 1 %   Eos, Fluid 1 %   Other Cells, Fluid RBCS %    Comment: Performed at [facility], [address] [place], [place] [address]  Glucose, pleural fluid     Status: None   Collection Time: 08/30/22 12:35 AM  Result Value Ref Range   Glucose, Fluid 78 mg/dL    Comment: (NOTE) No normal range established for this test Results should be evaluated in conjunction with serum values    Fluid Type-FGLU PLEURAL     Comment: Performed at [facility], [address] [place], [place] [address]  Protein, pleural fluid     Status: None   Collection Time: 08/30/22 12:35 AM  Result Value Ref Range   Total protein, fluid 5.6 g/dL    Comment: (NOTE) No normal range established for this test Results should be evaluated in conjunction with serum values    Fluid Type-FTP PLEURAL     Comment: Performed at [facility],  [address], [place], [place] [address]  Lactate dehydrogenase, pleural fluid  Status: Abnormal   Collection Time: 08/30/22 12:35 AM  Result Value Ref Range   LD, Fluid 938 (H) 3 - 23 U/L    Comment: (NOTE) Results should be evaluated in conjunction with serum values    Fluid Type-FLDH PLEURAL     Comment: Performed at [facility], [address] [place], [place] [address]  Triglycerides, Pleural Fluid     Status: None   Collection Time: 08/30/22 12:35 AM  Result Value Ref Range   Triglycerides, Fluid 49 Not Estab. mg/dL    Comment: (NOTE) The reference interval(s) and other method performance specifications have not been established for this body fluid. The test result must be integrated into the clinical context for interpretation. Performed At: [facility] [address] [place], [place] [id number] Jolene Schimke MD [id number]    Fluid Type-FTRIG PLEURAL     Comment: Performed at [facility], [address] [place], [place] [address]  Cholesterol, pleural fluid     Status: None   Collection Time: 08/30/22 12:35 AM  Result Value Ref Range   Cholesterol, Fluid 93 mg/dL    Comment: (NOTE) INTERPRETIVE INFORMATION: Cholesterol, Body Fluid For information on body fluid reference ranges and/or interpretive guidance visit [email or web address] This test was developed and its performance characteristics determined by [employer]. It has not been cleared or approved by the [place] Food and Drug Administration. This test was performed in a CLIA certified laboratory and is intended for clinical purposes. Performed At: [facility] [address] [place], [place] [id number] Evans Lance MDPhD [id number]    Chol, Fluid Type PLEURAL     Comment: Performed at [facility], [address] [place], [place] [address]  Pathologist smear review     Status: None   Collection Time: 08/30/22 12:35 AM   Result Value Ref Range   Path Review Cytospin of pleural fluid is reviewed.     Comment: Patient with lobar pneumonia and parapneumonic pleural effusion. Abundant acute inflammation. Negative for malignancy. Findings consistent with clinical impressino of pneumonia. Reviewed by Georgiann Cocker Oneita Kras, M.D. Performed at [facility], [address] [place], [place] [address]   CBC with Differential     Status: Abnormal   Collection Time: 08/30/22  4:16 PM  Result Value Ref Range   WBC 25.5 (H) 4.0 - 10.5 K/uL   RBC 5.46 4.22 - 5.81 MIL/uL   Hemoglobin 16.2 13.0 - 17.0 g/dL   HCT 54.0 08.6 - 76.1 %   MCV 87.4 80.0 - 100.0 fL   MCH 29.7 26.0 - 34.0 pg   MCHC 34.0 30.0 - 36.0 g/dL   RDW 95.0 93.2 - 67.1 %   Platelets 362 150 - 400 K/uL   nRBC 0.0 0.0 - 0.2 %   Neutrophils Relative % 90 %   Neutro Abs 23.0 (H) 1.7 - 7.7 K/uL   Lymphocytes Relative 3 %   Lymphs Abs 0.8 0.7 - 4.0 K/uL   Monocytes Relative 6 %   Monocytes Absolute 1.4 (H) 0.1 - 1.0 K/uL   Eosinophils Relative 0 %   Eosinophils Absolute 0.0 0.0 - 0.5 K/uL   Basophils Relative 0 %   Basophils Absolute 0.1 0.0 - 0.1 K/uL   WBC Morphology MORPHOLOGY UNREMARKABLE    RBC Morphology MORPHOLOGY UNREMARKABLE    Smear Review Normal platelet morphology    Immature Granulocytes 1 %   Abs Immature Granulocytes 0.20 (H) 0.00 - 0.07 K/uL    Comment: Performed  at [facility], [address] [place] [address] [place], [place] [address]  Comprehensive metabolic panel     Status: Abnormal   Collection Time: 08/30/22  4:16 PM  Result Value Ref Range   Sodium 135 135 - 145 mmol/L   Potassium 4.7 3.5 - 5.1 mmol/L   Chloride 103 98 - 111 mmol/L   CO2 20 (L) 22 - 32 mmol/L   Glucose, Bld 150 (H) 70 - 99 mg/dL    Comment: Glucose reference range applies only to samples taken after fasting for at least 8 hours.   BUN 13 6 - 20 mg/dL   Creatinine, Ser 6.04 0.61 - 1.24 mg/dL   Calcium 9.5 8.9 - 54.0 mg/dL   Total Protein 8.6 (H) 6.5 -  8.1 g/dL   Albumin 4.3 3.5 - 5.0 g/dL   AST 38 15 - 41 U/L   ALT 82 (H) 0 - 44 U/L   Alkaline Phosphatase 104 38 - 126 U/L   Total Bilirubin 1.9 (H) 0.3 - 1.2 mg/dL   GFR, Estimated >98 >11 mL/min    Comment: (NOTE) Calculated using the CKD-EPI Creatinine Equation (2021)    Anion gap 12 5 - 15    Comment: Performed at [facility], [address], [place], [place] [address]  Troponin I (High Sensitivity)     Status: None   Collection Time: 08/30/22  4:16 PM  Result Value Ref Range   Troponin I (High Sensitivity) 4 <18 ng/L    Comment: (NOTE) Elevated high sensitivity troponin I (hsTnI) values and significant  changes across serial measurements may suggest ACS but many other  chronic and acute conditions are known to elevate hsTnI results.  Refer to the "Links" section for chest pain algorithms and additional  guidance. Performed at [facility], [address] [place], [place] [address]   Culture, blood (routine x 2)     Status: None   Collection Time: 08/30/22  4:16 PM   Specimen: BLOOD RIGHT HAND  Result Value Ref Range   Specimen Description BLOOD RIGHT HAND    Special Requests      BOTTLES DRAWN AEROBIC AND ANAEROBIC Blood Culture adequate volume   Culture      NO GROWTH 5 DAYS Performed at [facility], [address] [place], [place] [address]    Report Status 09/04/2022 FINAL   Culture, blood (routine x 2)     Status: None   Collection Time: 08/30/22  4:16 PM   Specimen: Right Antecubital; Blood  Result Value Ref Range   Specimen Description RIGHT ANTECUBITAL    Special Requests      BOTTLES DRAWN AEROBIC AND ANAEROBIC Blood Culture results may not be optimal due to an excessive volume of blood received in culture bottles   Culture      NO GROWTH 5 DAYS Performed at [facility], [address] [place], [place] [address]    Report Status 09/04/2022 FINAL   Lactic acid, plasma     Status: Abnormal   Collection Time:  08/30/22  4:16 PM  Result Value Ref Range   Lactic Acid, Venous 2.0 (HH) 0.5 - 1.9 mmol/L    Comment: CRITICAL RESULT CALLED TO, READ BACK BY AND VERIFIED WITH ERICKA MEDLIN 08/30/22 1649 KLW Performed at [facility], [address] [place], [place] [address]   SARS Coronavirus 2 by RT PCR (hospital order, performed in [facility] hospital lab) *cepheid single result test* Anterior Nasal Swab  Status: None   Collection Time: 08/30/22  4:55 PM   Specimen: Anterior Nasal Swab  Result Value Ref Range   SARS Coronavirus 2 by RT PCR NEGATIVE NEGATIVE    Comment: (NOTE) SARS-CoV-2 target nucleic acids are NOT DETECTED.  The SARS-CoV-2 RNA is generally detectable in upper and lower respiratory specimens during the acute phase of infection. The lowest concentration of SARS-CoV-2 viral copies this assay can detect is 250 copies / mL. A negative result does not preclude SARS-CoV-2 infection and should not be used as the sole basis for treatment or other patient management decisions.  A negative result may occur with improper specimen collection / handling, submission of specimen other than nasopharyngeal swab, presence of viral mutation(s) within the areas targeted by this assay, and inadequate number of viral copies (<250 copies / mL). A negative result must be combined with clinical observations, patient history, and epidemiological information.  Fact Sheet for Patients:   RoadLapTop.co.za  Fact Sheet for Healthcare Providers: http://kim-miller.com/  This test is not yet approved or  cleared by the [place] FDA and has been authorized for detection and/or diagnosis of SARS-CoV-2 by FDA under an Emergency Use Authorization (EUA).  This EUA will remain in effect (meaning this test can be used) for the duration of the COVID-19 declaration under Section 564(b)(1) of the Act, 21 U.S.C. section 360bbb-3(b)(1), unless the  authorization is terminated or revoked sooner.  Performed at [facility], [address] [place], [place] [address]   Lactic acid, plasma     Status: Abnormal   Collection Time: 08/30/22  6:29 PM  Result Value Ref Range   Lactic Acid, Venous 2.2 (HH) 0.5 - 1.9 mmol/L    Comment: CRITICAL VALUE NOTED. VALUE IS CONSISTENT WITH PREVIOUSLY REPORTED/CALLED VALUE SKL Performed at [facility], [address] [place], [place] [address]   Troponin I (High Sensitivity)     Status: None   Collection Time: 08/30/22  6:29 PM  Result Value Ref Range   Troponin I (High Sensitivity) 3 <18 ng/L    Comment: (NOTE) Elevated high sensitivity troponin I (hsTnI) values and significant  changes across serial measurements may suggest ACS but many other  chronic and acute conditions are known to elevate hsTnI results.  Refer to the "Links" section for chest pain algorithms and additional  guidance. Performed at [facility], [address] [place], [place] [address]   Blood gas, arterial     Status: Abnormal   Collection Time: 08/30/22  9:31 PM  Result Value Ref Range   O2 Content 3.0 L/min   Delivery systems NASAL CANNULA    pH, Arterial 7.43 7.35 - 7.45   pCO2 arterial 36 32 - 48 mmHg   pO2, Arterial 69 (L) 83 - 108 mmHg   Bicarbonate 23.9 20.0 - 28.0 mmol/L   Acid-base deficit 0.1 0.0 - 2.0 mmol/L   O2 Saturation 97.3 %   Patient temperature 37.0    Collection site RIGHT RADIAL    Allens test (pass/fail) PASS PASS    Comment: Performed at [facility], [address] [place], [place] [address]  MRSA Next Gen by PCR, Nasal     Status: None   Collection Time: 08/31/22 12:15 AM   Specimen: Nasal Mucosa; Nasal Swab  Result Value Ref Range   MRSA by PCR Next Gen NOT DETECTED NOT DETECTED    Comment: (NOTE) The GeneXpert MRSA Assay (FDA approved for NASAL specimens only), is one component of a  comprehensive MRSA colonization surveillance program. It  is not intended to diagnose MRSA infection nor to guide or monitor treatment for MRSA infections. Test performance is not FDA approved in patients less than 31 years old. Performed at [facility], [address] [place], [place] [address]   Albumin, Pleural Fluid     Status: None   Collection Time: 08/31/22 12:15 AM  Result Value Ref Range   Albumin 3.5 3.5 - 5.0 g/dL    Comment: Performed at [facility], [address] [place], [place] [address]  Lactic acid, plasma     Status: None   Collection Time: 08/31/22 12:15 AM  Result Value Ref Range   Lactic Acid, Venous 1.5 0.5 - 1.9 mmol/L    Comment: Performed at [facility], [address] [place], [place] [address]  HIV Antibody (routine testing w rflx)     Status: None   Collection Time: 08/31/22 12:15 AM  Result Value Ref Range   HIV Screen 4th Generation wRfx Non Reactive Non Reactive    Comment: Performed at [facility], [address], [place], [place] [address]  Pleural fluid culture w Gram Stain     Status: None   Collection Time: 08/31/22 12:35 AM   Specimen: Pleural Fluid  Result Value Ref Range   Specimen Description      PLEURAL Performed at [facility], [address] [place], [place] [address]    Special Requests      NONE Performed at [facility], [address] [place], [place] [address]    Gram Stain      MODERATE WBC PRESENT, PREDOMINANTLY PMN NO ORGANISMS SEEN    Culture      NO GROWTH 3 DAYS Performed at [facility], [address] [place], [place] [address]    Report Status 09/03/2022 FINAL   CBG monitoring, ED     Status: Abnormal   Collection Time: 08/31/22  2:18 AM  Result Value Ref Range   Glucose-Capillary 125 (H) 70 - 99 mg/dL    Comment: Glucose reference range applies only to samples taken after fasting for at least 8 hours.  Strep pneumoniae urinary antigen     Status: None   Collection Time: 08/31/22  2:20 AM   Result Value Ref Range   Strep Pneumo Urinary Antigen NEGATIVE NEGATIVE    Comment:        Infection due to S. pneumoniae cannot be absolutely ruled out since the antigen present may be below the detection limit of the test. Performed at [facility], [address], [place], [place] [address]   Legionella Pneumophila Serogp 1 Ur Ag     Status: None   Collection Time: 08/31/22  2:20 AM  Result Value Ref Range   L. pneumophila Serogp 1 Ur Ag Negative Negative    Comment: (NOTE) Presumptive negative for L. pneumophila serogroup 1 antigen in urine, suggesting no recent or current infection. Legionnaires' disease cannot be ruled out since other serogroups and species may also cause disease. Performed At: [facility] [address] [place], [place] [id number] Jolene Schimke MD NU:[id number]    Source of Sample URINE, RANDOM     Comment: Performed at [facility], [address] [place], [place] [address]  Urinalysis, Routine w reflex microscopic     Status: Abnormal   Collection Time: 08/31/22  2:20 AM  Result Value Ref Range   Color, Urine YELLOW (A) YELLOW  APPearance CLEAR (A) CLEAR   Specific Gravity, Urine 1.032 (H) 1.005 - 1.030   pH 5.0 5.0 - 8.0   Glucose, UA NEGATIVE NEGATIVE mg/dL   Hgb urine dipstick NEGATIVE NEGATIVE   Bilirubin Urine NEGATIVE NEGATIVE   Ketones, ur NEGATIVE NEGATIVE mg/dL   Protein, ur NEGATIVE NEGATIVE mg/dL   Nitrite NEGATIVE NEGATIVE   Leukocytes,Ua NEGATIVE NEGATIVE    Comment: Performed at [facility], [address] [place], [place] [address]  CBC with Differential/Platelet     Status: Abnormal   Collection Time: 08/31/22  2:55 AM  Result Value Ref Range   WBC 22.1 (H) 4.0 - 10.5 K/uL   RBC 4.78 4.22 - 5.81 MIL/uL   Hemoglobin 14.0 13.0 - 17.0 g/dL   HCT 11.9 14.7 - 82.9 %   MCV 87.7 80.0 - 100.0 fL   MCH 29.3 26.0 - 34.0 pg   MCHC 33.4 30.0 - 36.0 g/dL   RDW 56.2 13.0 - 86.5 %   Platelets 288  150 - 400 K/uL   nRBC 0.0 0.0 - 0.2 %   Neutrophils Relative % 87 %   Neutro Abs 19.1 (H) 1.7 - 7.7 K/uL   Lymphocytes Relative 4 %   Lymphs Abs 0.9 0.7 - 4.0 K/uL   Monocytes Relative 8 %   Monocytes Absolute 1.8 (H) 0.1 - 1.0 K/uL   Eosinophils Relative 0 %   Eosinophils Absolute 0.0 0.0 - 0.5 K/uL   Basophils Relative 0 %   Basophils Absolute 0.1 0.0 - 0.1 K/uL   Immature Granulocytes 1 %   Abs Immature Granulocytes 0.18 (H) 0.00 - 0.07 K/uL    Comment: Performed at [facility], [address] [place], [place] [address]  Comprehensive metabolic panel     Status: Abnormal   Collection Time: 08/31/22  2:55 AM  Result Value Ref Range   Sodium 135 135 - 145 mmol/L   Potassium 3.7 3.5 - 5.1 mmol/L   Chloride 106 98 - 111 mmol/L   CO2 21 (L) 22 - 32 mmol/L   Glucose, Bld 116 (H) 70 - 99 mg/dL    Comment: Glucose reference range applies only to samples taken after fasting for at least 8 hours.   BUN 15 6 - 20 mg/dL   Creatinine, Ser 6.29 0.61 - 1.24 mg/dL   Calcium 7.8 (L) 8.9 - 10.3 mg/dL   Total Protein 6.1 (L) 6.5 - 8.1 g/dL   Albumin 3.1 (L) 3.5 - 5.0 g/dL   AST 21 15 - 41 U/L   ALT 46 (H) 0 - 44 U/L   Alkaline Phosphatase 70 38 - 126 U/L   Total Bilirubin 2.0 (H) 0.3 - 1.2 mg/dL   GFR, Estimated >52 >84 mL/min    Comment: (NOTE) Calculated using the CKD-EPI Creatinine Equation (2021)    Anion gap 8 5 - 15    Comment: Performed at [facility], [address] [place], [place] [address]  Hemoglobin A1c     Status: Abnormal   Collection Time: 08/31/22  2:55 AM  Result Value Ref Range   Hgb A1c MFr Bld 4.7 (L) 4.8 - 5.6 %    Comment: (NOTE) Pre diabetes:          5.7%-6.4%  Diabetes:              >6.4%  Glycemic control for   <7.0% adults with diabetes    Mean Plasma Glucose 88.19 mg/dL    Comment: Performed at [facility], [address] [place], [place] [address]  Lactic acid, plasma     Status: None   Collection Time: 08/31/22  2:55  AM  Result Value Ref Range   Lactic Acid, Venous 1.1 0.5 - 1.9 mmol/L    Comment: Performed at [facility], [address] [place], [place] [address]  Magnesium     Status: Abnormal   Collection Time: 08/31/22  2:55 AM  Result Value Ref Range   Magnesium 1.4 (L) 1.7 - 2.4 mg/dL    Comment: Performed at [facility], [address] [place], [place] [address]  Phosphorus     Status: Abnormal   Collection Time: 08/31/22  2:55 AM  Result Value Ref Range   Phosphorus 2.4 (L) 2.5 - 4.6 mg/dL    Comment: Performed at [facility], [address] [place], [place] [address]  Procalcitonin - Baseline     Status: None   Collection Time: 08/31/22  2:55 AM  Result Value Ref Range   Procalcitonin 2.15 ng/mL    Comment:        Interpretation: PCT > 2 ng/mL: Systemic infection (sepsis) is likely, unless other causes are known. (NOTE)       Sepsis PCT Algorithm           Lower Respiratory Tract                                      Infection PCT Algorithm    ----------------------------     ----------------------------         PCT < 0.25 ng/mL                PCT < 0.10 ng/mL          Strongly encourage             Strongly discourage   discontinuation of antibiotics    initiation of antibiotics    ----------------------------     -----------------------------       PCT 0.25 - 0.50 ng/mL            PCT 0.10 - 0.25 ng/mL               OR       >80% decrease in PCT            Discourage initiation of                                            antibiotics      Encourage discontinuation           of antibiotics    ----------------------------     -----------------------------         PCT >= 0.50 ng/mL              PCT 0.26 - 0.50 ng/mL               AND       <80% decrease in PCT              Encourage initiation of  antibiotics       Encourage continuation           of antibiotics    ----------------------------      -----------------------------        PCT >= 0.50 ng/mL                  PCT > 0.50 ng/mL               AND         increase in PCT                  Strongly encourage                                      initiation of antibiotics    Strongly encourage escalation           of antibiotics                                     -----------------------------                                           PCT <= 0.25 ng/mL                                                 OR                                        > 80% decrease in PCT                                      Discontinue / Do not initiate                                             antibiotics  Performed at [facility], [address] [place], [place] [address]   CBG monitoring, ED     Status: Abnormal   Collection Time: 08/31/22  4:41 AM  Result Value Ref Range   Glucose-Capillary 122 (H) 70 - 99 mg/dL    Comment: Glucose reference range applies only to samples taken after fasting for at least 8 hours.   Comment 1 Notify RN    Comment 2 Document in Chart   Glucose, capillary     Status: Abnormal   Collection Time: 08/31/22  5:54 AM  Result Value Ref Range   Glucose-Capillary 120 (H) 70 - 99 mg/dL    Comment: Glucose reference range applies only to samples taken after fasting for at least 8 hours.  Glucose, capillary     Status: Abnormal   Collection Time: 08/31/22  7:42 AM  Result Value Ref Range   Glucose-Capillary 118 (H) 70 - 99 mg/dL    Comment: Glucose reference range applies only to  samples taken after fasting for at least 8 hours.  Glucose, capillary     Status: Abnormal   Collection Time: 08/31/22 11:22 AM  Result Value Ref Range   Glucose-Capillary 135 (H) 70 - 99 mg/dL    Comment: Glucose reference range applies only to samples taken after fasting for at least 8 hours.   Comment 1 Notify RN   Glucose, capillary     Status: Abnormal   Collection Time: 08/31/22  4:09 PM  Result Value Ref Range    Glucose-Capillary 112 (H) 70 - 99 mg/dL    Comment: Glucose reference range applies only to samples taken after fasting for at least 8 hours.  Glucose, capillary     Status: Abnormal   Collection Time: 08/31/22  7:22 PM  Result Value Ref Range   Glucose-Capillary 123 (H) 70 - 99 mg/dL    Comment: Glucose reference range applies only to samples taken after fasting for at least 8 hours.  Glucose, capillary     Status: Abnormal   Collection Time: 08/31/22 11:07 PM  Result Value Ref Range   Glucose-Capillary 144 (H) 70 - 99 mg/dL    Comment: Glucose reference range applies only to samples taken after fasting for at least 8 hours.  Glucose, capillary     Status: Abnormal   Collection Time: 09/01/22  3:30 AM  Result Value Ref Range   Glucose-Capillary 116 (H) 70 - 99 mg/dL    Comment: Glucose reference range applies only to samples taken after fasting for at least 8 hours.  CBC     Status: Abnormal   Collection Time: 09/01/22  5:10 AM  Result Value Ref Range   WBC 24.3 (H) 4.0 - 10.5 K/uL   RBC 4.75 4.22 - 5.81 MIL/uL   Hemoglobin 13.9 13.0 - 17.0 g/dL   HCT 79.8 92.1 - 19.4 %   MCV 86.5 80.0 - 100.0 fL   MCH 29.3 26.0 - 34.0 pg   MCHC 33.8 30.0 - 36.0 g/dL   RDW 17.4 08.1 - 44.8 %   Platelets 277 150 - 400 K/uL   nRBC 0.0 0.0 - 0.2 %    Comment: Performed at [facility], [address], [place], [place] [address]  Basic metabolic panel     Status: Abnormal   Collection Time: 09/01/22  5:10 AM  Result Value Ref Range   Sodium 130 (L) 135 - 145 mmol/L   Potassium 4.1 3.5 - 5.1 mmol/L   Chloride 98 98 - 111 mmol/L   CO2 23 22 - 32 mmol/L   Glucose, Bld 159 (H) 70 - 99 mg/dL    Comment: Glucose reference range applies only to samples taken after fasting for at least 8 hours.   BUN 15 6 - 20 mg/dL   Creatinine, Ser 1.49 0.61 - 1.24 mg/dL   Calcium 8.4 (L) 8.9 - 10.3 mg/dL   GFR, Estimated >70 >26 mL/min    Comment: (NOTE) Calculated using the CKD-EPI Creatinine Equation  (2021)    Anion gap 9 5 - 15    Comment: Performed at [facility], [address] [place], [place] [address]  Magnesium     Status: None   Collection Time: 09/01/22  5:10 AM  Result Value Ref Range   Magnesium 2.0 1.7 - 2.4 mg/dL    Comment: Performed at [facility], [address], [place], [place] [address]  Phosphorus     Status: Abnormal   Collection Time: 09/01/22  5:10 AM  Result Value Ref Range   Phosphorus 2.4 (L) 2.5 - 4.6 mg/dL    Comment: Performed at [facility], [address] [place], [place] [id number]  Glucose, capillary     Status: Abnormal   Collection Time: 09/01/22  7:37 AM  Result Value Ref Range   Glucose-Capillary 110 (H) 70 - 99 mg/dL    Comment: Glucose reference range applies only to samples taken after fasting for at least 8 hours.  Glucose, capillary     Status: Abnormal   Collection Time: 09/01/22 11:12 AM  Result Value Ref Range   Glucose-Capillary 111 (H) 70 - 99 mg/dL    Comment: Glucose reference range applies only to samples taken after fasting for at least 8 hours.  Glucose, capillary     Status: Abnormal   Collection Time: 09/01/22  7:27 PM  Result Value Ref Range   Glucose-Capillary 115 (H) 70 - 99 mg/dL    Comment: Glucose reference range applies only to samples taken after fasting for at least 8 hours.  Glucose, capillary     Status: Abnormal   Collection Time: 09/02/22 12:07 AM  Result Value Ref Range   Glucose-Capillary 145 (H) 70 - 99 mg/dL    Comment: Glucose reference range applies only to samples taken after fasting for at least 8 hours.  Glucose, capillary     Status: Abnormal   Collection Time: 09/02/22  3:46 AM  Result Value Ref Range   Glucose-Capillary 150 (H) 70 - 99 mg/dL    Comment: Glucose reference range applies only to samples taken after fasting for at least 8 hours.  Glucose, capillary     Status: None   Collection Time: 09/02/22  7:31 AM  Result Value Ref Range   Glucose-Capillary 97  70 - 99 mg/dL    Comment: Glucose reference range applies only to samples taken after fasting for at least 8 hours.  Glucose, capillary     Status: Abnormal   Collection Time: 09/02/22 11:17 AM  Result Value Ref Range   Glucose-Capillary 139 (H) 70 - 99 mg/dL    Comment: Glucose reference range applies only to samples taken after fasting for at least 8 hours.  Glucose, capillary     Status: Abnormal   Collection Time: 09/02/22  3:26 PM  Result Value Ref Range   Glucose-Capillary 151 (H) 70 - 99 mg/dL    Comment: Glucose reference range applies only to samples taken after fasting for at least 8 hours.  Glucose, capillary     Status: Abnormal   Collection Time: 09/02/22  7:10 PM  Result Value Ref Range   Glucose-Capillary 149 (H) 70 - 99 mg/dL    Comment: Glucose reference range applies only to samples taken after fasting for at least 8 hours.  Comprehensive metabolic panel     Status: Abnormal   Collection Time: 09/03/22  1:06 AM  Result Value Ref Range   Sodium 133 (L) 135 - 145 mmol/L   Potassium 4.1 3.5 - 5.1 mmol/L   Chloride 101 98 - 111 mmol/L   CO2 24 22 - 32 mmol/L   Glucose, Bld 131 (H) 70 - 99 mg/dL    Comment: Glucose reference range applies only to samples taken after fasting for at least 8 hours.   BUN 10 6 - 20 mg/dL   Creatinine, Ser 6.040.72 0.61 - 1.24 mg/dL   Calcium 8.0 (L) 8.9 - 10.3 mg/dL   Total Protein 5.4 (L) 6.5 - 8.1 g/dL   Albumin  2.1 (L) 3.5 - 5.0 g/dL   AST 38 15 - 41 U/L   ALT 49 (H) 0 - 44 U/L   Alkaline Phosphatase 80 38 - 126 U/L   Total Bilirubin 0.4 0.3 - 1.2 mg/dL   GFR, Estimated >32 >35 mL/min    Comment: (NOTE) Calculated using the CKD-EPI Creatinine Equation (2021)    Anion gap 8 5 - 15    Comment: Performed at [facility], [address] [place], [place] [address]  CBC     Status: Abnormal   Collection Time: 09/03/22  1:06 AM  Result Value Ref Range   WBC 14.6 (H) 4.0 - 10.5 K/uL   RBC 4.08 (L) 4.22 - 5.81 MIL/uL   Hemoglobin  12.7 (L) 13.0 - 17.0 g/dL   HCT 02.5 (L) 42.7 - 06.2 %   MCV 86.5 80.0 - 100.0 fL   MCH 31.1 26.0 - 34.0 pg   MCHC 36.0 30.0 - 36.0 g/dL   RDW 37.6 28.3 - 15.1 %   Platelets 273 150 - 400 K/uL   nRBC 0.0 0.0 - 0.2 %    Comment: Performed at [facility], [address] [place], [place] [address]  CBC     Status: Abnormal   Collection Time: 09/04/22  1:17 AM  Result Value Ref Range   WBC 12.1 (H) 4.0 - 10.5 K/uL   RBC 4.08 (L) 4.22 - 5.81 MIL/uL   Hemoglobin 12.1 (L) 13.0 - 17.0 g/dL   HCT 73.7 (L) 10.6 - 26.9 %   MCV 88.7 80.0 - 100.0 fL   MCH 29.7 26.0 - 34.0 pg   MCHC 33.4 30.0 - 36.0 g/dL   RDW 48.5 46.2 - 70.3 %   Platelets 302 150 - 400 K/uL   nRBC 0.0 0.0 - 0.2 %    Comment: Performed at [facility], [address], [place], [place] [address]  Basic metabolic panel     Status: Abnormal   Collection Time: 09/06/22  1:58 AM  Result Value Ref Range   Sodium 137 135 - 145 mmol/L   Potassium 3.5 3.5 - 5.1 mmol/L   Chloride 104 98 - 111 mmol/L   CO2 24 22 - 32 mmol/L   Glucose, Bld 135 (H) 70 - 99 mg/dL    Comment: Glucose reference range applies only to samples taken after fasting for at least 8 hours.   BUN 8 6 - 20 mg/dL   Creatinine, Ser 8.18 0.61 - 1.24 mg/dL   Calcium 8.0 (L) 8.9 - 10.3 mg/dL   GFR, Estimated >29 >93 mL/min    Comment: (NOTE) Calculated using the CKD-EPI Creatinine Equation (2021)    Anion gap 9 5 - 15    Comment: Performed at [facility], [address] [place], [place] [address]  CBC     Status: Abnormal   Collection Time: 09/06/22  1:58 AM  Result Value Ref Range   WBC 10.1 4.0 - 10.5 K/uL   RBC 3.91 (L) 4.22 - 5.81 MIL/uL   Hemoglobin 11.8 (L) 13.0 - 17.0 g/dL   HCT 78.9 (L) 38.1 - 01.7 %   MCV 86.7 80.0 - 100.0 fL   MCH 30.2 26.0 - 34.0 pg   MCHC 34.8 30.0 - 36.0 g/dL   RDW 51.0 25.8 - 52.7 %   Platelets 341 150 - 400 K/uL   nRBC 0.0 0.0 - 0.2 %    Comment: Performed at [facility],  [address],  [place], [place] [address]  CBC with Differential/Platelet     Status: Abnormal   Collection Time: 09/07/22  1:12 AM  Result Value Ref Range   WBC 11.2 (H) 4.0 - 10.5 K/uL   RBC 4.19 (L) 4.22 - 5.81 MIL/uL   Hemoglobin 12.5 (L) 13.0 - 17.0 g/dL   HCT 60.4 (L) 54.0 - 98.1 %   MCV 87.8 80.0 - 100.0 fL   MCH 29.8 26.0 - 34.0 pg   MCHC 34.0 30.0 - 36.0 g/dL   RDW 19.1 47.8 - 29.5 %   Platelets 378 150 - 400 K/uL   nRBC 0.0 0.0 - 0.2 %   Neutrophils Relative % 66 %   Neutro Abs 7.4 1.7 - 7.7 K/uL   Lymphocytes Relative 19 %   Lymphs Abs 2.1 0.7 - 4.0 K/uL   Monocytes Relative 7 %   Monocytes Absolute 0.8 0.1 - 1.0 K/uL   Eosinophils Relative 5 %   Eosinophils Absolute 0.6 (H) 0.0 - 0.5 K/uL   Basophils Relative 1 %   Basophils Absolute 0.1 0.0 - 0.1 K/uL   Immature Granulocytes 2 %   Abs Immature Granulocytes 0.22 (H) 0.00 - 0.07 K/uL    Comment: Performed at [facility], [address], [place], [place] [address]  Comprehensive metabolic panel     Status: Abnormal   Collection Time: 09/07/22  1:12 AM  Result Value Ref Range   Sodium 140 135 - 145 mmol/L   Potassium 4.0 3.5 - 5.1 mmol/L   Chloride 110 98 - 111 mmol/L   CO2 24 22 - 32 mmol/L   Glucose, Bld 113 (H) 70 - 99 mg/dL    Comment: Glucose reference range applies only to samples taken after fasting for at least 8 hours.   BUN 5 (L) 6 - 20 mg/dL   Creatinine, Ser 8.65 0.61 - 1.24 mg/dL   Calcium 8.5 (L) 8.9 - 10.3 mg/dL   Total Protein 5.2 (L) 6.5 - 8.1 g/dL   Albumin 2.3 (L) 3.5 - 5.0 g/dL   AST 28 15 - 41 U/L   ALT 39 0 - 44 U/L   Alkaline Phosphatase 69 38 - 126 U/L   Total Bilirubin 0.6 0.3 - 1.2 mg/dL   GFR, Estimated >78 >46 mL/min    Comment: (NOTE) Calculated using the CKD-EPI Creatinine Equation (2021)    Anion gap 6 5 - 15    Comment: Performed at [facility], [address] [place], [place] [address]  Magnesium     Status: None   Collection Time: 09/07/22  1:12 AM  Result Value Ref Range    Magnesium 2.0 1.7 - 2.4 mg/dL    Comment: Performed at [facility], [address] [place], [place] [address]  Phosphorus     Status: None   Collection Time: 09/07/22  1:12 AM  Result Value Ref Range   Phosphorus 4.0 2.5 - 4.6 mg/dL    Comment: Performed at [facility], [address] [place], [place] [address]  TSH     Status: None   Collection Time: 09/21/22  2:45 PM  Result Value Ref Range   TSH 1.94 0.35 - 5.50 uIU/mL  Magnesium     Status: None   Collection Time: 09/21/22  2:45 PM  Result Value Ref Range   Magnesium 2.0 1.5 - 2.5 mg/dL  Phosphorus     Status: None   Collection Time: 09/21/22  2:45 PM  Result Value Ref Range   Phosphorus 3.3 2.3 - 4.6 mg/dL  Lipid panel     Status: Abnormal   Collection Time: 09/21/22  2:45 PM  Result Value Ref Range   Cholesterol 209 (H) 0 - 200 mg/dL    Comment: ATP III Classification       Desirable:  < 200 mg/dL               Borderline High:  200 - 239 mg/dL          High:  > = 161 mg/dL   Triglycerides 096.0 (H) 0.0 - 149.0 mg/dL    Comment: Normal:  <454 mg/dLBorderline High:  150 - 199 mg/dL   HDL 09.81 (L) >19.14 mg/dL   VLDL 78.2 (H) 0.0 - 95.6 mg/dL   Total CHOL/HDL Ratio 7     Comment:                Men          Women1/2 Average Risk     3.4          3.3Average Risk          5.0          4.42X Average Risk          9.6          7.13X Average Risk          15.0          11.0                       NonHDL 177.86     Comment: NOTE:  Non-HDL goal should be 30 mg/dL higher than patient's LDL goal (i.e. LDL goal of < 70 mg/dL, would have non-HDL goal of < 100 mg/dL)  Comprehensive metabolic panel     Status: None   Collection Time: 09/21/22  2:45 PM  Result Value Ref Range   Sodium 138 135 - 145 mEq/L   Potassium 4.1 3.5 - 5.1 mEq/L   Chloride 101 96 - 112 mEq/L   CO2 31 19 - 32 mEq/L   Glucose, Bld 81 70 - 99 mg/dL   BUN 11 6 - 23 mg/dL   Creatinine, Ser 2.13 0.40 - 1.50 mg/dL   Total Bilirubin 0.5 0.2 - 1.2 mg/dL    Alkaline Phosphatase 93 39 - 117 U/L   AST 17 0 - 37 U/L   ALT 19 0 - 53 U/L   Total Protein 7.2 6.0 - 8.3 g/dL   Albumin 4.4 3.5 - 5.2 g/dL   GFR 086.57 >84.69 mL/min    Comment: Calculated using the CKD-EPI Creatinine Equation (2021)   Calcium 9.5 8.4 - 10.5 mg/dL  CBC     Status: None   Collection Time: 09/21/22  2:45 PM  Result Value Ref Range   WBC 8.4 4.0 - 10.5 K/uL   RBC 5.56 4.22 - 5.81 Mil/uL   Platelets 322.0 150.0 - 400.0 K/uL   Hemoglobin 16.3 13.0 - 17.0 g/dL   HCT 62.9 52.8 - 41.3 %   MCV 84.8 78.0 - 100.0 fl   MCHC 34.6 30.0 - 36.0 g/dL   RDW 24.4 01.0 - 27.2 %  LDL cholesterol, direct     Status: None   Collection Time: 09/21/22  2:45 PM  Result Value Ref Range   Direct LDL 140.0 mg/dL    Comment: Optimal:  <536 mg/dLNear or Above Optimal:  100-129  mg/dLBorderline High:  130-159 mg/dLHigh:  160-189 mg/dLVery High:  >190 mg/dL  Hepatitis C Antibody     Status: None   Collection Time: 09/21/22  2:47 PM  Result Value Ref Range   Hepatitis C Ab NON-REACTIVE NON-REACTIVE    Comment: . HCV antibody was non-reactive. There is no laboratory  evidence of HCV infection. . In most cases, no further action is required. However, if recent HCV exposure is suspected, a test for HCV RNA (test code [address]) is suggested. . For additional information please refer to http://education.[facility].com/faq/FAQ22v1 (This link is being provided for informational/ educational purposes only.) .       Lula Olszewski, MD

## 2022-10-19 NOTE — Assessment & Plan Note (Signed)
He reports to me the Suboxone is working well and he is having no side effects but that it is not enough so I agreed to go up on the dose at his request.  He also told me that his probation officer is concerned that it may increase his risk of relapse and that he has remote history of opioid use disorder and that the recent illness with Percocet management had made him dependent and afraid of withdrawal on opioids again.  I advised him that Suboxone will not increase his risk of relapse and in fact reduces it because he will go through withdrawal without it and his pain from the pneumonia will be on controlled and these factors will both tend to contribute towards return to opioid misuse but he will not have these things happen if he just takes the Suboxone as prescribed.  I will do close follow up for a while also to make sure no problems and plan for long term maintenance management with suboxone with very slow taper.

## 2022-10-20 LAB — DRUG SCREEN, 5 PANEL, UR
Amphetamines, Urine: NEGATIVE ng/mL
Cannabinoid Quant, Ur: NEGATIVE ng/mL
Cocaine (Metab.): NEGATIVE ng/mL
OPIATE QUANTITATIVE URINE: NEGATIVE ng/mL
PCP Quant, Ur: NEGATIVE ng/mL

## 2022-10-24 NOTE — Telephone Encounter (Signed)
Patient has been seen for an OV since then and this has been taken care of.

## 2022-10-25 NOTE — Telephone Encounter (Signed)
Spoke with [facility] and informed them that the PA for this medication is no longer needed, as he is not taking the medication anymore (oxycodone).

## 2022-10-26 ENCOUNTER — Encounter: Payer: Self-pay | Admitting: Internal Medicine

## 2022-10-26 ENCOUNTER — Telehealth (INDEPENDENT_AMBULATORY_CARE_PROVIDER_SITE_OTHER): Payer: BLUE CROSS/BLUE SHIELD | Admitting: Internal Medicine

## 2022-10-26 VITALS — Ht 72.0 in | Wt 170.0 lb

## 2022-10-26 DIAGNOSIS — Z79899 Other long term (current) drug therapy: Secondary | ICD-10-CM | POA: Diagnosis not present

## 2022-10-26 DIAGNOSIS — Z789 Other specified health status: Secondary | ICD-10-CM | POA: Diagnosis not present

## 2022-10-26 DIAGNOSIS — Z720 Tobacco use: Secondary | ICD-10-CM | POA: Diagnosis not present

## 2022-10-26 DIAGNOSIS — F1129 Opioid dependence with unspecified opioid-induced disorder: Secondary | ICD-10-CM | POA: Diagnosis not present

## 2022-10-26 HISTORY — DX: Other long term (current) drug therapy: Z79.899

## 2022-10-26 MED ORDER — BUPRENORPHINE HCL-NALOXONE HCL 8-2 MG SL SUBL: 1.5000 | SUBLINGUAL_TABLET | Freq: Every day | SUBLINGUAL | 0 refills | Status: DC

## 2022-10-26 NOTE — Assessment & Plan Note (Signed)
He has Wellbutrin and patches and gums and he is trying to quit smoking but he is struggling I explained to him about strokes of the insula and how they get rid of cravings and how increasing efforts to set quit dates increase the likelihood of eventually quitting and he said he will give it a try over the holidays I spent 5 minutes counseling for smoking cessation

## 2022-10-26 NOTE — Assessment & Plan Note (Signed)
He is doing really well on the 12 mg he reports that he appears to be doing well it is very difficult for him to come 45 minutes to my office to get an office drug screens so I have allowing for continued video visits and will continue to prescribe that way as long as he looks sober by the video we will and I will have intermittent call in office drug screens as appropriate and randomly.  Will continue the 12 mg for 2 weeks for now and I sent that medicine and I have sent in the naloxone previously.  I spent 5 minutes counseling on ways to stay in recovery including staying connected to social support group and avoiding triggers

## 2022-10-26 NOTE — Assessment & Plan Note (Signed)
I documented this here because this social determinant of health has limited him financially in his ability to get adequate healthcare and treatment for his opioid use disorder. This  financial hardship and his long range from the office is a reason that I have agreed to manage most appointment by video.

## 2022-10-26 NOTE — Progress Notes (Signed)
[facility]: [phone]   Routine VIRTUAL VIDEO Medical Office Visit  Patient:  LONNEL GJERDE      Age: 41 y.o.       Sex:  male  Date:   10/26/2022  PCP:    Lula Olszewski, MD   Today's Healthcare Provider: Lula Olszewski, MD  Assessment/Plan:    Elyan was seen today for medication refill.  Tobacco use Assessment & Plan: He has Wellbutrin and patches and gums and he is trying to quit smoking but he is struggling I explained to him about strokes of the insula and how they get rid of cravings and how increasing efforts to set quit dates increase the likelihood of eventually quitting and he said he will give it a try over the holidays I spent 5 minutes counseling for smoking cessation   Opioid dependence with opioid-induced disorder (HCC) Overview: History of legal issues associated with opioid use disorder. Was hospitalized due to severe pneumonia with chest pain leading to resumption of percocet that made him fear return to opioid misuse Transitioned to suboxone rather than go through withdrawal 10/2022- probation officer concerned about the suboxone. Has been very forthcoming about this issue and concern  Assessment & Plan: He is doing really well on the 12 mg he reports that he appears to be doing well it is very difficult for him to come 45 minutes to my office to get an office drug screens so I have allowing for continued video visits and will continue to prescribe that way as long as he looks sober by the video we will and I will have intermittent call in office drug screens as appropriate and randomly.  Will continue the 12 mg for 2 weeks for now and I sent that medicine and I have sent in the naloxone previously.  I spent 5 minutes counseling on ways to stay in recovery including staying connected to social support group and avoiding triggers  Orders: -     Buprenorphine HCl-Naloxone HCl; Place 1.5 tablets under the tongue daily for 14 days.   Dispense: 21 tablet; Refill: 0  High risk medication use Overview: Suboxone for opioid use disorder  Assessment & Plan: I encouraged him to stay on Suboxone long-term and he is agreeable only slow taper eventually may be I checked the Board of pharmacy and I explained that I would need him to be on video at least and appears sober at each follow-up visit but he has financial constraints with the long travel time to my office and Primary Care Provider (PCP) who manage addiction are rare and not available where he is He is already under contract with me for this   History of incarceration Overview: He self reported this was due to history of iatrogenic opioid use disorder that got out of control,  Assessment & Plan: I documented this here because this social determinant of health has limited him financially in his ability to get adequate healthcare and treatment for his opioid use disorder. This  financial hardship and his long range from the office is a reason that I have agreed to manage most appointment by video.        Subjective:   HODGE STACHNIK is a 41 y.o. male with the following chart data reviewed: Past Medical History:  Diagnosis Date   Allergy    Anxiety disorder 09/21/2022   Hydroxyzine failing Valium helped in hospital Being sure of breath has been really scary and makes him breathe  harder and makes him feel sick with concern for respiratory failure so I agreed to additional Valium but warned him about the risk of addiction   Current smoker    Depression    Herpes zoster    High risk medication use 10/26/2022   Suboxone for opioid use disorder   Kidney calculus    Mild emphysema (HCC) 08/30/2022   noted on ct scan   Pneumonia 2023   Tobacco dependence 09/21/2022   Outpatient Medications Prior to Visit  Medication Sig   acetaminophen (TYLENOL) 325 MG tablet Take 2 tablets (650 mg total) by mouth every 6 (six) hours as needed for mild pain (or Fever >/= 101).    albuterol (VENTOLIN HFA) 108 (90 Base) MCG/ACT inhaler Inhale 2 puffs into the lungs every 6 (six) hours as needed for wheezing or shortness of breath.   buPROPion (WELLBUTRIN XL) 150 MG 24 hr tablet Take 1 tablet (150 mg total) by mouth daily.   dextromethorphan-guaiFENesin (TUSSIN DM) 10-100 MG/5ML liquid Take 5 mLs by mouth every 4 (four) hours as needed for cough.   diazepam (VALIUM) 2 MG tablet Take 1 tablet (2 mg total) by mouth every 6 (six) hours as needed for anxiety (do not mix with oxycodone).   diazepam (VALIUM) 2 MG tablet Take 1 tablet (2 mg total) by mouth every 6 (six) hours as needed for anxiety (do not mix with oxycodone).   hydrOXYzine (ATARAX) 50 MG tablet Take 1 tablet (50 mg total) by mouth in the morning and at bedtime.   ibuprofen (ADVIL) 800 MG tablet Take 1 tablet (800 mg total) by mouth every 8 (eight) hours as needed.   nicotine (NICODERM CQ - DOSED IN MG/24 HOURS) 21 mg/24hr patch Place 1 patch (21 mg total) onto the skin daily.   nicotine (NICOTROL) 10 MG inhaler Inhale 1 Cartridge (1 continuous puffing total) into the lungs as needed for smoking cessation.   nicotine polacrilex (NICORETTE) 4 MG gum Take 1 each (4 mg total) by mouth as needed for smoking cessation.   nicotine polacrilex (NICOTINE MINI) 4 MG lozenge Take 1 lozenge (4 mg total) by mouth as needed for smoking cessation.   oxymetazoline (AFRIN) 0.05 % nasal spray Place 1 spray into both nostrils daily as needed for congestion.   senna-docusate (SENOKOT-S) 8.6-50 MG tablet Take 1 tablet by mouth at bedtime as needed for mild constipation.   sertraline (ZOLOFT) 50 MG tablet Take 1 tablet (50 mg total) by mouth daily. Do not sudden stop Start at half tablet first 2 weeks   [DISCONTINUED] buprenorphine-naloxone (SUBOXONE) 8-2 mg SUBL SL tablet Place 1.5 tablets under the tongue daily for 8 days.   naloxone (NARCAN) nasal spray 4 mg/0.1 mL Place 1 spray into the nose once for 1 dose. Use for over sedation for  anyone suspected of overdose.   No facility-administered medications prior to visit.     He presented today reporting reason for visit as: Chief Complaint  Patient presents with   Medication Refill    Pt would like to have rx refilled.     Problem-focused charting was used to record today's medical interview as problems and problem overview medical record updates as follows: Problem  High Risk Medication Use   Suboxone for opioid use disorder   History of Incarceration   He self reported this was due to history of iatrogenic opioid use disorder that got out of control,   Opioid Dependence (Hcc)   History of legal issues associated with  opioid use disorder. Was hospitalized due to severe pneumonia with chest pain leading to resumption of percocet that made him fear return to opioid misuse Transitioned to suboxone rather than go through withdrawal 10/2022- probation officer concerned about the suboxone. Has been very forthcoming about this issue and concern   Tobacco Use          Objective:  Physical Exam  Ht 6' (1.829 m)   Wt 170 lb (77.1 kg)   BMI 23.06 kg/m  Vital signs reviewed.  Nursing notes reviewed. General Appearance/Constitutional:  Well developed, well nourished male in no acute distress Musculoskeletal: All extremities are intact.  Neurological:  Awake, alert,  No obvious focal neurological deficits or cognitive impairments Psychiatric:  Appropriate mood, pleasant demeanor Problem-specific findings:  looks clearheaded and sober by video    Today's Telemedicine visit was conducted via Video for 70m 18s after consent for telemedicine was obtained:   Video connection was never lost. Location of the Healthcare Provider:   Nature conservation officer at [facility]  [address] [place], [place] [address] Location of the Patient / Client: [address] [place] [address]  Video Call Participants - all identities confirmed  visually and verbally: Healthcare Provider:  Lula Olszewski, MD  Patient / Client: [name]  Patient support:  None he sits in his car alone      Lula Olszewski, MD

## 2022-10-26 NOTE — Assessment & Plan Note (Addendum)
I encouraged him to stay on Suboxone long-term and he is agreeable only slow taper eventually may be I checked the Board of pharmacy and I explained that I would need him to be on video at least and appears sober at each follow-up visit but he has financial constraints with the long travel time to my office and Primary Care Provider (PCP) who manage addiction are rare and not available where he is He is already under contract with me for this

## 2022-11-06 ENCOUNTER — Other Ambulatory Visit: Payer: Self-pay | Admitting: Internal Medicine

## 2022-11-06 DIAGNOSIS — F17218 Nicotine dependence, cigarettes, with other nicotine-induced disorders: Secondary | ICD-10-CM

## 2022-11-06 NOTE — Telephone Encounter (Signed)
No problem.

## 2022-11-08 ENCOUNTER — Encounter: Payer: Self-pay | Admitting: Internal Medicine

## 2022-11-08 ENCOUNTER — Telehealth (INDEPENDENT_AMBULATORY_CARE_PROVIDER_SITE_OTHER): Payer: BLUE CROSS/BLUE SHIELD | Admitting: Internal Medicine

## 2022-11-08 DIAGNOSIS — F1129 Opioid dependence with unspecified opioid-induced disorder: Secondary | ICD-10-CM

## 2022-11-08 DIAGNOSIS — F17200 Nicotine dependence, unspecified, uncomplicated: Secondary | ICD-10-CM | POA: Diagnosis not present

## 2022-11-08 DIAGNOSIS — R079 Chest pain, unspecified: Secondary | ICD-10-CM

## 2022-11-08 MED ORDER — BUPROPION HCL ER (XL) 300 MG PO TB24: 300.0000 mg | ORAL_TABLET | Freq: Every day | ORAL | 3 refills | Status: DC

## 2022-11-08 MED ORDER — BUPRENORPHINE HCL-NALOXONE HCL 8-2 MG SL SUBL: 1.5000 | SUBLINGUAL_TABLET | Freq: Every day | SUBLINGUAL | 0 refills | Status: DC

## 2022-11-08 NOTE — Assessment & Plan Note (Signed)
He reported sobriety and that he has not been smoking cigarettes even since last visit and he would like to go up on the Wellbutrin so I agreed to increase dose to 300 and sent 1 year supply.  I advised him that if he can continue staying sober and improve that with giving me a drug screen that I can get monthly Suboxone since it seems to be working really well.  He also wants to get his cholesterol checked and follow-up but it will not be due until mid February.  So I have refilled the Suboxone for 2 weeks and asked him to schedule a return visit in 2 weeks and maybe another 1 in February for the cholesterol.

## 2022-11-08 NOTE — Assessment & Plan Note (Signed)
He report this is only with deep breaths now, seems to be resolving slowly

## 2022-11-08 NOTE — Progress Notes (Signed)
[facility]: [phone]   Routine Televideo  Medical Office Visit  Patient:  Carl Le      Age: 42 y.o.       Sex:  male  Date:   11/08/2022  PCP:    Lula OlszewskiMorrison, Jermall Isaacson G, MD   Today's Healthcare Provider: Lula Olszewskiyan G Niki Payment, MD  Assessment/Plan:     Casimiro NeedleMichael was seen today for medication refill.  Chest pain, unspecified type Overview: Severe associated with chest tube pneumonia with parapneumonic effusion.  Hurts to breath and hurts to cough. Pain severe- persistent. Prolonged hospitalization Chest pain is worsened by coughing Percocet was transitioned to suboxone and it has continued to improve.   Assessment & Plan: He report this is only with deep breaths now, seems to be resolving slowly   Opioid dependence with opioid-induced disorder (HCC) Overview: History of legal issues associated with opioid use disorder. Was hospitalized due to severe pneumonia with chest pain leading to resumption of percocet that made him fear return to opioid misuse Transitioned to suboxone rather than go through withdrawal 10/2022- probation officer concerned about the suboxone. Has been very forthcoming about this issue and concern  Assessment & Plan: He reported sobriety and that he has not been smoking cigarettes even since last visit and he would like to go up on the Wellbutrin so I agreed to increase dose to 300 and sent 1 year supply.  I advised him that if he can continue staying sober and improve that with giving me a drug screen that I can get monthly Suboxone since it seems to be working really well.  He also wants to get his cholesterol checked and follow-up but it will not be due until mid February.  So I have refilled the Suboxone for 2 weeks and asked him to schedule a return visit in 2 weeks and maybe another 1 in February for the cholesterol.  Orders: -     Buprenorphine HCl-Naloxone HCl; Place 1.5 tablets under the tongue daily for 14 days.  Dispense: 21  tablet; Refill: 0  Smoking -     buPROPion HCl ER (XL); Take 1 tablet (300 mg total) by mouth daily.  Dispense: 90 tablet; Refill: 3      Today's Telemedicine visit was conducted via Video for 9 min after consent for telemedicine was obtained: Video connection was never lost. Location of the Healthcare Provider:   [facility]  [address] [place] [phone] Location of the Patient / Client: [address] [place] [phone]  Video Call Participants - all identities confirmed visually and verbally: Healthcare Provider:  Lula Olszewskiyan G Cherylyn Sundby, MD  Patient / Client: Carl Le  Patient support:  None        Subjective:   Carl NabMichael C Le is a 42 y.o. male with the following chart data reviewed: Past Medical History:  Diagnosis Date   Allergy    Anxiety disorder 09/21/2022   Hydroxyzine failing Valium helped in hospital Being sure of breath has been really scary and makes him breathe harder and makes him feel sick with concern for respiratory failure so I agreed to additional Valium but warned him about the risk of addiction   Current smoker    Depression    Herpes zoster    High risk medication use 10/26/2022   Suboxone for opioid use disorder   Kidney calculus    Mild emphysema (HCC) 08/30/2022   noted on ct scan  Pneumonia 2023   Tobacco dependence 09/21/2022   Outpatient Medications Prior to Visit  Medication Sig   acetaminophen (TYLENOL) 325 MG tablet Take 2 tablets (650 mg total) by mouth every 6 (six) hours as needed for mild pain (or Fever >/= 101).   albuterol (VENTOLIN HFA) 108 (90 Base) MCG/ACT inhaler Inhale 2 puffs into the lungs every 6 (six) hours as needed for wheezing or shortness of breath.   diazepam (VALIUM) 2 MG tablet Take 1 tablet (2 mg total) by mouth every 6 (six) hours as needed for anxiety (do not mix with oxycodone).   diazepam (VALIUM) 2 MG tablet Take 1 tablet (2 mg  total) by mouth every 6 (six) hours as needed for anxiety (do not mix with oxycodone).   hydrOXYzine (ATARAX) 50 MG tablet Take 1 tablet (50 mg total) by mouth in the morning and at bedtime.   ibuprofen (ADVIL) 800 MG tablet Take 1 tablet (800 mg total) by mouth every 8 (eight) hours as needed.   nicotine (NICODERM CQ - DOSED IN MG/24 HOURS) 21 mg/24hr patch PLACE 1 PATCH ONTO THE SKIN DAILY.   nicotine (NICOTROL) 10 MG inhaler Inhale 1 Cartridge (1 continuous puffing total) into the lungs as needed for smoking cessation.   nicotine polacrilex (NICORETTE) 4 MG gum Take 1 each (4 mg total) by mouth as needed for smoking cessation.   nicotine polacrilex (NICOTINE MINI) 4 MG lozenge Take 1 lozenge (4 mg total) by mouth as needed for smoking cessation.   oxymetazoline (AFRIN) 0.05 % nasal spray Place 1 spray into both nostrils daily as needed for congestion.   senna-docusate (SENOKOT-S) 8.6-50 MG tablet Take 1 tablet by mouth at bedtime as needed for mild constipation.   sertraline (ZOLOFT) 50 MG tablet Take 1 tablet (50 mg total) by mouth daily. Do not sudden stop Start at half tablet first 2 weeks   [DISCONTINUED] buprenorphine-naloxone (SUBOXONE) 8-2 mg SUBL SL tablet Place 1.5 tablets under the tongue daily for 14 days.   [DISCONTINUED] buPROPion (WELLBUTRIN XL) 150 MG 24 hr tablet Take 1 tablet (150 mg total) by mouth daily.   [DISCONTINUED] dextromethorphan-guaiFENesin (TUSSIN DM) 10-100 MG/5ML liquid Take 5 mLs by mouth every 4 (four) hours as needed for cough.   naloxone (NARCAN) nasal spray 4 mg/0.1 mL Place 1 spray into the nose once for 1 dose. Use for over sedation for anyone suspected of overdose.   No facility-administered medications prior to visit.     He presented today reporting reason for visit as: Chief Complaint  Patient presents with   Medication Refill     Problem-focused charting was used to record today's medical interview as problems and problem overview medical record  updates as follows: Problem  Opioid Dependence (Hcc)   History of legal issues associated with opioid use disorder. Was hospitalized due to severe pneumonia with chest pain leading to resumption of percocet that made him fear return to opioid misuse Transitioned to suboxone rather than go through withdrawal 10/2022- probation officer concerned about the suboxone. Has been very forthcoming about this issue and concern   Chest Pain   Severe associated with chest tube pneumonia with parapneumonic effusion.  Hurts to breath and hurts to cough. Pain severe- persistent. Prolonged hospitalization Chest pain is worsened by coughing Percocet was transitioned to suboxone and it has continued to improve.              Objective:  Physical Exam  There were no vitals taken for this visit. Wt  Readings from Last 10 Encounters:  10/26/22 170 lb (77.1 kg)  10/18/22 180 lb 3.2 oz (81.7 kg)  09/21/22 166 lb 3.2 oz (75.4 kg)  09/03/22 176 lb 9.4 oz (80.1 kg)  08/30/22 170 lb (77.1 kg)   Today's vital signs reviewed.  Nursing notes reviewed. Weight trend reviewed. General Appearance/Constitutional:  Well developed, well nourished male according to BMI standardized data, but waist circumference data should be used instead because BMI-based assessments do not account for lean muscle mass or the fact that only adipose tissue around the waist is unhealthy.  He is in no acute distress. Musculoskeletal: All extremities are intact.  Neurological:  Awake, alert,  No obvious focal neurological deficits or cognitive impairments Psychiatric:  Appropriate mood, pleasant demeanor Problem-specific findings:  appears sober, alert and oriented, no obvious pain on televideo   Results Reviewed:  No results found for any visits on 11/08/22.   Recent Results (from the past 2160 hour(s))  Pleural fluid cell count with differential     Status: Abnormal   Collection Time: 08/30/22 12:35 AM  Result Value Ref Range    Fluid Type-FCT PLEURAL    Color, Fluid AMBER (A) YELLOW   Appearance, Fluid CLOUDY (A) CLEAR   Total Nucleated Cell Count, Fluid 36,950 cu mm   Neutrophil Count, Fluid 96 %   Lymphs, Fluid 2 %   Monocyte-Macrophage-Serous Fluid 1 %   Eos, Fluid 1 %   Other Cells, Fluid RBCS %    Comment: Performed at [facility], [address] [place], [place] [address]  Glucose, pleural fluid     Status: None   Collection Time: 08/30/22 12:35 AM  Result Value Ref Range   Glucose, Fluid 78 mg/dL    Comment: (NOTE) No normal range established for this test Results should be evaluated in conjunction with serum values    Fluid Type-FGLU PLEURAL     Comment: Performed at [facility], [address], [place], [place] [address]  Protein, pleural fluid     Status: None   Collection Time: 08/30/22 12:35 AM  Result Value Ref Range   Total protein, fluid 5.6 g/dL    Comment: (NOTE) No normal range established for this test Results should be evaluated in conjunction with serum values    Fluid Type-FTP PLEURAL     Comment: Performed at [facility], [address] [place], [place] [address]  Lactate dehydrogenase, pleural fluid     Status: Abnormal   Collection Time: 08/30/22 12:35 AM  Result Value Ref Range   LD, Fluid 938 (H) 3 - 23 U/L    Comment: (NOTE) Results should be evaluated in conjunction with serum values    Fluid Type-FLDH PLEURAL     Comment: Performed at [facility], [address] [place], [place] [address]  Triglycerides, Pleural Fluid     Status: None   Collection Time: 08/30/22 12:35 AM  Result Value Ref Range   Triglycerides, Fluid 49 Not Estab. mg/dL    Comment: (NOTE) The reference interval(s) and other method performance specifications have not been established for this body fluid. The test result must be integrated into the clinical context for interpretation. Performed At: [facility] [address] [place], [place]  [id number] Jolene Schimke MD IO:[id number]    Fluid Type-FTRIG PLEURAL     Comment: Performed at [facility], [address] [place], [place] [address]  Cholesterol, pleural fluid     Status: None   Collection Time: 08/30/22  12:35 AM  Result Value Ref Range   Cholesterol, Fluid 93 mg/dL    Comment: (NOTE) INTERPRETIVE INFORMATION: Cholesterol, Body Fluid For information on body fluid reference ranges and/or interpretive guidance visit MetroFlorists.tn This test was developed and its performance characteristics determined by [employer]. It has not been cleared or approved by the [place] Food and Drug Administration. This test was performed in a CLIA certified laboratory and is intended for clinical purposes. Performed At: [facility] [address] [place], [place] [id number] Evans Lance MDPhD LG:[id number]    Chol, Fluid Type PLEURAL     Comment: Performed at [facility], [address] [place], [place] [address]  Pathologist smear review     Status: None   Collection Time: 08/30/22 12:35 AM  Result Value Ref Range   Path Review Cytospin of pleural fluid is reviewed.     Comment: Patient with lobar pneumonia and parapneumonic pleural effusion. Abundant acute inflammation. Negative for malignancy. Findings consistent with clinical impressino of pneumonia. Reviewed by Georgiann Cocker Oneita Kras, M.D. Performed at [facility], [address] [place], [place] [address]   CBC with Differential     Status: Abnormal   Collection Time: 08/30/22  4:16 PM  Result Value Ref Range   WBC 25.5 (H) 4.0 - 10.5 K/uL   RBC 5.46 4.22 - 5.81 MIL/uL   Hemoglobin 16.2 13.0 - 17.0 g/dL   HCT 14.9 70.2 - 63.7 %   MCV 87.4 80.0 - 100.0 fL   MCH 29.7 26.0 - 34.0 pg   MCHC 34.0 30.0 - 36.0 g/dL   RDW 85.8 85.0 - 27.7 %   Platelets 362 150 - 400 K/uL   nRBC 0.0 0.0 - 0.2 %   Neutrophils Relative % 90 %   Neutro Abs 23.0 (H) 1.7 - 7.7  K/uL   Lymphocytes Relative 3 %   Lymphs Abs 0.8 0.7 - 4.0 K/uL   Monocytes Relative 6 %   Monocytes Absolute 1.4 (H) 0.1 - 1.0 K/uL   Eosinophils Relative 0 %   Eosinophils Absolute 0.0 0.0 - 0.5 K/uL   Basophils Relative 0 %   Basophils Absolute 0.1 0.0 - 0.1 K/uL   WBC Morphology MORPHOLOGY UNREMARKABLE    RBC Morphology MORPHOLOGY UNREMARKABLE    Smear Review Normal platelet morphology    Immature Granulocytes 1 %   Abs Immature Granulocytes 0.20 (H) 0.00 - 0.07 K/uL    Comment: Performed at [facility], [address] [place], [place] [address]  Comprehensive metabolic panel     Status: Abnormal   Collection Time: 08/30/22  4:16 PM  Result Value Ref Range   Sodium 135 135 - 145 mmol/L   Potassium 4.7 3.5 - 5.1 mmol/L   Chloride 103 98 - 111 mmol/L   CO2 20 (L) 22 - 32 mmol/L   Glucose, Bld 150 (H) 70 - 99 mg/dL    Comment: Glucose reference range applies only to samples taken after fasting for at least 8 hours.   BUN 13 6 - 20 mg/dL   Creatinine, Ser 8.67 0.61 - 1.24 mg/dL   Calcium 9.5 8.9 - 67.2 mg/dL   Total Protein 8.6 (H) 6.5 - 8.1 g/dL   Albumin 4.3 3.5 - 5.0 g/dL   AST 38 15 - 41 U/L   ALT 82 (H) 0 - 44 U/L   Alkaline Phosphatase 104 38 - 126 U/L   Total Bilirubin 1.9 (H) 0.3 - 1.2 mg/dL  GFR, Estimated >60 >60 mL/min    Comment: (NOTE) Calculated using the CKD-EPI Creatinine Equation (2021)    Anion gap 12 5 - 15    Comment: Performed at [facility], [address] [place], [place] [address]  Troponin I (High Sensitivity)     Status: None   Collection Time: 08/30/22  4:16 PM  Result Value Ref Range   Troponin I (High Sensitivity) 4 <18 ng/L    Comment: (NOTE) Elevated high sensitivity troponin I (hsTnI) values and significant  changes across serial measurements may suggest ACS but many other  chronic and acute conditions are known to elevate hsTnI results.  Refer to the "Links" section for chest pain algorithms and additional   guidance. Performed at [facility], [address] [place], [place] [address]   Culture, blood (routine x 2)     Status: None   Collection Time: 08/30/22  4:16 PM   Specimen: BLOOD RIGHT HAND  Result Value Ref Range   Specimen Description BLOOD RIGHT HAND    Special Requests      BOTTLES DRAWN AEROBIC AND ANAEROBIC Blood Culture adequate volume   Culture      NO GROWTH 5 DAYS Performed at [facility], [address] [place], [place] [address]    Report Status 09/04/2022 FINAL   Culture, blood (routine x 2)     Status: None   Collection Time: 08/30/22  4:16 PM   Specimen: Right Antecubital; Blood  Result Value Ref Range   Specimen Description RIGHT ANTECUBITAL    Special Requests      BOTTLES DRAWN AEROBIC AND ANAEROBIC Blood Culture results may not be optimal due to an excessive volume of blood received in culture bottles   Culture      NO GROWTH 5 DAYS Performed at [facility], [address], [place], [place] [address]    Report Status 09/04/2022 FINAL   Lactic acid, plasma     Status: Abnormal   Collection Time: 08/30/22  4:16 PM  Result Value Ref Range   Lactic Acid, Venous 2.0 (HH) 0.5 - 1.9 mmol/L    Comment: CRITICAL RESULT CALLED TO, READ BACK BY AND VERIFIED WITH ERICKA MEDLIN 08/30/22 1649 KLW Performed at [facility], [address] [place], [place] [address]   SARS Coronavirus 2 by RT PCR (hospital order, performed in [facility] hospital lab) *cepheid single result test* Anterior Nasal Swab     Status: None   Collection Time: 08/30/22  4:55 PM   Specimen: Anterior Nasal Swab  Result Value Ref Range   SARS Coronavirus 2 by RT PCR NEGATIVE NEGATIVE    Comment: (NOTE) SARS-CoV-2 target nucleic acids are NOT DETECTED.  The SARS-CoV-2 RNA is generally detectable in upper and lower respiratory specimens during the acute phase of infection. The lowest concentration of SARS-CoV-2 viral copies this assay can detect is  250 copies / mL. A negative result does not preclude SARS-CoV-2 infection and should not be used as the sole basis for treatment or other patient management decisions.  A negative result may occur with improper specimen collection / handling, submission of specimen other than nasopharyngeal swab, presence of viral mutation(s) within the areas targeted by this assay, and inadequate number of viral copies (<250 copies / mL). A negative result must be combined with clinical observations, patient history, and epidemiological information.  Fact Sheet for Patients:   RoadLapTop.co.za  Fact Sheet for Healthcare Providers: http://kim-miller.com/  This test is not yet  approved or  cleared by the [place] and has been authorized for detection and/or diagnosis of SARS-CoV-2 by FDA under an Emergency Use Authorization (EUA).  This EUA will remain in effect (meaning this test can be used) for the duration of the COVID-19 declaration under Section 564(b)(1) of the Act, 21 U.S.C. section 360bbb-3(b)(1), unless the authorization is terminated or revoked sooner.  Performed at [facility], [address] [place], [place] [address]   Lactic acid, plasma     Status: Abnormal   Collection Time: 08/30/22  6:29 PM  Result Value Ref Range   Lactic Acid, Venous 2.2 (HH) 0.5 - 1.9 mmol/L    Comment: CRITICAL VALUE NOTED. VALUE IS CONSISTENT WITH PREVIOUSLY REPORTED/CALLED VALUE SKL Performed at [facility], [address] [place], [place] [address]   Troponin I (High Sensitivity)     Status: None   Collection Time: 08/30/22  6:29 PM  Result Value Ref Range   Troponin I (High Sensitivity) 3 <18 ng/L    Comment: (NOTE) Elevated high sensitivity troponin I (hsTnI) values and significant  changes across serial measurements may suggest ACS but many other  chronic and acute conditions are known to elevate hsTnI results.  Refer to  the "Links" section for chest pain algorithms and additional  guidance. Performed at [facility], [address] [place] [address] [place], [place] [address]   Blood gas, arterial     Status: Abnormal   Collection Time: 08/30/22  9:31 PM  Result Value Ref Range   O2 Content 3.0 L/min   Delivery systems NASAL CANNULA    pH, Arterial 7.43 7.35 - 7.45   pCO2 arterial 36 32 - 48 mmHg   pO2, Arterial 69 (L) 83 - 108 mmHg   Bicarbonate 23.9 20.0 - 28.0 mmol/L   Acid-base deficit 0.1 0.0 - 2.0 mmol/L   O2 Saturation 97.3 %   Patient temperature 37.0    Collection site RIGHT RADIAL    Allens test (pass/fail) PASS PASS    Comment: Performed at [facility], [address] [place], [place] [address]  MRSA Next Gen by PCR, Nasal     Status: None   Collection Time: 08/31/22 12:15 AM   Specimen: Nasal Mucosa; Nasal Swab  Result Value Ref Range   MRSA by PCR Next Gen NOT DETECTED NOT DETECTED    Comment: (NOTE) The GeneXpert MRSA Assay (FDA approved for NASAL specimens only), is one component of a comprehensive MRSA colonization surveillance program. It is not intended to diagnose MRSA infection nor to guide or monitor treatment for MRSA infections. Test performance is not FDA approved in patients less than 94 years old. Performed at [facility], [address] [place], [place] [address]   Albumin, Pleural Fluid     Status: None   Collection Time: 08/31/22 12:15 AM  Result Value Ref Range   Albumin 3.5 3.5 - 5.0 g/dL    Comment: Performed at [facility], [address] [place], [place] [address]  Lactic acid, plasma     Status: None   Collection Time: 08/31/22 12:15 AM  Result Value Ref Range   Lactic Acid, Venous 1.5 0.5 - 1.9 mmol/L    Comment: Performed at [facility], [address] [place], [place] [address]  HIV Antibody (routine testing w rflx)     Status: None   Collection Time: 08/31/22 12:15 AM  Result Value Ref Range   HIV Screen  4th Generation wRfx Non Reactive  Non Reactive    Comment: Performed at [facility], [place] [address] [place], [place] [address]  Pleural fluid culture w Gram Stain     Status: None   Collection Time: 08/31/22 12:35 AM   Specimen: Pleural Fluid  Result Value Ref Range   Specimen Description      PLEURAL Performed at [facility], [address] [place], [place] [address]    Special Requests      NONE Performed at [facility], [place], [place] [address]    Gram Stain      MODERATE WBC PRESENT, PREDOMINANTLY PMN NO ORGANISMS SEEN    Culture      NO GROWTH 3 DAYS Performed at [facility], [place] [address], [place], [place] [address]    Report Status 09/03/2022 FINAL   CBG monitoring, ED     Status: Abnormal   Collection Time: 08/31/22  2:18 AM  Result Value Ref Range   Glucose-Capillary 125 (H) 70 - 99 mg/dL    Comment: Glucose reference range applies only to samples taken after fasting for at least 8 hours.  Strep pneumoniae urinary antigen     Status: None   Collection Time: 08/31/22  2:20 AM  Result Value Ref Range   Strep Pneumo Urinary Antigen NEGATIVE NEGATIVE    Comment:        Infection due to S. pneumoniae cannot be absolutely ruled out since the antigen present may be below the detection limit of the test. Performed at [facility], [address] [place], [place] [address]   Legionella Pneumophila Serogp 1 Ur Ag     Status: None   Collection Time: 08/31/22  2:20 AM  Result Value Ref Range   L. pneumophila Serogp 1 Ur Ag Negative Negative    Comment: (NOTE) Presumptive negative for L. pneumophila serogroup 1 antigen in urine, suggesting no recent or current infection. Legionnaires' disease cannot be ruled out since other serogroups and species may also cause disease. Performed At: [facility] [place], [place] [id number] Rush Farmer MD CZ:[id number]    Source of Sample URINE,  RANDOM     Comment: Performed at [facility], [address] [place], [place] [address]  Urinalysis, Routine w reflex microscopic     Status: Abnormal   Collection Time: 08/31/22  2:20 AM  Result Value Ref Range   Color, Urine YELLOW (A) YELLOW   APPearance CLEAR (A) CLEAR   Specific Gravity, Urine 1.032 (H) 1.005 - 1.030   pH 5.0 5.0 - 8.0   Glucose, UA NEGATIVE NEGATIVE mg/dL   Hgb urine dipstick NEGATIVE NEGATIVE   Bilirubin Urine NEGATIVE NEGATIVE   Ketones, ur NEGATIVE NEGATIVE mg/dL   Protein, ur NEGATIVE NEGATIVE mg/dL   Nitrite NEGATIVE NEGATIVE   Leukocytes,Ua NEGATIVE NEGATIVE    Comment: Performed at [facility], [address] [place], [place] [address]  CBC with Differential/Platelet     Status: Abnormal   Collection Time: 08/31/22  2:55 AM  Result Value Ref Range   WBC 22.1 (H) 4.0 - 10.5 K/uL   RBC 4.78 4.22 - 5.81 MIL/uL   Hemoglobin 14.0 13.0 - 17.0 g/dL   HCT 41.9 39.0 - 52.0 %   MCV 87.7 80.0 - 100.0 fL   MCH 29.3 26.0 - 34.0 pg   MCHC 33.4 30.0 - 36.0 g/dL   RDW 12.9 11.5 - 15.5 %   Platelets 288 150 - 400  K/uL   nRBC 0.0 0.0 - 0.2 %   Neutrophils Relative % 87 %   Neutro Abs 19.1 (H) 1.7 - 7.7 K/uL   Lymphocytes Relative 4 %   Lymphs Abs 0.9 0.7 - 4.0 K/uL   Monocytes Relative 8 %   Monocytes Absolute 1.8 (H) 0.1 - 1.0 K/uL   Eosinophils Relative 0 %   Eosinophils Absolute 0.0 0.0 - 0.5 K/uL   Basophils Relative 0 %   Basophils Absolute 0.1 0.0 - 0.1 K/uL   Immature Granulocytes 1 %   Abs Immature Granulocytes 0.18 (H) 0.00 - 0.07 K/uL    Comment: Performed at [facility], [address] [place], [place] [address]  Comprehensive metabolic panel     Status: Abnormal   Collection Time: 08/31/22  2:55 AM  Result Value Ref Range   Sodium 135 135 - 145 mmol/L   Potassium 3.7 3.5 - 5.1 mmol/L   Chloride 106 98 - 111 mmol/L   CO2 21 (L) 22 - 32 mmol/L   Glucose, Bld 116 (H) 70 - 99 mg/dL    Comment: Glucose reference  range applies only to samples taken after fasting for at least 8 hours.   BUN 15 6 - 20 mg/dL   Creatinine, Ser 1.91 0.61 - 1.24 mg/dL   Calcium 7.8 (L) 8.9 - 10.3 mg/dL   Total Protein 6.1 (L) 6.5 - 8.1 g/dL   Albumin 3.1 (L) 3.5 - 5.0 g/dL   AST 21 15 - 41 U/L   ALT 46 (H) 0 - 44 U/L   Alkaline Phosphatase 70 38 - 126 U/L   Total Bilirubin 2.0 (H) 0.3 - 1.2 mg/dL   GFR, Estimated >47 >82 mL/min    Comment: (NOTE) Calculated using the CKD-EPI Creatinine Equation (2021)    Anion gap 8 5 - 15    Comment: Performed at [facility], [address] [place], [place] [address]  Hemoglobin A1c     Status: Abnormal   Collection Time: 08/31/22  2:55 AM  Result Value Ref Range   Hgb A1c MFr Bld 4.7 (L) 4.8 - 5.6 %    Comment: (NOTE) Pre diabetes:          5.7%-6.4%  Diabetes:              >6.4%  Glycemic control for   <7.0% adults with diabetes    Mean Plasma Glucose 88.19 mg/dL    Comment: Performed at [facility], [address], [place], [place] [address]  Lactic acid, plasma     Status: None   Collection Time: 08/31/22  2:55 AM  Result Value Ref Range   Lactic Acid, Venous 1.1 0.5 - 1.9 mmol/L    Comment: Performed at [facility], [address] [place], [place] [address]  Magnesium     Status: Abnormal   Collection Time: 08/31/22  2:55 AM  Result Value Ref Range   Magnesium 1.4 (L) 1.7 - 2.4 mg/dL    Comment: Performed at [facility], [address] [place], [place] [address]  Phosphorus     Status: Abnormal   Collection Time: 08/31/22  2:55 AM  Result Value Ref Range   Phosphorus 2.4 (L) 2.5 - 4.6 mg/dL    Comment: Performed at [facility], [address], [place], [place] [address]  Procalcitonin - Baseline     Status: None   Collection Time: 08/31/22  2:55 AM  Result Value Ref Range   Procalcitonin  2.15 ng/mL    Comment:        Interpretation: PCT > 2 ng/mL: Systemic infection (sepsis) is likely, unless other  causes are known. (NOTE)       Sepsis PCT Algorithm           Lower Respiratory Tract                                      Infection PCT Algorithm    ----------------------------     ----------------------------         PCT < 0.25 ng/mL                PCT < 0.10 ng/mL          Strongly encourage             Strongly discourage   discontinuation of antibiotics    initiation of antibiotics    ----------------------------     -----------------------------       PCT 0.25 - 0.50 ng/mL            PCT 0.10 - 0.25 ng/mL               OR       >80% decrease in PCT            Discourage initiation of                                            antibiotics      Encourage discontinuation           of antibiotics    ----------------------------     -----------------------------         PCT >= 0.50 ng/mL              PCT 0.26 - 0.50 ng/mL               AND       <80% decrease in PCT              Encourage initiation of                                             antibiotics       Encourage continuation           of antibiotics    ----------------------------     -----------------------------        PCT >= 0.50 ng/mL                  PCT > 0.50 ng/mL               AND         increase in PCT                  Strongly encourage                                      initiation of antibiotics    Strongly encourage escalation           of antibiotics                                     -----------------------------  PCT <= 0.25 ng/mL                                                 OR                                        > 80% decrease in PCT                                      Discontinue / Do not initiate                                             antibiotics  Performed at [facility], [address] [place], [place] [address]   CBG monitoring, ED     Status: Abnormal   Collection Time: 08/31/22  4:41 AM  Result Value Ref Range    Glucose-Capillary 122 (H) 70 - 99 mg/dL    Comment: Glucose reference range applies only to samples taken after fasting for at least 8 hours.   Comment 1 Notify RN    Comment 2 Document in Chart   Glucose, capillary     Status: Abnormal   Collection Time: 08/31/22  5:54 AM  Result Value Ref Range   Glucose-Capillary 120 (H) 70 - 99 mg/dL    Comment: Glucose reference range applies only to samples taken after fasting for at least 8 hours.  Glucose, capillary     Status: Abnormal   Collection Time: 08/31/22  7:42 AM  Result Value Ref Range   Glucose-Capillary 118 (H) 70 - 99 mg/dL    Comment: Glucose reference range applies only to samples taken after fasting for at least 8 hours.  Glucose, capillary     Status: Abnormal   Collection Time: 08/31/22 11:22 AM  Result Value Ref Range   Glucose-Capillary 135 (H) 70 - 99 mg/dL    Comment: Glucose reference range applies only to samples taken after fasting for at least 8 hours.   Comment 1 Notify RN   Glucose, capillary     Status: Abnormal   Collection Time: 08/31/22  4:09 PM  Result Value Ref Range   Glucose-Capillary 112 (H) 70 - 99 mg/dL    Comment: Glucose reference range applies only to samples taken after fasting for at least 8 hours.  Glucose, capillary     Status: Abnormal   Collection Time: 08/31/22  7:22 PM  Result Value Ref Range   Glucose-Capillary 123 (H) 70 - 99 mg/dL    Comment: Glucose reference range applies only to samples taken after fasting for at least 8 hours.  Glucose, capillary     Status: Abnormal   Collection Time: 08/31/22 11:07 PM  Result Value Ref Range   Glucose-Capillary 144 (H) 70 - 99 mg/dL    Comment: Glucose reference range applies only to samples taken after fasting for at least 8 hours.  Glucose, capillary     Status: Abnormal   Collection Time: 09/01/22  3:30 AM  Result Value Ref Range   Glucose-Capillary 116 (H) 70 - 99  mg/dL    Comment: Glucose reference range applies only to samples taken after  fasting for at least 8 hours.  CBC     Status: Abnormal   Collection Time: 09/01/22  5:10 AM  Result Value Ref Range   WBC 24.3 (H) 4.0 - 10.5 K/uL   RBC 4.75 4.22 - 5.81 MIL/uL   Hemoglobin 13.9 13.0 - 17.0 g/dL   HCT 16.1 09.6 - 04.5 %   MCV 86.5 80.0 - 100.0 fL   MCH 29.3 26.0 - 34.0 pg   MCHC 33.8 30.0 - 36.0 g/dL   RDW 40.9 81.1 - 91.4 %   Platelets 277 150 - 400 K/uL   nRBC 0.0 0.0 - 0.2 %    Comment: Performed at [facility], [address] [place], [place] [address]  Basic metabolic panel     Status: Abnormal   Collection Time: 09/01/22  5:10 AM  Result Value Ref Range   Sodium 130 (L) 135 - 145 mmol/L   Potassium 4.1 3.5 - 5.1 mmol/L   Chloride 98 98 - 111 mmol/L   CO2 23 22 - 32 mmol/L   Glucose, Bld 159 (H) 70 - 99 mg/dL    Comment: Glucose reference range applies only to samples taken after fasting for at least 8 hours.   BUN 15 6 - 20 mg/dL   Creatinine, Ser 6.21 0.61 - 1.24 mg/dL   Calcium 8.4 (L) 8.9 - 10.3 mg/dL   GFR, Estimated >30 >86 mL/min    Comment: (NOTE) Calculated using the CKD-EPI Creatinine Equation (2021)    Anion gap 9 5 - 15    Comment: Performed at [facility], [address], [place], [place] [address]  Magnesium     Status: None   Collection Time: 09/01/22  5:10 AM  Result Value Ref Range   Magnesium 2.0 1.7 - 2.4 mg/dL    Comment: Performed at [facility], [address] [place], [place] [address]  Phosphorus     Status: Abnormal   Collection Time: 09/01/22  5:10 AM  Result Value Ref Range   Phosphorus 2.4 (L) 2.5 - 4.6 mg/dL    Comment: Performed at [facility], [address] [place], [place] [address]  Glucose, capillary     Status: Abnormal   Collection Time: 09/01/22  7:37 AM  Result Value Ref Range   Glucose-Capillary 110 (H) 70 - 99 mg/dL    Comment: Glucose reference range applies only to samples taken after fasting for at least 8 hours.  Glucose, capillary     Status: Abnormal    Collection Time: 09/01/22 11:12 AM  Result Value Ref Range   Glucose-Capillary 111 (H) 70 - 99 mg/dL    Comment: Glucose reference range applies only to samples taken after fasting for at least 8 hours.  Glucose, capillary     Status: Abnormal   Collection Time: 09/01/22  7:27 PM  Result Value Ref Range   Glucose-Capillary 115 (H) 70 - 99 mg/dL    Comment: Glucose reference range applies only to samples taken after fasting for at least 8 hours.  Glucose, capillary     Status: Abnormal   Collection Time: 09/02/22 12:07 AM  Result Value Ref Range   Glucose-Capillary 145 (H) 70 - 99 mg/dL    Comment: Glucose reference range applies only to samples taken after fasting for at least 8 hours.  Glucose, capillary     Status: Abnormal   Collection Time: 09/02/22  3:46  AM  Result Value Ref Range   Glucose-Capillary 150 (H) 70 - 99 mg/dL    Comment: Glucose reference range applies only to samples taken after fasting for at least 8 hours.  Glucose, capillary     Status: None   Collection Time: 09/02/22  7:31 AM  Result Value Ref Range   Glucose-Capillary 97 70 - 99 mg/dL    Comment: Glucose reference range applies only to samples taken after fasting for at least 8 hours.  Glucose, capillary     Status: Abnormal   Collection Time: 09/02/22 11:17 AM  Result Value Ref Range   Glucose-Capillary 139 (H) 70 - 99 mg/dL    Comment: Glucose reference range applies only to samples taken after fasting for at least 8 hours.  Glucose, capillary     Status: Abnormal   Collection Time: 09/02/22  3:26 PM  Result Value Ref Range   Glucose-Capillary 151 (H) 70 - 99 mg/dL    Comment: Glucose reference range applies only to samples taken after fasting for at least 8 hours.  Glucose, capillary     Status: Abnormal   Collection Time: 09/02/22  7:10 PM  Result Value Ref Range   Glucose-Capillary 149 (H) 70 - 99 mg/dL    Comment: Glucose reference range applies only to samples taken after fasting for at least 8  hours.  Comprehensive metabolic panel     Status: Abnormal   Collection Time: 09/03/22  1:06 AM  Result Value Ref Range   Sodium 133 (L) 135 - 145 mmol/L   Potassium 4.1 3.5 - 5.1 mmol/L   Chloride 101 98 - 111 mmol/L   CO2 24 22 - 32 mmol/L   Glucose, Bld 131 (H) 70 - 99 mg/dL    Comment: Glucose reference range applies only to samples taken after fasting for at least 8 hours.   BUN 10 6 - 20 mg/dL   Creatinine, Ser 1.610.72 0.61 - 1.24 mg/dL   Calcium 8.0 (L) 8.9 - 10.3 mg/dL   Total Protein 5.4 (L) 6.5 - 8.1 g/dL   Albumin 2.1 (L) 3.5 - 5.0 g/dL   AST 38 15 - 41 U/L   ALT 49 (H) 0 - 44 U/L   Alkaline Phosphatase 80 38 - 126 U/L   Total Bilirubin 0.4 0.3 - 1.2 mg/dL   GFR, Estimated >09>60 >60>60 mL/min    Comment: (NOTE) Calculated using the CKD-EPI Creatinine Equation (2021)    Anion gap 8 5 - 15    Comment: Performed at [facility], [address] [place], [place] [address]  CBC     Status: Abnormal   Collection Time: 09/03/22  1:06 AM  Result Value Ref Range   WBC 14.6 (H) 4.0 - 10.5 K/uL   RBC 4.08 (L) 4.22 - 5.81 MIL/uL   Hemoglobin 12.7 (L) 13.0 - 17.0 g/dL   HCT 81.135.3 (L) 91.439.0 - 78.252.0 %   MCV 86.5 80.0 - 100.0 fL   MCH 31.1 26.0 - 34.0 pg   MCHC 36.0 30.0 - 36.0 g/dL   RDW 95.612.8 21.311.5 - 08.615.5 %   Platelets 273 150 - 400 K/uL   nRBC 0.0 0.0 - 0.2 %    Comment: Performed at [facility], [address] [place], [place] [address]  CBC     Status: Abnormal   Collection Time: 09/04/22  1:17 AM  Result Value Ref Range   WBC 12.1 (H) 4.0 - 10.5 K/uL   RBC 4.08 (L)  4.22 - 5.81 MIL/uL   Hemoglobin 12.1 (L) 13.0 - 17.0 g/dL   HCT 09.8 (L) 11.9 - 14.7 %   MCV 88.7 80.0 - 100.0 fL   MCH 29.7 26.0 - 34.0 pg   MCHC 33.4 30.0 - 36.0 g/dL   RDW 82.9 56.2 - 13.0 %   Platelets 302 150 - 400 K/uL   nRBC 0.0 0.0 - 0.2 %    Comment: Performed at [facility], [address] [place], [place] [address]  Basic metabolic panel     Status: Abnormal   Collection Time:  09/06/22  1:58 AM  Result Value Ref Range   Sodium 137 135 - 145 mmol/L   Potassium 3.5 3.5 - 5.1 mmol/L   Chloride 104 98 - 111 mmol/L   CO2 24 22 - 32 mmol/L   Glucose, Bld 135 (H) 70 - 99 mg/dL    Comment: Glucose reference range applies only to samples taken after fasting for at least 8 hours.   BUN 8 6 - 20 mg/dL   Creatinine, Ser 4.69 0.61 - 1.24 mg/dL   Calcium 8.0 (L) 8.9 - 10.3 mg/dL   GFR, Estimated >62 >95 mL/min    Comment: (NOTE) Calculated using the CKD-EPI Creatinine Equation (2021)    Anion gap 9 5 - 15    Comment: Performed at [facility], [address] [place], [place] [address]  CBC     Status: Abnormal   Collection Time: 09/06/22  1:58 AM  Result Value Ref Range   WBC 10.1 4.0 - 10.5 K/uL   RBC 3.91 (L) 4.22 - 5.81 MIL/uL   Hemoglobin 11.8 (L) 13.0 - 17.0 g/dL   HCT 24.4 (L) 01.0 - 27.2 %   MCV 86.7 80.0 - 100.0 fL   MCH 30.2 26.0 - 34.0 pg   MCHC 34.8 30.0 - 36.0 g/dL   RDW 53.6 64.4 - 03.4 %   Platelets 341 150 - 400 K/uL   nRBC 0.0 0.0 - 0.2 %    Comment: Performed at [facility], [address], [place], [place] [address]  CBC with Differential/Platelet     Status: Abnormal   Collection Time: 09/07/22  1:12 AM  Result Value Ref Range   WBC 11.2 (H) 4.0 - 10.5 K/uL   RBC 4.19 (L) 4.22 - 5.81 MIL/uL   Hemoglobin 12.5 (L) 13.0 - 17.0 g/dL   HCT 56.3 (L) 87.5 - 64.3 %   MCV 87.8 80.0 - 100.0 fL   MCH 29.8 26.0 - 34.0 pg   MCHC 34.0 30.0 - 36.0 g/dL   RDW 32.9 51.8 - 84.1 %   Platelets 378 150 - 400 K/uL   nRBC 0.0 0.0 - 0.2 %   Neutrophils Relative % 66 %   Neutro Abs 7.4 1.7 - 7.7 K/uL   Lymphocytes Relative 19 %   Lymphs Abs 2.1 0.7 - 4.0 K/uL   Monocytes Relative 7 %   Monocytes Absolute 0.8 0.1 - 1.0 K/uL   Eosinophils Relative 5 %   Eosinophils Absolute 0.6 (H) 0.0 - 0.5 K/uL   Basophils Relative 1 %   Basophils Absolute 0.1 0.0 - 0.1 K/uL   Immature Granulocytes 2 %   Abs Immature Granulocytes 0.22 (H) 0.00 - 0.07 K/uL     Comment: Performed at [facility], [address] [place], [place] [address]  Comprehensive metabolic panel     Status: Abnormal   Collection Time: 09/07/22  1:12  AM  Result Value Ref Range   Sodium 140 135 - 145 mmol/L   Potassium 4.0 3.5 - 5.1 mmol/L   Chloride 110 98 - 111 mmol/L   CO2 24 22 - 32 mmol/L   Glucose, Bld 113 (H) 70 - 99 mg/dL    Comment: Glucose reference range applies only to samples taken after fasting for at least 8 hours.   BUN 5 (L) 6 - 20 mg/dL   Creatinine, Ser 8.41 0.61 - 1.24 mg/dL   Calcium 8.5 (L) 8.9 - 10.3 mg/dL   Total Protein 5.2 (L) 6.5 - 8.1 g/dL   Albumin 2.3 (L) 3.5 - 5.0 g/dL   AST 28 15 - 41 U/L   ALT 39 0 - 44 U/L   Alkaline Phosphatase 69 38 - 126 U/L   Total Bilirubin 0.6 0.3 - 1.2 mg/dL   GFR, Estimated >32 >44 mL/min    Comment: (NOTE) Calculated using the CKD-EPI Creatinine Equation (2021)    Anion gap 6 5 - 15    Comment: Performed at [facility], [address] [place], [place] [address]  Magnesium     Status: None   Collection Time: 09/07/22  1:12 AM  Result Value Ref Range   Magnesium 2.0 1.7 - 2.4 mg/dL    Comment: Performed at [facility], [address] [place], [place] [address]  Phosphorus     Status: None   Collection Time: 09/07/22  1:12 AM  Result Value Ref Range   Phosphorus 4.0 2.5 - 4.6 mg/dL    Comment: Performed at [facility], [address], [place], [place] [address]  TSH     Status: None   Collection Time: 09/21/22  2:45 PM  Result Value Ref Range   TSH 1.94 0.35 - 5.50 uIU/mL  Magnesium     Status: None   Collection Time: 09/21/22  2:45 PM  Result Value Ref Range   Magnesium 2.0 1.5 - 2.5 mg/dL  Phosphorus     Status: None   Collection Time: 09/21/22  2:45 PM  Result Value Ref Range   Phosphorus 3.3 2.3 - 4.6 mg/dL  Lipid panel     Status: Abnormal   Collection Time: 09/21/22  2:45 PM  Result Value Ref Range   Cholesterol 209 (H) 0 - 200 mg/dL    Comment: ATP III  Classification       Desirable:  < 200 mg/dL               Borderline High:  200 - 239 mg/dL          High:  > = 742 mg/dL   Triglycerides 595.6 (H) 0.0 - 149.0 mg/dL    Comment: Normal:  <387 mg/dLBorderline High:  150 - 199 mg/dL   HDL 56.43 (L) >32.95 mg/dL   VLDL 18.8 (H) 0.0 - 41.6 mg/dL   Total CHOL/HDL Ratio 7     Comment:                Men          Women1/2 Average Risk     3.4          3.3Average Risk          5.0          4.42X Average Risk          9.6          7.13X Average Risk  15.0          11.0                       NonHDL 177.86     Comment: NOTE:  Non-HDL goal should be 30 mg/dL higher than patient's LDL goal (i.e. LDL goal of < 70 mg/dL, would have non-HDL goal of < 100 mg/dL)  Comprehensive metabolic panel     Status: None   Collection Time: 09/21/22  2:45 PM  Result Value Ref Range   Sodium 138 135 - 145 mEq/L   Potassium 4.1 3.5 - 5.1 mEq/L   Chloride 101 96 - 112 mEq/L   CO2 31 19 - 32 mEq/L   Glucose, Bld 81 70 - 99 mg/dL   BUN 11 6 - 23 mg/dL   Creatinine, Ser 1.61 0.40 - 1.50 mg/dL   Total Bilirubin 0.5 0.2 - 1.2 mg/dL   Alkaline Phosphatase 93 39 - 117 U/L   AST 17 0 - 37 U/L   ALT 19 0 - 53 U/L   Total Protein 7.2 6.0 - 8.3 g/dL   Albumin 4.4 3.5 - 5.2 g/dL   GFR 096.04 >54.09 mL/min    Comment: Calculated using the CKD-EPI Creatinine Equation (2021)   Calcium 9.5 8.4 - 10.5 mg/dL  CBC     Status: None   Collection Time: 09/21/22  2:45 PM  Result Value Ref Range   WBC 8.4 4.0 - 10.5 K/uL   RBC 5.56 4.22 - 5.81 Mil/uL   Platelets 322.0 150.0 - 400.0 K/uL   Hemoglobin 16.3 13.0 - 17.0 g/dL   HCT 81.1 91.4 - 78.2 %   MCV 84.8 78.0 - 100.0 fl   MCHC 34.6 30.0 - 36.0 g/dL   RDW 95.6 21.3 - 08.6 %  LDL cholesterol, direct     Status: None   Collection Time: 09/21/22  2:45 PM  Result Value Ref Range   Direct LDL 140.0 mg/dL    Comment: Optimal:  <578 mg/dLNear or Above Optimal:  100-129 mg/dLBorderline High:  130-159 mg/dLHigh:  160-189  mg/dLVery High:  >190 mg/dL  Hepatitis C Antibody     Status: None   Collection Time: 09/21/22  2:47 PM  Result Value Ref Range   Hepatitis C Ab NON-REACTIVE NON-REACTIVE    Comment: . HCV antibody was non-reactive. There is no laboratory  evidence of HCV infection. . In most cases, no further action is required. However, if recent HCV exposure is suspected, a test for HCV RNA (test code 46962) is suggested. . For additional information please refer to http://education.[facility].com/faq/FAQ22v1 (This link is being provided for informational/ educational purposes only.) .   Drug Screen, 5 Panel, Ur     Status: None   Collection Time: 10/18/22  2:26 PM  Result Value Ref Range   Amphetamines, Urine Negative Cutoff=1000 ng/mL    Comment: Amphetamine test includes Amphetamine and Methamphetamine.   Cannabinoid Quant, Ur Negative Cutoff=50 ng/mL   Cocaine (Metab.) Negative Cutoff=300 ng/mL   OPIATE QUANTITATIVE URINE Negative Cutoff=2000 ng/mL    Comment: Opiate test includes Codeine and Morphine only.   PCP Quant, Ur Negative Cutoff=25 ng/mL      Lula Olszewski, MD

## 2022-11-24 ENCOUNTER — Telehealth: Payer: Self-pay | Admitting: Internal Medicine

## 2022-11-24 ENCOUNTER — Encounter: Payer: Self-pay | Admitting: Internal Medicine

## 2022-11-24 ENCOUNTER — Telehealth (INDEPENDENT_AMBULATORY_CARE_PROVIDER_SITE_OTHER): Payer: BLUE CROSS/BLUE SHIELD | Admitting: Internal Medicine

## 2022-11-24 DIAGNOSIS — F17218 Nicotine dependence, cigarettes, with other nicotine-induced disorders: Secondary | ICD-10-CM

## 2022-11-24 DIAGNOSIS — F1129 Opioid dependence with unspecified opioid-induced disorder: Secondary | ICD-10-CM

## 2022-11-24 DIAGNOSIS — Z79899 Other long term (current) drug therapy: Secondary | ICD-10-CM

## 2022-11-24 DIAGNOSIS — F319 Bipolar disorder, unspecified: Secondary | ICD-10-CM | POA: Insufficient documentation

## 2022-11-24 DIAGNOSIS — J189 Pneumonia, unspecified organism: Secondary | ICD-10-CM | POA: Diagnosis not present

## 2022-11-24 DIAGNOSIS — Z720 Tobacco use: Secondary | ICD-10-CM | POA: Diagnosis not present

## 2022-11-24 DIAGNOSIS — F17299 Nicotine dependence, other tobacco product, with unspecified nicotine-induced disorders: Secondary | ICD-10-CM

## 2022-11-24 MED ORDER — ARIPIPRAZOLE 2 MG PO TABS: 2.0000 mg | ORAL_TABLET | Freq: Every day | ORAL | 2 refills | Status: DC

## 2022-11-24 MED ORDER — BUPRENORPHINE HCL-NALOXONE HCL 8-2 MG SL SUBL: 1.5000 | SUBLINGUAL_TABLET | Freq: Every day | SUBLINGUAL | 0 refills | Status: DC

## 2022-11-24 MED ORDER — NICOTINE 10 MG IN INHA: 1.0000 | RESPIRATORY_TRACT | 0 refills | Status: DC | PRN

## 2022-11-24 NOTE — Assessment & Plan Note (Signed)
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

## 2022-11-24 NOTE — Assessment & Plan Note (Addendum)
Reminded him to complete the follow up xray and he has had pesky cough Encouraged smoking cessation

## 2022-11-24 NOTE — Telephone Encounter (Signed)
Pt needs refill for  nicotine (NICOTROL) 10 MG inhaler  Only at   [place], [place] [[id number]]    That is the only place that has that RX  Please only send this ONE X to the above pharmacy

## 2022-11-24 NOTE — Progress Notes (Signed)
Carl Le: [phone]   Virtual Medical Office Visit - Video Telemedicine   Patient:  Carl Le (1981/04/05) located at home MRN:   [id number]      Date:   11/24/2022  PCP:    Carl Olszewski, MD   Today's Healthcare Provider: Lula Olszewski, MD located at office: [facility] [address], [place] [address] Today's Telemedicine visit was conducted via Video for 71m 55s after consent for telemedicine was obtained:  Video connection was never lost All video encounter participant identities and locations confirmed visually and verbally.   Problem Focused Charting:   Medical Decision Making per Assessment/Plan   Carl Le was seen today for medication refill, pneumonia, nicotine dependence and mood disorder.  Opioid dependence with opioid-induced disorder (HCC) Overview: History of legal issues associated with opioid use disorder. Was hospitalized due to severe pneumonia with chest pain leading to resumption of percocet that made him fear return to opioid misuse Transitioned to suboxone rather than go through withdrawal 10/2022- probation officer concerned about the suboxone. Has been very forthcoming about this issue and concern  Assessment & Plan: Seems to be doing outstandingly on 12 mg Suboxone daily encouraged him to keep taking it consistently and not sudden stop Discussed possibly going to 28 days at a time but he is having some behavior that is concerning for bipolar flaring on Zoloft/wellbutrin and so encouraged him to stay at 14 days and he was agreeable but he did not want to see behavioral health for support even though I offered he had bad experience in the past  Orders: -     Buprenorphine HCl-Naloxone HCl; Place 1.5 tablets under the tongue daily for 14 days.  Dispense: 21 tablet; Refill: 0  Community acquired pneumonia of right lower lobe of lung Overview: Discharged 09/07/22 Complicated by chest  tube, o2 by [place] resp failure Source never found Persistent shortness of breath coughing and right-sided chest pain weeks after being discharged Some things had improved some things had worsened  Assessment & Plan: Reminded him to complete the follow up xray and he has had pesky cough Encouraged smoking cessation   High risk medication use Overview: Suboxone for opioid use disorder  Assessment & Plan: Indication for controlled substance: opioid depenence Medication and dose: see associated problem and med list # pills per month: 12 mg daily Last UDS date: 10/18/22 Controlled substance contract signed (Y/N): Yes, scanned  PDMP last reviewed (include red flags): PDMP reviewed during this encounter.  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  Dispense: 42 each; Refill: 0  Bipolar 1 disorder (HCC) Overview: Reports he  has been maintaining sobriety although he is occasionally doing manage things like spending at thousand dollars on lottery tickets at 11/24/2022 appointment and so considering his  history of mania by report I decided to diagnose this and try to treat it with Abilify which she has tried in the past but did not stay on   Assessment & Plan: Suspect he is already diagnosed with this somewhere but I do not have the records went ahead and made the diagnosis and we will try to treat it with Abilify before he has a relapse or overspending on lottery tickets again.  Encouraged him to continue Wellbutrin and Zoloft as well we will follow-up in just 2 weeks to make sure the Abilify is working for him  Orders: -     ARIPiprazole; Take 1 tablet (2 mg total) by mouth daily.  Dispense: 30 tablet; Refill: 2       Subjective - Clinical Presentation:   Carl Le is a 42 y.o. male  Patient Active Problem List   Diagnosis Date Noted   Bipolar 1 disorder (HCC) 11/24/2022   High risk medication use 10/26/2022   History of incarceration 10/26/2022   Opioid dependence (HCC) 10/18/2022   Chronic bronchitis (HCC) 10/05/2022   Disability examination 09/21/2022   Anxiety disorder 09/21/2022   Chest pain 09/21/2022   Nicotine dependence 09/21/2022   Normocytic anemia 09/07/2022   Tobacco use 09/02/2022   Parapneumonic effusion 08/31/2022   Community acquired pneumonia of right lower lobe of lung 08/30/2022   Kidney stone 04/15/2014   Past Medical History:  Diagnosis Date   Allergy    Anxiety disorder 09/21/2022   Hydroxyzine failing Valium helped in hospital Being sure of breath has been really scary and makes him breathe harder and makes him feel sick with concern for respiratory failure so I agreed to additional Valium but warned him about the risk of addiction   Current smoker    Depression    Herpes zoster    High risk medication use 10/26/2022   Suboxone for opioid use disorder   Kidney calculus    Mild emphysema (HCC) 08/30/2022   noted on ct scan   Pneumonia 2023   Tobacco dependence 09/21/2022    Outpatient Medications Prior to Visit  Medication Sig    acetaminophen (TYLENOL) 325 MG tablet Take 2 tablets (650 mg total) by mouth every 6 (six) hours as needed for mild pain (or Fever >/= 101).   albuterol (VENTOLIN HFA) 108 (90 Base) MCG/ACT inhaler Inhale 2 puffs into the lungs every 6 (six) hours as needed for wheezing or shortness of breath.   buPROPion (WELLBUTRIN XL) 300 MG 24 hr tablet Take 1 tablet (300 mg total) by mouth daily.   hydrOXYzine (ATARAX) 50 MG tablet Take 1 tablet (50 mg total) by mouth in the morning and at bedtime.   ibuprofen (ADVIL) 800 MG tablet Take 1 tablet (800 mg total) by mouth every 8 (eight) hours as needed.   nicotine (NICODERM CQ - DOSED IN MG/24 HOURS) 21 mg/24hr patch PLACE 1 PATCH ONTO THE SKIN DAILY.   nicotine polacrilex (NICORETTE) 4 MG gum Take 1 each (4 mg total) by mouth as needed for smoking cessation.   nicotine polacrilex (NICOTINE MINI) 4 MG lozenge Take 1 lozenge (4 mg total) by mouth as needed for smoking cessation.   oxymetazoline (AFRIN) 0.05 % nasal spray Place 1 spray into both nostrils daily as needed for congestion.   senna-docusate (SENOKOT-S) 8.6-50 MG tablet Take  1 tablet by mouth at bedtime as needed for mild constipation.   sertraline (ZOLOFT) 50 MG tablet Take 1 tablet (50 mg total) by mouth daily. Do not sudden stop Start at half tablet first 2 weeks   [DISCONTINUED] diazepam (VALIUM) 2 MG tablet Take 1 tablet (2 mg total) by mouth every 6 (six) hours as needed for anxiety (do not mix with oxycodone).   [DISCONTINUED] diazepam (VALIUM) 2 MG tablet Take 1 tablet (2 mg total) by mouth every 6 (six) hours as needed for anxiety (do not mix with oxycodone).   [DISCONTINUED] nicotine (NICOTROL) 10 MG inhaler Inhale 1 Cartridge (1 continuous puffing total) into the lungs as needed for smoking cessation.   naloxone (NARCAN) nasal spray 4 mg/0.1 mL Place 1 spray into the nose once for 1 dose. Use for over sedation for anyone suspected of overdose.   [DISCONTINUED] buprenorphine-naloxone (SUBOXONE)  8-2 mg SUBL SL tablet Place 1.5 tablets under the tongue daily for 14 days.   No facility-administered medications prior to visit.    Chief Complaint  Patient presents with   Medication Refill    Suboxone.   Pneumonia   Nicotine Dependence   mood disorder    HPI           Objective:  Physical Exam  There were no vitals taken for this visit. Well developed, well nourished  by BMI criteria but truncal adiposity (waist circumference or caliper) should be used instead. Wt Readings from Last 10 Encounters:  10/26/22 170 lb (77.1 kg)  10/18/22 180 lb 3.2 oz (81.7 kg)  09/21/22 166 lb 3.2 oz (75.4 kg)  09/03/22 176 lb 9.4 oz (80.1 kg)  08/30/22 170 lb (77.1 kg)   Vital signs reviewed.  Nursing notes reviewed. Weight trend reviewed. General Appearance:  Well developed, well nourished male in no acute distress.   Normal work of breathing at rest Musculoskeletal: All extremities are intact.  Neurological:  Awake, alert,  No obvious focal neurological deficits or cognitive impairments Psychiatric:  Appropriate mood, pleasant demeanor Problem-specific findings:  looks sober good video signal   Results Reviewed: No results found for any visits on 11/24/22.  Recent Results (from the past 2160 hour(s))  Pleural fluid cell count with differential     Status: Abnormal   Collection Time: 08/30/22 12:35 AM  Result Value Ref Range   Fluid Type-FCT PLEURAL    Color, Fluid AMBER (A) YELLOW   Appearance, Fluid CLOUDY (A) CLEAR   Total Nucleated Cell Count, Fluid 36,950 cu mm   Neutrophil Count, Fluid 96 %   Lymphs, Fluid 2 %   Monocyte-Macrophage-Serous Fluid 1 %   Eos, Fluid 1 %   Other Cells, Fluid RBCS %    Comment: Performed at [facility], [address] [place] [address] [place], [place] [address]  Glucose, pleural fluid     Status: None   Collection Time: 08/30/22 12:35 AM  Result Value Ref Range   Glucose, Fluid 78 mg/dL    Comment: (NOTE) No normal range established for this  test Results should be evaluated in conjunction with serum values    Fluid Type-FGLU PLEURAL     Comment: Performed at [facility], [address] [place], [place] [address]  Protein, pleural fluid     Status: None   Collection Time: 08/30/22 12:35 AM  Result Value Ref Range   Total protein, fluid 5.6 g/dL    Comment: (NOTE) No normal range established for this test Results should be evaluated in conjunction  with serum values    Fluid Type-FTP PLEURAL     Comment: Performed at [facility], [address] [place], [place] [address]  Lactate dehydrogenase, pleural fluid     Status: Abnormal   Collection Time: 08/30/22 12:35 AM  Result Value Ref Range   LD, Fluid 938 (H) 3 - 23 U/L    Comment: (NOTE) Results should be evaluated in conjunction with serum values    Fluid Type-FLDH PLEURAL     Comment: Performed at [facility], [address] [place], [place] [address]  Triglycerides, Pleural Fluid     Status: None   Collection Time: 08/30/22 12:35 AM  Result Value Ref Range   Triglycerides, Fluid 49 Not Estab. mg/dL    Comment: (NOTE) The reference interval(s) and other method performance specifications have not been established for this body fluid. The test result must be integrated into the clinical context for interpretation. Performed At: [facility] [address] [place], [place] [id number] Jolene Schimke MD WG:[id number]    Fluid Type-FTRIG PLEURAL     Comment: Performed at [facility], [address] [place], [place] [address]  Cholesterol, pleural fluid     Status: None   Collection Time: 08/30/22 12:35 AM  Result Value Ref Range   Cholesterol, Fluid 93 mg/dL    Comment: (NOTE) INTERPRETIVE INFORMATION: Cholesterol, Body Fluid For information on body fluid reference ranges and/or interpretive guidance visit [email or web address] This test was developed and its performance characteristics determined by  [facility]. It has not been cleared or approved by the [place] Food and Drug Administration. This test was performed in a CLIA certified laboratory and is intended for clinical purposes. Performed At: [facility] [address] [place], [place] [id number] Evans Lance MDPhD KG:[id number]    Chol, Fluid Type PLEURAL     Comment: Performed at [facility], [address] [place], [place] [address]  Pathologist smear review     Status: None   Collection Time: 08/30/22 12:35 AM  Result Value Ref Range   Path Review Cytospin of pleural fluid is reviewed.     Comment: Patient with lobar pneumonia and parapneumonic pleural effusion. Abundant acute inflammation. Negative for malignancy. Findings consistent with clinical impressino of pneumonia. Reviewed by Georgiann Cocker Oneita Kras, M.D. Performed at [facility], [address] [place], [place] [address]   CBC with Differential     Status: Abnormal   Collection Time: 08/30/22  4:16 PM  Result Value Ref Range   WBC 25.5 (H) 4.0 - 10.5 K/uL   RBC 5.46 4.22 - 5.81 MIL/uL   Hemoglobin 16.2 13.0 - 17.0 g/dL   HCT 95.6 38.7 - 56.4 %   MCV 87.4 80.0 - 100.0 fL   MCH 29.7 26.0 - 34.0 pg   MCHC 34.0 30.0 - 36.0 g/dL   RDW 33.2 95.1 - 88.4 %   Platelets 362 150 - 400 K/uL   nRBC 0.0 0.0 - 0.2 %   Neutrophils Relative % 90 %   Neutro Abs 23.0 (H) 1.7 - 7.7 K/uL   Lymphocytes Relative 3 %   Lymphs Abs 0.8 0.7 - 4.0 K/uL   Monocytes Relative 6 %   Monocytes Absolute 1.4 (H) 0.1 - 1.0 K/uL   Eosinophils Relative 0 %   Eosinophils Absolute 0.0 0.0 - 0.5 K/uL   Basophils Relative 0 %   Basophils Absolute 0.1 0.0 - 0.1 K/uL   WBC Morphology MORPHOLOGY UNREMARKABLE  RBC Morphology MORPHOLOGY UNREMARKABLE    Smear Review Normal platelet morphology    Immature Granulocytes 1 %   Abs Immature Granulocytes 0.20 (H) 0.00 - 0.07 K/uL    Comment: Performed at [facility], [address]  [place], [place] [address]  Comprehensive metabolic panel     Status: Abnormal   Collection Time: 08/30/22  4:16 PM  Result Value Ref Range   Sodium 135 135 - 145 mmol/L   Potassium 4.7 3.5 - 5.1 mmol/L   Chloride 103 98 - 111 mmol/L   CO2 20 (L) 22 - 32 mmol/L   Glucose, Bld 150 (H) 70 - 99 mg/dL    Comment: Glucose reference range applies only to samples taken after fasting for at least 8 hours.   BUN 13 6 - 20 mg/dL   Creatinine, Ser 6.04 0.61 - 1.24 mg/dL   Calcium 9.5 8.9 - 54.0 mg/dL   Total Protein 8.6 (H) 6.5 - 8.1 g/dL   Albumin 4.3 3.5 - 5.0 g/dL   AST 38 15 - 41 U/L   ALT 82 (H) 0 - 44 U/L   Alkaline Phosphatase 104 38 - 126 U/L   Total Bilirubin 1.9 (H) 0.3 - 1.2 mg/dL   GFR, Estimated >98 >11 mL/min    Comment: (NOTE) Calculated using the CKD-EPI Creatinine Equation (2021)    Anion gap 12 5 - 15    Comment: Performed at [facility], [address], [place], [place] [address]  Troponin I (High Sensitivity)     Status: None   Collection Time: 08/30/22  4:16 PM  Result Value Ref Range   Troponin I (High Sensitivity) 4 <18 ng/L    Comment: (NOTE) Elevated high sensitivity troponin I (hsTnI) values and significant  changes across serial measurements may suggest ACS but many other  chronic and acute conditions are known to elevate hsTnI results.  Refer to the "Links" section for chest pain algorithms and additional  guidance. Performed at [facility], [address] [place], [place] [address]   Culture, blood (routine x 2)     Status: None   Collection Time: 08/30/22  4:16 PM   Specimen: BLOOD RIGHT HAND  Result Value Ref Range   Specimen Description BLOOD RIGHT HAND    Special Requests      BOTTLES DRAWN AEROBIC AND ANAEROBIC Blood Culture adequate volume   Culture      NO GROWTH 5 DAYS Performed at [facility], [address] [place], [place] [address]    Report Status 09/04/2022 FINAL   Culture, blood (routine x 2)     Status:  None   Collection Time: 08/30/22  4:16 PM   Specimen: Right Antecubital; Blood  Result Value Ref Range   Specimen Description RIGHT ANTECUBITAL    Special Requests      BOTTLES DRAWN AEROBIC AND ANAEROBIC Blood Culture results may not be optimal due to an excessive volume of blood received in culture bottles   Culture      NO GROWTH 5 DAYS Performed at [facility], [address] [place], [place] [address]    Report Status 09/04/2022 FINAL   Lactic acid, plasma     Status: Abnormal   Collection Time: 08/30/22  4:16 PM  Result Value Ref Range   Lactic Acid, Venous 2.0 (HH) 0.5 - 1.9 mmol/L    Comment: CRITICAL RESULT CALLED TO, READ BACK BY AND VERIFIED WITH ERICKA MEDLIN 08/30/22 1649 KLW Performed at [facility], [address] [place], [place] [address]   SARS Coronavirus 2 by RT PCR (hospital order, performed in [facility]) *cepheid single result test* Anterior Nasal Swab     Status: None   Collection Time: 08/30/22  4:55 PM   Specimen: Anterior Nasal Swab  Result Value Ref Range   SARS Coronavirus 2 by RT PCR NEGATIVE NEGATIVE    Comment: (NOTE) SARS-CoV-2 target nucleic acids are NOT DETECTED.  The SARS-CoV-2 RNA is generally detectable in upper and lower respiratory specimens during the acute phase of infection. The lowest concentration of SARS-CoV-2 viral copies this assay can detect is 250 copies / mL. A negative result does not preclude SARS-CoV-2 infection and should not be used as the sole basis for treatment or other patient management decisions.  A negative result may occur with improper specimen collection / handling, submission of specimen other than nasopharyngeal swab, presence of viral mutation(s) within the areas targeted by this assay, and inadequate number of viral copies (<250 copies / mL). A negative result must be combined with clinical observations, patient history, and epidemiological information.  Fact Sheet for  Patients:   RoadLapTop.co.zahttps://www.fda.gov/media/158405/download  Fact Sheet for Healthcare Providers: http://kim-miller.com/https://www.fda.gov/media/158404/download  This test is not yet approved or  cleared by the [place] FDA and has been authorized for detection and/or diagnosis of SARS-CoV-2 by FDA under an Emergency Use Authorization (EUA).  This EUA will remain in effect (meaning this test can be used) for the duration of the COVID-19 declaration under Section 564(b)(1) of the Act, 21 U.S.C. section 360bbb-3(b)(1), unless the authorization is terminated or revoked sooner.  Performed at [facility], [address] [place], [place] [address]   Lactic acid, plasma     Status: Abnormal   Collection Time: 08/30/22  6:29 PM  Result Value Ref Range   Lactic Acid, Venous 2.2 (HH) 0.5 - 1.9 mmol/L    Comment: CRITICAL VALUE NOTED. VALUE IS CONSISTENT WITH PREVIOUSLY REPORTED/CALLED VALUE SKL Performed at [facility], [address] [place], [place] [address]   Troponin I (High Sensitivity)     Status: None   Collection Time: 08/30/22  6:29 PM  Result Value Ref Range   Troponin I (High Sensitivity) 3 <18 ng/L    Comment: (NOTE) Elevated high sensitivity troponin I (hsTnI) values and significant  changes across serial measurements may suggest ACS but many other  chronic and acute conditions are known to elevate hsTnI results.  Refer to the "Links" section for chest pain algorithms and additional  guidance. Performed at [facility], [address] [place], [place] [address]   Blood gas, arterial     Status: Abnormal   Collection Time: 08/30/22  9:31 PM  Result Value Ref Range   O2 Content 3.0 L/min   Delivery systems NASAL CANNULA    pH, Arterial 7.43 7.35 - 7.45   pCO2 arterial 36 32 - 48 mmHg   pO2, Arterial 69 (L) 83 - 108 mmHg   Bicarbonate 23.9 20.0 - 28.0 mmol/L   Acid-base deficit 0.1 0.0 - 2.0 mmol/L   O2 Saturation 97.3 %   Patient temperature 37.0     Collection site RIGHT RADIAL    Allens test (pass/fail) PASS PASS    Comment: Performed at [facility], [address] [place], [place] [address]  MRSA Next Gen by PCR, Nasal     Status: None   Collection Time: 08/31/22 12:15 AM   Specimen: Nasal Mucosa; Nasal Swab  Result Value Ref Range   MRSA by PCR Next Gen NOT DETECTED NOT DETECTED    Comment: (NOTE) The GeneXpert MRSA Assay (FDA approved for NASAL specimens only), is one component of a comprehensive MRSA colonization surveillance program. It is not intended to diagnose MRSA infection nor to guide or monitor treatment for MRSA infections. Test performance is not FDA approved in patients less than 96 years old. Performed at [facility], [address] [place], [place] [address]   Albumin, Pleural Fluid     Status: None   Collection Time: 08/31/22 12:15 AM  Result Value Ref Range   Albumin 3.5 3.5 - 5.0 g/dL    Comment: Performed at [facility], [address] [place], [place] [address]  Lactic acid, plasma     Status: None   Collection Time: 08/31/22 12:15 AM  Result Value Ref Range   Lactic Acid, Venous 1.5 0.5 - 1.9 mmol/L    Comment: Performed at [facility], [address] [place], [place] [address]  HIV Antibody (routine testing w rflx)     Status: None   Collection Time: 08/31/22 12:15 AM  Result Value Ref Range   HIV Screen 4th Generation wRfx Non Reactive Non Reactive    Comment: Performed at [facility], [address], [place], [place] [address]  Pleural fluid culture w Gram Stain     Status: None   Collection Time: 08/31/22 12:35 AM   Specimen: Pleural Fluid  Result Value Ref Range   Specimen Description      PLEURAL Performed at [facility], [address] [place], [place] [address]    Special Requests      NONE Performed at [facility], [address] [place], [place] [address]    Gram Stain      MODERATE WBC PRESENT,  PREDOMINANTLY PMN NO ORGANISMS SEEN    Culture      NO GROWTH 3 DAYS Performed at [facility], [address] [place], [place] [address]    Report Status 09/03/2022 FINAL   CBG monitoring, ED     Status: Abnormal   Collection Time: 08/31/22  2:18 AM  Result Value Ref Range   Glucose-Capillary 125 (H) 70 - 99 mg/dL    Comment: Glucose reference range applies only to samples taken after fasting for at least 8 hours.  Strep pneumoniae urinary antigen     Status: None   Collection Time: 08/31/22  2:20 AM  Result Value Ref Range   Strep Pneumo Urinary Antigen NEGATIVE NEGATIVE    Comment:        Infection due to S. pneumoniae cannot be absolutely ruled out since the antigen present may be below the detection limit of the test. Performed at [facility], [address], [place], [place] [address]   Legionella Pneumophila Serogp 1 Ur Ag     Status: None   Collection Time: 08/31/22  2:20 AM  Result Value Ref Range   L. pneumophila Serogp 1 Ur Ag Negative Negative    Comment: (NOTE) Presumptive negative for L. pneumophila serogroup 1 antigen in urine, suggesting no recent or current infection. Legionnaires' disease cannot be ruled out since other serogroups and species may also cause disease. Performed At: [facility] [address] [place], [place] [id number] Jolene Schimke MD YT:[id number]    Source of Sample URINE, RANDOM     Comment: Performed at [facility], [address] [place], [place]  [id number]  Urinalysis, Routine w reflex microscopic     Status: Abnormal   Collection Time: 08/31/22  2:20 AM  Result Value Ref Range   Color, Urine YELLOW (A) YELLOW   APPearance CLEAR (A) CLEAR   Specific Gravity, Urine 1.032 (H) 1.005 - 1.030   pH 5.0 5.0 - 8.0   Glucose, UA NEGATIVE NEGATIVE mg/dL   Hgb urine dipstick NEGATIVE NEGATIVE   Bilirubin Urine NEGATIVE NEGATIVE   Ketones, ur NEGATIVE NEGATIVE mg/dL   Protein, ur NEGATIVE NEGATIVE mg/dL    Nitrite NEGATIVE NEGATIVE   Leukocytes,Ua NEGATIVE NEGATIVE    Comment: Performed at [facility], [address] [place], [place] [address]  CBC with Differential/Platelet     Status: Abnormal   Collection Time: 08/31/22  2:55 AM  Result Value Ref Range   WBC 22.1 (H) 4.0 - 10.5 K/uL   RBC 4.78 4.22 - 5.81 MIL/uL   Hemoglobin 14.0 13.0 - 17.0 g/dL   HCT 82.941.9 56.239.0 - 13.052.0 %   MCV 87.7 80.0 - 100.0 fL   MCH 29.3 26.0 - 34.0 pg   MCHC 33.4 30.0 - 36.0 g/dL   RDW 86.512.9 78.411.5 - 69.615.5 %   Platelets 288 150 - 400 K/uL   nRBC 0.0 0.0 - 0.2 %   Neutrophils Relative % 87 %   Neutro Abs 19.1 (H) 1.7 - 7.7 K/uL   Lymphocytes Relative 4 %   Lymphs Abs 0.9 0.7 - 4.0 K/uL   Monocytes Relative 8 %   Monocytes Absolute 1.8 (H) 0.1 - 1.0 K/uL   Eosinophils Relative 0 %   Eosinophils Absolute 0.0 0.0 - 0.5 K/uL   Basophils Relative 0 %   Basophils Absolute 0.1 0.0 - 0.1 K/uL   Immature Granulocytes 1 %   Abs Immature Granulocytes 0.18 (H) 0.00 - 0.07 K/uL    Comment: Performed at [facility], [address] [place], [place] [address]  Comprehensive metabolic panel     Status: Abnormal   Collection Time: 08/31/22  2:55 AM  Result Value Ref Range   Sodium 135 135 - 145 mmol/L   Potassium 3.7 3.5 - 5.1 mmol/L   Chloride 106 98 - 111 mmol/L   CO2 21 (L) 22 - 32 mmol/L   Glucose, Bld 116 (H) 70 - 99 mg/dL    Comment: Glucose reference range applies only to samples taken after fasting for at least 8 hours.   BUN 15 6 - 20 mg/dL   Creatinine, Ser 4.130.78 0.61 - 1.24 mg/dL   Calcium 7.8 (L) 8.9 - 10.3 mg/dL   Total Protein 6.1 (L) 6.5 - 8.1 g/dL   Albumin 3.1 (L) 3.5 - 5.0 g/dL   AST 21 15 - 41 U/L   ALT 46 (H) 0 - 44 U/L   Alkaline Phosphatase 70 38 - 126 U/L   Total Bilirubin 2.0 (H) 0.3 - 1.2 mg/dL   GFR, Estimated >24>60 >40>60 mL/min    Comment: (NOTE) Calculated using the CKD-EPI Creatinine Equation (2021)    Anion gap 8 5 - 15    Comment: Performed at [facility], [address] [place], [place] [address]  Hemoglobin A1c     Status: Abnormal   Collection Time: 08/31/22  2:55 AM  Result Value Ref Range   Hgb A1c MFr Bld 4.7 (L) 4.8 - 5.6 %    Comment: (NOTE) Pre diabetes:          5.7%-6.4%  Diabetes:              >  6.4%  Glycemic control for   <7.0% adults with diabetes    Mean Plasma Glucose 88.19 mg/dL    Comment: Performed at [facility], [address] [place], [place] [address]  Lactic acid, plasma     Status: None   Collection Time: 08/31/22  2:55 AM  Result Value Ref Range   Lactic Acid, Venous 1.1 0.5 - 1.9 mmol/L    Comment: Performed at [facility], [address] [place], [place] [address]  Magnesium     Status: Abnormal   Collection Time: 08/31/22  2:55 AM  Result Value Ref Range   Magnesium 1.4 (L) 1.7 - 2.4 mg/dL    Comment: Performed at [facility], [address] [place], [place] [address]  Phosphorus     Status: Abnormal   Collection Time: 08/31/22  2:55 AM  Result Value Ref Range   Phosphorus 2.4 (L) 2.5 - 4.6 mg/dL    Comment: Performed at [facility], [address] [place], [place] [address]  Procalcitonin - Baseline     Status: None   Collection Time: 08/31/22  2:55 AM  Result Value Ref Range   Procalcitonin 2.15 ng/mL    Comment:        Interpretation: PCT > 2 ng/mL: Systemic infection (sepsis) is likely, unless other causes are known. (NOTE)       Sepsis PCT Algorithm           Lower Respiratory Tract                                      Infection PCT Algorithm    ----------------------------     ----------------------------         PCT < 0.25 ng/mL                PCT < 0.10 ng/mL          Strongly encourage             Strongly discourage   discontinuation of antibiotics    initiation of antibiotics    ----------------------------     -----------------------------       PCT 0.25 - 0.50 ng/mL            PCT 0.10 - 0.25 ng/mL               OR       >80%  decrease in PCT            Discourage initiation of                                            antibiotics      Encourage discontinuation           of antibiotics    ----------------------------     -----------------------------         PCT >= 0.50 ng/mL              PCT 0.26 - 0.50 ng/mL               AND       <80% decrease in PCT              Encourage initiation of  antibiotics       Encourage continuation           of antibiotics    ----------------------------     -----------------------------        PCT >= 0.50 ng/mL                  PCT > 0.50 ng/mL               AND         increase in PCT                  Strongly encourage                                      initiation of antibiotics    Strongly encourage escalation           of antibiotics                                     -----------------------------                                           PCT <= 0.25 ng/mL                                                 OR                                        > 80% decrease in PCT                                      Discontinue / Do not initiate                                             antibiotics  Performed at [facility], [address] [place], [place] [address]   CBG monitoring, ED     Status: Abnormal   Collection Time: 08/31/22  4:41 AM  Result Value Ref Range   Glucose-Capillary 122 (H) 70 - 99 mg/dL    Comment: Glucose reference range applies only to samples taken after fasting for at least 8 hours.   Comment 1 Notify RN    Comment 2 Document in Chart   Glucose, capillary     Status: Abnormal   Collection Time: 08/31/22  5:54 AM  Result Value Ref Range   Glucose-Capillary 120 (H) 70 - 99 mg/dL    Comment: Glucose reference range applies only to samples taken after fasting for at least 8 hours.  Glucose, capillary     Status: Abnormal   Collection Time: 08/31/22  7:42 AM  Result Value Ref Range    Glucose-Capillary 118 (H) 70 - 99 mg/dL    Comment: Glucose reference range applies only to  samples taken after fasting for at least 8 hours.  Glucose, capillary     Status: Abnormal   Collection Time: 08/31/22 11:22 AM  Result Value Ref Range   Glucose-Capillary 135 (H) 70 - 99 mg/dL    Comment: Glucose reference range applies only to samples taken after fasting for at least 8 hours.   Comment 1 Notify RN   Glucose, capillary     Status: Abnormal   Collection Time: 08/31/22  4:09 PM  Result Value Ref Range   Glucose-Capillary 112 (H) 70 - 99 mg/dL    Comment: Glucose reference range applies only to samples taken after fasting for at least 8 hours.  Glucose, capillary     Status: Abnormal   Collection Time: 08/31/22  7:22 PM  Result Value Ref Range   Glucose-Capillary 123 (H) 70 - 99 mg/dL    Comment: Glucose reference range applies only to samples taken after fasting for at least 8 hours.  Glucose, capillary     Status: Abnormal   Collection Time: 08/31/22 11:07 PM  Result Value Ref Range   Glucose-Capillary 144 (H) 70 - 99 mg/dL    Comment: Glucose reference range applies only to samples taken after fasting for at least 8 hours.  Glucose, capillary     Status: Abnormal   Collection Time: 09/01/22  3:30 AM  Result Value Ref Range   Glucose-Capillary 116 (H) 70 - 99 mg/dL    Comment: Glucose reference range applies only to samples taken after fasting for at least 8 hours.  CBC     Status: Abnormal   Collection Time: 09/01/22  5:10 AM  Result Value Ref Range   WBC 24.3 (H) 4.0 - 10.5 K/uL   RBC 4.75 4.22 - 5.81 MIL/uL   Hemoglobin 13.9 13.0 - 17.0 g/dL   HCT 16.1 09.6 - 04.5 %   MCV 86.5 80.0 - 100.0 fL   MCH 29.3 26.0 - 34.0 pg   MCHC 33.8 30.0 - 36.0 g/dL   RDW 40.9 81.1 - 91.4 %   Platelets 277 150 - 400 K/uL   nRBC 0.0 0.0 - 0.2 %    Comment: Performed at [facility], [address], [place], [place] [address]  Basic metabolic panel     Status: Abnormal    Collection Time: 09/01/22  5:10 AM  Result Value Ref Range   Sodium 130 (L) 135 - 145 mmol/L   Potassium 4.1 3.5 - 5.1 mmol/L   Chloride 98 98 - 111 mmol/L   CO2 23 22 - 32 mmol/L   Glucose, Bld 159 (H) 70 - 99 mg/dL    Comment: Glucose reference range applies only to samples taken after fasting for at least 8 hours.   BUN 15 6 - 20 mg/dL   Creatinine, Ser 6.21 0.61 - 1.24 mg/dL   Calcium 8.4 (L) 8.9 - 10.3 mg/dL   GFR, Estimated >30 >86 mL/min    Comment: (NOTE) Calculated using the CKD-EPI Creatinine Equation (2021)    Anion gap 9 5 - 15    Comment: Performed at [facility], [address] [place], [place] [address]  Magnesium     Status: None   Collection Time: 09/01/22  5:10 AM  Result Value Ref Range   Magnesium 2.0 1.7 - 2.4 mg/dL    Comment: Performed at [facility], [address] [place], [place] [address]  Phosphorus     Status: Abnormal   Collection Time: 09/01/22  5:10 AM  Result Value Ref Range   Phosphorus 2.4 (L) 2.5 - 4.6 mg/dL    Comment: Performed at [facility], [address] [place], [place] [address]  Glucose, capillary     Status: Abnormal   Collection Time: 09/01/22  7:37 AM  Result Value Ref Range   Glucose-Capillary 110 (H) 70 - 99 mg/dL    Comment: Glucose reference range applies only to samples taken after fasting for at least 8 hours.  Glucose, capillary     Status: Abnormal   Collection Time: 09/01/22 11:12 AM  Result Value Ref Range   Glucose-Capillary 111 (H) 70 - 99 mg/dL    Comment: Glucose reference range applies only to samples taken after fasting for at least 8 hours.  Glucose, capillary     Status: Abnormal   Collection Time: 09/01/22  7:27 PM  Result Value Ref Range   Glucose-Capillary 115 (H) 70 - 99 mg/dL    Comment: Glucose reference range applies only to samples taken after fasting for at least 8 hours.  Glucose, capillary     Status: Abnormal   Collection Time: 09/02/22 12:07 AM  Result Value  Ref Range   Glucose-Capillary 145 (H) 70 - 99 mg/dL    Comment: Glucose reference range applies only to samples taken after fasting for at least 8 hours.  Glucose, capillary     Status: Abnormal   Collection Time: 09/02/22  3:46 AM  Result Value Ref Range   Glucose-Capillary 150 (H) 70 - 99 mg/dL    Comment: Glucose reference range applies only to samples taken after fasting for at least 8 hours.  Glucose, capillary     Status: None   Collection Time: 09/02/22  7:31 AM  Result Value Ref Range   Glucose-Capillary 97 70 - 99 mg/dL    Comment: Glucose reference range applies only to samples taken after fasting for at least 8 hours.  Glucose, capillary     Status: Abnormal   Collection Time: 09/02/22 11:17 AM  Result Value Ref Range   Glucose-Capillary 139 (H) 70 - 99 mg/dL    Comment: Glucose reference range applies only to samples taken after fasting for at least 8 hours.  Glucose, capillary     Status: Abnormal   Collection Time: 09/02/22  3:26 PM  Result Value Ref Range   Glucose-Capillary 151 (H) 70 - 99 mg/dL    Comment: Glucose reference range applies only to samples taken after fasting for at least 8 hours.  Glucose, capillary     Status: Abnormal   Collection Time: 09/02/22  7:10 PM  Result Value Ref Range   Glucose-Capillary 149 (H) 70 - 99 mg/dL    Comment: Glucose reference range applies only to samples taken after fasting for at least 8 hours.  Comprehensive metabolic panel     Status: Abnormal   Collection Time: 09/03/22  1:06 AM  Result Value Ref Range   Sodium 133 (L) 135 - 145 mmol/L   Potassium 4.1 3.5 - 5.1 mmol/L   Chloride 101 98 - 111 mmol/L   CO2 24 22 - 32 mmol/L   Glucose, Bld 131 (H) 70 - 99 mg/dL    Comment: Glucose reference range applies only to samples taken after fasting for at least 8 hours.   BUN 10 6 - 20 mg/dL   Creatinine, Ser 0.72 0.61 - 1.24 mg/dL   Calcium 8.0 (L) 8.9 - 10.3 mg/dL   Total Protein 5.4 (L) 6.5 - 8.1 g/dL   Albumin  2.1 (L) 3.5  - 5.0 g/dL   AST 38 15 - 41 U/L   ALT 49 (H) 0 - 44 U/L   Alkaline Phosphatase 80 38 - 126 U/L   Total Bilirubin 0.4 0.3 - 1.2 mg/dL   GFR, Estimated >16 >10 mL/min    Comment: (NOTE) Calculated using the CKD-EPI Creatinine Equation (2021)    Anion gap 8 5 - 15    Comment: Performed at [facility], [address], [place], [place] [address]  CBC     Status: Abnormal   Collection Time: 09/03/22  1:06 AM  Result Value Ref Range   WBC 14.6 (H) 4.0 - 10.5 K/uL   RBC 4.08 (L) 4.22 - 5.81 MIL/uL   Hemoglobin 12.7 (L) 13.0 - 17.0 g/dL   HCT 40.9 (L) 81.1 - 91.4 %   MCV 86.5 80.0 - 100.0 fL   MCH 31.1 26.0 - 34.0 pg   MCHC 36.0 30.0 - 36.0 g/dL   RDW 78.2 95.6 - 21.3 %   Platelets 273 150 - 400 K/uL   nRBC 0.0 0.0 - 0.2 %    Comment: Performed at [facility], [address], [place], [place] [address]  CBC     Status: Abnormal   Collection Time: 09/04/22  1:17 AM  Result Value Ref Range   WBC 12.1 (H) 4.0 - 10.5 K/uL   RBC 4.08 (L) 4.22 - 5.81 MIL/uL   Hemoglobin 12.1 (L) 13.0 - 17.0 g/dL   HCT 84.6 (L) 96.2 - 95.2 %   MCV 88.7 80.0 - 100.0 fL   MCH 29.7 26.0 - 34.0 pg   MCHC 33.4 30.0 - 36.0 g/dL   RDW 84.1 32.4 - 40.1 %   Platelets 302 150 - 400 K/uL   nRBC 0.0 0.0 - 0.2 %    Comment: Performed at [facility], [address] [place], [place] [address]  Basic metabolic panel     Status: Abnormal   Collection Time: 09/06/22  1:58 AM  Result Value Ref Range   Sodium 137 135 - 145 mmol/L   Potassium 3.5 3.5 - 5.1 mmol/L   Chloride 104 98 - 111 mmol/L   CO2 24 22 - 32 mmol/L   Glucose, Bld 135 (H) 70 - 99 mg/dL    Comment: Glucose reference range applies only to samples taken after fasting for at least 8 hours.   BUN 8 6 - 20 mg/dL   Creatinine, Ser 3.66 0.61 - 1.24 mg/dL   Calcium 8.0 (L) 8.9 - 10.3 mg/dL   GFR, Estimated >44 >03 mL/min    Comment: (NOTE) Calculated using the CKD-EPI Creatinine Equation (2021)    Anion gap 9 5 - 15    Comment:  Performed at [facility], [address] [place], [place] [address]  CBC     Status: Abnormal   Collection Time: 09/06/22  1:58 AM  Result Value Ref Range   WBC 10.1 4.0 - 10.5 K/uL   RBC 3.91 (L) 4.22 - 5.81 MIL/uL   Hemoglobin 11.8 (L) 13.0 - 17.0 g/dL   HCT 95.6 (L) 38.7 - 56.4 %   MCV 86.7 80.0 - 100.0 fL   MCH 30.2 26.0 - 34.0 pg   MCHC 34.8 30.0 - 36.0 g/dL   RDW 33.2 95.1 - 88.4 %   Platelets 341 150 - 400 K/uL   nRBC 0.0 0.0 - 0.2 %    Comment: Performed at [facility], [address], [place], [place] [address]  CBC with Differential/Platelet     Status: Abnormal   Collection Time: 09/07/22  1:12 AM  Result Value Ref Range   WBC 11.2 (H) 4.0 - 10.5 K/uL   RBC 4.19 (L) 4.22 - 5.81 MIL/uL   Hemoglobin 12.5 (L) 13.0 - 17.0 g/dL   HCT 60.4 (L) 54.0 - 98.1 %   MCV 87.8 80.0 - 100.0 fL   MCH 29.8 26.0 - 34.0 pg   MCHC 34.0 30.0 - 36.0 g/dL   RDW 19.1 47.8 - 29.5 %   Platelets 378 150 - 400 K/uL   nRBC 0.0 0.0 - 0.2 %   Neutrophils Relative % 66 %   Neutro Abs 7.4 1.7 - 7.7 K/uL   Lymphocytes Relative 19 %   Lymphs Abs 2.1 0.7 - 4.0 K/uL   Monocytes Relative 7 %   Monocytes Absolute 0.8 0.1 - 1.0 K/uL   Eosinophils Relative 5 %   Eosinophils Absolute 0.6 (H) 0.0 - 0.5 K/uL   Basophils Relative 1 %   Basophils Absolute 0.1 0.0 - 0.1 K/uL   Immature Granulocytes 2 %   Abs Immature Granulocytes 0.22 (H) 0.00 - 0.07 K/uL    Comment: Performed at [facility], [address], [place], [place] [address]  Comprehensive metabolic panel     Status: Abnormal   Collection Time: 09/07/22  1:12 AM  Result Value Ref Range   Sodium 140 135 - 145 mmol/L   Potassium 4.0 3.5 - 5.1 mmol/L   Chloride 110 98 - 111 mmol/L   CO2 24 22 - 32 mmol/L   Glucose, Bld 113 (H) 70 - 99 mg/dL    Comment: Glucose reference range applies only to samples taken after fasting for at least 8 hours.   BUN 5 (L) 6 - 20 mg/dL   Creatinine, Ser 8.65 0.61 - 1.24 mg/dL   Calcium 8.5 (L)  8.9 - 10.3 mg/dL   Total Protein 5.2 (L) 6.5 - 8.1 g/dL   Albumin 2.3 (L) 3.5 - 5.0 g/dL   AST 28 15 - 41 U/L   ALT 39 0 - 44 U/L   Alkaline Phosphatase 69 38 - 126 U/L   Total Bilirubin 0.6 0.3 - 1.2 mg/dL   GFR, Estimated >78 >46 mL/min    Comment: (NOTE) Calculated using the CKD-EPI Creatinine Equation (2021)    Anion gap 6 5 - 15    Comment: Performed at [facility], [address], [place], [place] [address]  Magnesium     Status: None   Collection Time: 09/07/22  1:12 AM  Result Value Ref Range   Magnesium 2.0 1.7 - 2.4 mg/dL    Comment: Performed at [facility], [address], [place], [place] [address]  Phosphorus     Status: None   Collection Time: 09/07/22  1:12 AM  Result Value Ref Range   Phosphorus 4.0 2.5 - 4.6 mg/dL    Comment: Performed at [facility], [address], [place], [place] [address]  TSH     Status: None   Collection Time: 09/21/22  2:45 PM  Result Value Ref Range   TSH 1.94 0.35 - 5.50 uIU/mL  Magnesium     Status: None   Collection Time: 09/21/22  2:45 PM  Result Value Ref Range   Magnesium 2.0 1.5 - 2.5 mg/dL  Phosphorus     Status: None   Collection Time: 09/21/22  2:45 PM  Result Value Ref Range   Phosphorus 3.3 2.3 - 4.6 mg/dL  Lipid panel     Status: Abnormal   Collection Time: 09/21/22  2:45 PM  Result Value Ref Range   Cholesterol 209 (H) 0 - 200 mg/dL    Comment: ATP III Classification       Desirable:  < 200 mg/dL               Borderline High:  200 - 239 mg/dL          High:  > = 161 mg/dL   Triglycerides 096.0 (H) 0.0 - 149.0 mg/dL    Comment: Normal:  <454 mg/dLBorderline High:  150 - 199 mg/dL   HDL 09.81 (L) >19.14 mg/dL   VLDL 78.2 (H) 0.0 - 95.6 mg/dL   Total CHOL/HDL Ratio 7     Comment:                Men          Women1/2 Average Risk     3.4          3.3Average Risk          5.0          4.42X Average Risk          9.6          7.13X Average Risk          15.0          11.0                        NonHDL 177.86     Comment: NOTE:  Non-HDL goal should be 30 mg/dL higher than patient's LDL goal (i.e. LDL goal of < 70 mg/dL, would have non-HDL goal of < 100 mg/dL)  Comprehensive metabolic panel     Status: None   Collection Time: 09/21/22  2:45 PM  Result Value Ref Range   Sodium 138 135 - 145 mEq/L   Potassium 4.1 3.5 - 5.1 mEq/L   Chloride 101 96 - 112 mEq/L   CO2 31 19 - 32 mEq/L   Glucose, Bld 81 70 - 99 mg/dL   BUN 11 6 - 23 mg/dL   Creatinine, Ser 2.13 0.40 - 1.50 mg/dL   Total Bilirubin 0.5 0.2 - 1.2 mg/dL   Alkaline Phosphatase 93 39 - 117 U/L   AST 17 0 - 37 U/L   ALT 19 0 - 53 U/L   Total Protein 7.2 6.0 - 8.3 g/dL   Albumin 4.4 3.5 - 5.2 g/dL   GFR 086.57 >84.69 mL/min    Comment: Calculated using the CKD-EPI Creatinine Equation (2021)   Calcium 9.5 8.4 - 10.5 mg/dL  CBC     Status: None   Collection Time: 09/21/22  2:45 PM  Result Value Ref Range   WBC 8.4 4.0 - 10.5 K/uL   RBC 5.56 4.22 - 5.81 Mil/uL   Platelets 322.0 150.0 - 400.0 K/uL   Hemoglobin 16.3 13.0 - 17.0 g/dL   HCT 62.9 52.8 - 41.3 %   MCV 84.8 78.0 - 100.0 fl   MCHC 34.6 30.0 - 36.0 g/dL   RDW 24.4 01.0 - 27.2 %  LDL cholesterol, direct     Status: None   Collection Time: 09/21/22  2:45 PM  Result Value Ref Range   Direct LDL 140.0 mg/dL    Comment: Optimal:  <536 mg/dLNear or Above Optimal:  100-129  mg/dLBorderline High:  130-159 mg/dLHigh:  160-189 mg/dLVery High:  >190 mg/dL  Hepatitis C Antibody     Status: None   Collection Time: 09/21/22  2:47 PM  Result Value Ref Range   Hepatitis C Ab NON-REACTIVE NON-REACTIVE    Comment: . HCV antibody was non-reactive. There is no laboratory  evidence of HCV infection. . In most cases, no further action is required. However, if recent HCV exposure is suspected, a test for HCV RNA (test code [address]) is suggested. . For additional information please refer to http://education.[facility].com/faq/FAQ22v1 (This link is being provided for  informational/ educational purposes only.) .   Drug Screen, 5 Panel, Ur     Status: None   Collection Time: 10/18/22  2:26 PM  Result Value Ref Range   Amphetamines, Urine Negative Cutoff=1000 ng/mL    Comment: Amphetamine test includes Amphetamine and Methamphetamine.   Cannabinoid Quant, Ur Negative Cutoff=50 ng/mL   Cocaine (Metab.) Negative Cutoff=300 ng/mL   OPIATE QUANTITATIVE URINE Negative Cutoff=2000 ng/mL    Comment: Opiate test includes Codeine and Morphine only.   PCP Quant, Ur Negative Cutoff=25 ng/mL          Signed: Lula Olszewski, MD 11/24/2022 11:15 AM

## 2022-11-24 NOTE — Assessment & Plan Note (Signed)
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  Encouraged him to continue Wellbutrin and Zoloft as well we will follow-up in just 2 weeks to make sure the Abilify is working for him

## 2022-11-24 NOTE — Assessment & Plan Note (Signed)
He has been unable to get nicotrol due to backorder, will send to different pharmacy Encouraged call [phone]

## 2022-11-24 NOTE — Assessment & Plan Note (Signed)
Indication for controlled substance: opioid depenence Medication and dose: see associated problem and med list # pills per month: 12 mg daily Last UDS date: 10/18/22 Controlled substance contract signed (Y/N): Yes, scanned  PDMP last reviewed (include red flags): PDMP reviewed during this encounter.  Diversion Prevention:  have repeatedly warned of risk of discontinuation with legal or substance use issues, signed contract, intermittent random drug screens and pill counts Morphine equivalents: not available   Interim history: doing well on current dose

## 2022-11-27 ENCOUNTER — Telehealth: Payer: BLUE CROSS/BLUE SHIELD | Admitting: Internal Medicine

## 2022-11-27 NOTE — Telephone Encounter (Signed)
This was sent in on 1/19. Left vm for patient to call the office or send a my chart message to confirm receipt of this.

## 2022-12-08 ENCOUNTER — Telehealth (INDEPENDENT_AMBULATORY_CARE_PROVIDER_SITE_OTHER): Payer: Self-pay | Admitting: Internal Medicine

## 2022-12-08 DIAGNOSIS — Z91199 Patient's noncompliance with other medical treatment and regimen due to unspecified reason: Secondary | ICD-10-CM

## 2022-12-08 NOTE — Progress Notes (Signed)
Called 2x for [employer] Visit (Video Appointment), straight to voicemail box that is not set up to leave message At 805 Patient not in [employer] Visit (Video Appointment) as  scheduled to complete appointment. Around 815 I saw that he got in to the [employer] Visit (Video Appointment) but when I connected to it the appointment suddenly hung up. I tried calling contact number again and sent straight to non setup voicemail  Patient did not call the office for remainder of day so we weren't able to provide any service today.

## 2022-12-16 ENCOUNTER — Other Ambulatory Visit: Payer: Self-pay | Admitting: Internal Medicine

## 2022-12-16 DIAGNOSIS — J42 Unspecified chronic bronchitis: Secondary | ICD-10-CM

## 2023-01-25 ENCOUNTER — Telehealth: Payer: Self-pay | Admitting: Internal Medicine

## 2023-01-25 NOTE — Telephone Encounter (Signed)
Pt states needs OV for refill of suboxene. Earliest visit is 3/27 @ 3pm, pt has been scheduled. Pt wants to know if enough medication could be called in to hold him over until then. Please advise.

## 2023-01-31 ENCOUNTER — Ambulatory Visit: Payer: BLUE CROSS/BLUE SHIELD | Admitting: Internal Medicine

## 2023-01-31 NOTE — Telephone Encounter (Signed)
Spoke with patient and he informed me that he does not currently have insurance, so he had to cancel his appointment for today at 3 pm.

## 2023-02-05 ENCOUNTER — Other Ambulatory Visit: Payer: Self-pay | Admitting: Internal Medicine

## 2023-02-05 ENCOUNTER — Ambulatory Visit: Payer: Medicaid Other | Admitting: Internal Medicine

## 2023-02-05 VITALS — BP 124/86 | HR 73 | Temp 98.2°F | Ht 72.0 in | Wt 186.6 lb

## 2023-02-05 DIAGNOSIS — D649 Anemia, unspecified: Secondary | ICD-10-CM

## 2023-02-05 DIAGNOSIS — J410 Simple chronic bronchitis: Secondary | ICD-10-CM | POA: Diagnosis not present

## 2023-02-05 DIAGNOSIS — F419 Anxiety disorder, unspecified: Secondary | ICD-10-CM

## 2023-02-05 DIAGNOSIS — Z419 Encounter for procedure for purposes other than remedying health state, unspecified: Secondary | ICD-10-CM | POA: Diagnosis not present

## 2023-02-05 DIAGNOSIS — Z789 Other specified health status: Secondary | ICD-10-CM | POA: Diagnosis not present

## 2023-02-05 DIAGNOSIS — J42 Unspecified chronic bronchitis: Secondary | ICD-10-CM

## 2023-02-05 DIAGNOSIS — E785 Hyperlipidemia, unspecified: Secondary | ICD-10-CM

## 2023-02-05 DIAGNOSIS — B351 Tinea unguium: Secondary | ICD-10-CM | POA: Diagnosis not present

## 2023-02-05 DIAGNOSIS — F1129 Opioid dependence with unspecified opioid-induced disorder: Secondary | ICD-10-CM

## 2023-02-05 MED ORDER — CICLOPIROX OLAMINE 0.77 % EX CREA
TOPICAL_CREAM | Freq: Two times a day (BID) | CUTANEOUS | 0 refills | Status: DC
Start: 2023-02-05 — End: 2023-04-03

## 2023-02-05 MED ORDER — BUPRENORPHINE HCL-NALOXONE HCL 8-2 MG SL SUBL: 1.5000 | SUBLINGUAL_TABLET | Freq: Every day | SUBLINGUAL | 0 refills | Status: DC

## 2023-02-05 NOTE — Assessment & Plan Note (Signed)
Encouraged patient to call 211 to help with social determinants of health recovery

## 2023-02-05 NOTE — Progress Notes (Signed)
[name]: [id number]   Routine Medical Office Visit  Patient:  Carl Le      Age: 42 y.o.       Sex:  male  Date:   02/05/2023  PCP:    Loralee Pacas, MD   [facility] Provider: Loralee Pacas, MD   Assessment and Plan:   Onychomycosis -     Ciclopirox Olamine; Apply topically 2 (two) times daily.  Dispense: 15 g; Refill: 0  Opioid dependence with opioid-induced disorder -     Buprenorphine HCl-Naloxone HCl; Place 1.5 tablets under the tongue daily for 28 days.  Dispense: 42 tablet; Refill: 0 -     Hepatitis C antibody -     HIV Antibody (routine testing w rflx)  History of incarceration Assessment & Plan: Encouraged patient to call 211 to help with social determinants of health recovery  Orders: -     Hepatitis C antibody -     HIV Antibody (routine testing w rflx)  Anxiety disorder, unspecified type -     TSH  Hyperlipidemia, unspecified hyperlipidemia type -     Lipid panel -     LDL cholesterol, direct  Normocytic anemia -     CBC -     Comprehensive metabolic panel  Simple chronic bronchitis -     CBC -     Comprehensive metabolic panel       Clinical Presentation:   42 y.o. male here today for Medication Refill (Suboxone)  Lost insurance and ran out due to being incarcerated since last visit  Wants bloodwork due to getting tattoo and wants to follow up hyperlipidemia     Reviewed:  has a past medical history of Allergy, Anxiety disorder (09/21/2022), Chest pain (09/21/2022), Community acquired pneumonia of right lower lobe of lung (08/30/2022), Current smoker, Depression, Disability examination (09/21/2022), Herpes zoster, High risk medication use (10/26/2022), Kidney calculus, Kidney stone (04/15/2014), Mild emphysema (08/30/2022), Normocytic anemia (09/07/2022), Parapneumonic effusion (08/31/2022), Pneumonia (2023), Tobacco dependence (09/21/2022), and Tobacco use (09/02/2022). Active Ambulatory Problems     Diagnosis Date Noted   Anxiety disorder 09/21/2022   Nicotine dependence 09/21/2022   Chronic bronchitis 10/05/2022   Opioid dependence 10/18/2022   High risk medication use 10/26/2022   History of incarceration 10/26/2022   Bipolar 1 disorder 11/24/2022   Resolved Ambulatory Problems    Diagnosis Date Noted   Community acquired pneumonia of right lower lobe of lung 08/30/2022   Kidney stone 04/15/2014   Sinus tachycardia 08/30/2022   LFT elevation 08/30/2022   Hyperglycemia 08/30/2022   Sepsis without acute organ dysfunction 08/31/2022   Parapneumonic effusion 08/31/2022   Hyponatremia 09/02/2022   Acute respiratory failure with hypoxia 09/02/2022   Tobacco use 09/02/2022   Normocytic anemia 09/07/2022   Disability examination 09/21/2022   Chest pain 09/21/2022   Past Medical History:  Diagnosis Date   Allergy    Current smoker    Depression    Herpes zoster    Kidney calculus    Mild emphysema 08/30/2022   Pneumonia 2023   Tobacco dependence 09/21/2022    Outpatient Medications Prior to Visit  Medication Sig   acetaminophen (TYLENOL) 325 MG tablet Take 2 tablets (650 mg total) by mouth every 6 (six) hours as needed for mild pain (or Fever >/= 101).   albuterol (VENTOLIN HFA) 108 (90 Base) MCG/ACT inhaler TAKE 2 PUFFS BY MOUTH EVERY 6 HOURS AS NEEDED FOR WHEEZE OR SHORTNESS OF BREATH   ARIPiprazole (ABILIFY)  2 MG tablet Take 1 tablet (2 mg total) by mouth daily.   buPROPion (WELLBUTRIN XL) 300 MG 24 hr tablet Take 1 tablet (300 mg total) by mouth daily.   hydrOXYzine (ATARAX) 50 MG tablet Take 1 tablet (50 mg total) by mouth in the morning and at bedtime.   ibuprofen (ADVIL) 800 MG tablet Take 1 tablet (800 mg total) by mouth every 8 (eight) hours as needed.   nicotine (NICODERM CQ - DOSED IN MG/24 HOURS) 21 mg/24hr patch PLACE 1 PATCH ONTO THE SKIN DAILY.   nicotine (NICOTROL) 10 MG inhaler Inhale 1 Cartridge (1 continuous puffing total) into the lungs as needed for  smoking cessation.   nicotine polacrilex (NICORETTE) 4 MG gum Take 1 each (4 mg total) by mouth as needed for smoking cessation.   nicotine polacrilex (NICOTINE MINI) 4 MG lozenge Take 1 lozenge (4 mg total) by mouth as needed for smoking cessation.   oxymetazoline (AFRIN) 0.05 % nasal spray Place 1 spray into both nostrils daily as needed for congestion.   senna-docusate (SENOKOT-S) 8.6-50 MG tablet Take 1 tablet by mouth at bedtime as needed for mild constipation.   sertraline (ZOLOFT) 50 MG tablet Take 1 tablet (50 mg total) by mouth daily. Do not sudden stop Start at half tablet first 2 weeks   naloxone (NARCAN) nasal spray 4 mg/0.1 mL Place 1 spray into the nose once for 1 dose. Use for over sedation for anyone suspected of overdose.   [DISCONTINUED] buprenorphine-naloxone (SUBOXONE) 8-2 mg SUBL SL tablet Place 1.5 tablets under the tongue daily for 14 days.   No facility-administered medications prior to visit.    HPI  Updated and modified:  Problem  History of Incarceration   He self reported this was due to history of iatrogenic opioid use disorder that got out of control   Chronic Bronchitis   Has tried to stop smoking Has had severe pneumonia history   Anxiety Disorder   History hydroxyzine/valium in hospital... 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  Hurts to breath and hurts to cough. Pain severe- persistent. Prolonged hospitalization Chest pain is worsened by coughing Percocet was transitioned to suboxone and it has continued to improve.    Normocytic Anemia (Resolved)   Lab Results  Component Value Date/Time   HGB 16.3 09/21/2022 02:45 PM   HGB 12.5 (L) 09/07/2022 01:12 AM   HGB 11.8 (L) 09/06/2022 01:58  AM   HGB 12.1 (L) 09/04/2022 01:17 AM   HGB 12.7 (L) 09/03/2022 01:06 AM   HGB 15.8 05/11/2014 12:05 PM   HGB 16.3 04/11/2014 05:19 PM   HGB 16.9 04/05/2014 10:57 AM   HGB 15.0 02/27/2014 12:10 PM   Lab Results  Component Value Date/Time   MCV 84.8 09/21/2022 02:45 PM   MCV 87.8 09/07/2022 01:12 AM   MCV 86.7 09/06/2022 01:58 AM   MCV 88.7 09/04/2022 01:17 AM   MCV 86.5 09/03/2022 01:06 AM   MCV 89 05/11/2014 12:05 PM   MCV 91 04/11/2014 05:19 PM   MCV 90 04/05/2014 10:57 AM   MCV 90 02/27/2014 12:10 PM      Tobacco Use (Resolved)  Parapneumonic Effusion (Resolved)  Community Acquired Pneumonia of Right Lower Lobe of Lung (Resolved)   Discharged  0000000 Complicated by chest tube, o2 by [place] resp failure Source never found Persistent shortness of breath coughing and right-sided chest pain weeks after being discharged Some things had improved some things had worsened   Kidney Stone (Resolved)             Clinical Data Analysis:   Physical Exam  BP 124/86 (BP Location: Left Arm, Patient Position: Sitting)   Pulse 73   Temp 98.2 F (36.8 C) (Temporal)   Ht 6' (1.829 m)   Wt 186 lb 9.6 oz (84.6 kg)   SpO2 99%   BMI 25.31 kg/m  Wt Readings from Last 10 Encounters:  02/05/23 186 lb 9.6 oz (84.6 kg)  10/26/22 170 lb (77.1 kg)  10/18/22 180 lb 3.2 oz (81.7 kg)  09/21/22 166 lb 3.2 oz (75.4 kg)  09/03/22 176 lb 9.4 oz (80.1 kg)  08/30/22 170 lb (77.1 kg)   Vital signs reviewed.  Nursing notes reviewed. Weight trend reviewed. Abnormalities and Problem-Specific physical exam findings:  breathing comfortably and looks well today, new tattoo on R arm General Appearance:  No acute distress appreciable.   Well-groomed, healthy-appearing male.  Well proportioned with no abnormal fat distribution.  Good muscle tone. Skin: Clear and well-hydrated. Pulmonary:  Normal work of breathing at rest, no respiratory distress apparent. SpO2: 99 %  Musculoskeletal: Patient demonstrates  smooth and coordinated movements throughout all major joints.All extremities are intact.  Neurological:  Awake, alert, oriented, and engaged.  No obvious focal neurological deficits or cognitive impairments.  Sensorium seems unclouded. Gait is smooth and coordinated.  Speech is clear and coherent with logical content. Psychiatric:  Appropriate mood, pleasant and cooperative demeanor, cheerful and engaged during the exam    --------------------------------    Signed: Loralee Pacas, MD 02/05/2023 4:58 PM

## 2023-02-05 NOTE — Patient Instructions (Signed)
Please call 211 to assist with social determinants of health.

## 2023-02-06 LAB — CBC
HCT: 46.1 % (ref 39.0–52.0)
Hemoglobin: 15.8 g/dL (ref 13.0–17.0)
MCHC: 34.3 g/dL (ref 30.0–36.0)
MCV: 83.2 fl (ref 78.0–100.0)
Platelets: 258 10*3/uL (ref 150.0–400.0)
RBC: 5.54 Mil/uL (ref 4.22–5.81)
RDW: 13.8 % (ref 11.5–15.5)
WBC: 8.7 10*3/uL (ref 4.0–10.5)

## 2023-02-06 LAB — COMPREHENSIVE METABOLIC PANEL
ALT: 36 U/L (ref 0–53)
AST: 22 U/L (ref 0–37)
Albumin: 4.7 g/dL (ref 3.5–5.2)
Alkaline Phosphatase: 92 U/L (ref 39–117)
BUN: 14 mg/dL (ref 6–23)
CO2: 31 mEq/L (ref 19–32)
Calcium: 9.7 mg/dL (ref 8.4–10.5)
Chloride: 101 mEq/L (ref 96–112)
Creatinine, Ser: 1.04 mg/dL (ref 0.40–1.50)
GFR: 90.14 mL/min (ref >60.00)
Glucose, Bld: 92 mg/dL (ref 70–99)
Potassium: 4.2 mEq/L (ref 3.5–5.1)
Sodium: 137 mEq/L (ref 135–145)
Total Bilirubin: 0.4 mg/dL (ref 0.2–1.2)
Total Protein: 7.3 g/dL (ref 6.0–8.3)

## 2023-02-06 LAB — HIV ANTIBODY (ROUTINE TESTING W REFLEX): HIV 1&2 Ab, 4th Generation: NONREACTIVE

## 2023-02-06 LAB — LIPID PANEL
Cholesterol: 187 mg/dL (ref 0–200)
HDL: 36.1 mg/dL — ABNORMAL LOW (ref >39.00)
NonHDL: 151
Total CHOL/HDL Ratio: 5
Triglycerides: 312 mg/dL — ABNORMAL HIGH (ref 0.0–149.0)
VLDL: 62.4 mg/dL — ABNORMAL HIGH (ref 0.0–40.0)

## 2023-02-06 LAB — LDL CHOLESTEROL, DIRECT: Direct LDL: 106 mg/dL

## 2023-02-06 LAB — TSH: TSH: 3.58 u[IU]/mL (ref 0.35–5.50)

## 2023-02-06 LAB — HEPATITIS C ANTIBODY: Hepatitis C Ab: NONREACTIVE

## 2023-02-14 ENCOUNTER — Other Ambulatory Visit (HOSPITAL_COMMUNITY): Payer: Self-pay

## 2023-03-05 ENCOUNTER — Telehealth (INDEPENDENT_AMBULATORY_CARE_PROVIDER_SITE_OTHER): Payer: Medicaid Other | Admitting: Internal Medicine

## 2023-03-05 DIAGNOSIS — F419 Anxiety disorder, unspecified: Secondary | ICD-10-CM

## 2023-03-05 DIAGNOSIS — Z79899 Other long term (current) drug therapy: Secondary | ICD-10-CM | POA: Diagnosis not present

## 2023-03-05 DIAGNOSIS — F17299 Nicotine dependence, other tobacco product, with unspecified nicotine-induced disorders: Secondary | ICD-10-CM | POA: Diagnosis not present

## 2023-03-05 DIAGNOSIS — F319 Bipolar disorder, unspecified: Secondary | ICD-10-CM | POA: Diagnosis not present

## 2023-03-05 DIAGNOSIS — F1129 Opioid dependence with unspecified opioid-induced disorder: Secondary | ICD-10-CM | POA: Diagnosis not present

## 2023-03-05 MED ORDER — BUPRENORPHINE HCL-NALOXONE HCL 8-2 MG SL SUBL
1.5000 | SUBLINGUAL_TABLET | Freq: Every day | SUBLINGUAL | 0 refills | Status: DC
Start: 2023-03-05 — End: 2023-04-03

## 2023-03-05 NOTE — Progress Notes (Signed)
[facility]: [phone]   Virtual Medical Office Visit - Video Telemedicine   Patient:  Carl Le (1981-07-06) located at home MRN:   [id number]      Date:   03/05/2023  PCP:    Lula Olszewski, MD   Today's Healthcare Provider: Lula Olszewski, MD located at office: [facility] [address] [place] [address] Today's Telemedicine visit was conducted via Video for 76m 59s after consent for telemedicine was obtained:  Video connection was never lost couple moments of audio cutting out. All video encounter participant identities and locations confirmed visually and verbally.   Problem Focused Charting:   Medical Decision Making per Assessment/Plan   Carl Le was seen today for 1 month follow-up and opioid problem.  Opioid dependence with opioid-induced disorder (HCC) Overview: Interim history: per patient report, no relapse or even near relapse  Prior history: History of legal issues associated with opioid use disorder. Was hospitalized due to severe pneumonia with chest pain leading to resumption of percocet that made him fear return to opioid misuse Transitioned to suboxone rather than go through withdrawal 10/2022- probation officer concerned about the suboxone. Has been very forthcoming about this issue and concern  Orders: -     Buprenorphine HCl-Naloxone HCl; Place 1.5 tablets under the tongue daily for 28 days.  Dispense: 42 tablet; Refill: 0  High risk medication use Overview: Interim history:   He reports excellent adherence to his controlled substance medication regimen and compliance with expectations. There has been No evidence of misuse or diversion identified at this visit and no suspicion has been raised for such.  Indication for controlled substance: opioid use disorder  Medication and dose: 12 mg suboxone # pills per month: 42/28 days  Morphine equivalents: N/A for buprenorphine   Controlled  substance contract signed (Y/N): Yes, scanned I thought, but I cannot locate in our media files so needs to be recreated at next in-office visit.  He lives far from office so its a challenge. Urine Drug Screening Data:    Component Value Date/Time   COCAINSCRNUR Negative 10/18/2022 1426     Assessment & Plan: Controlled, stable Continue with same dose, 28 day supply, return to clinic 28 days Reviewed all elements of compliance, discovered we need a fresh contract at next visit.  PDMP last reviewed (include red flags): PDMP reviewed during this encounter.       Orders: -     Buprenorphine HCl-Naloxone HCl; Place 1.5 tablets under the tongue daily for 28 days.  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 hydroxyzine not helpful for dyspnea Was closely associated with severe pneumonia and dyspnea   Bipolar 1 disorder (HCC) Overview:  Prior history: History substance use disorder, and also history things like spending at thousand dollars on lottery tickets so this was speculative diagnosis for which we I resumed Abilify which he had self discontinued prior.          Treatment plan discussed and reviewed in detail. Explained medication safety and potential side effects. Agreed on patient returning to office if symptoms worsen, persist, or new symptoms develop. Discussed precautions  in case of needing to visit the Emergency Department. Answered all patient questions and confirmed understanding and comfort with the plan. Encouraged patient to contact our office if they have any  questions or concerns.    Subjective - Clinical Presentation:   Carl Le is a 41 y.o. birth-assigned male  Patient Active Problem List   Diagnosis Date Noted   Bipolar 1 disorder (HCC) 11/24/2022   High risk medication use 10/26/2022   History of incarceration 10/26/2022   Opioid dependence (HCC) 10/18/2022   Chronic bronchitis (HCC) 10/05/2022   Anxiety disorder 09/21/2022   Nicotine dependence 09/21/2022   Past Medical History:  Diagnosis Date   Allergy    Anxiety disorder 09/21/2022   Hydroxyzine failing Valium helped in hospital Being sure of breath has been really scary and makes him breathe harder and makes him feel sick with concern for respiratory failure so I agreed to additional Valium but warned him about the risk of addiction   Chest pain 09/21/2022   Severe associated with chest tube pneumonia with parapneumonic effusion.  Hurts to breath and hurts to cough. Pain severe- persistent.  Prolonged hospitalization  Chest pain is worsened by coughing  Percocet was transitioned to suboxone and it has continued to improve.      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  Has short term disability paperwork being done by another doc      Herpes zoster    High risk medication use 10/26/2022   Suboxone for opioid use disorder   Kidney calculus    Kidney stone 04/15/2014   Mild emphysema (HCC) 08/30/2022   noted on ct scan   Normocytic anemia 09/07/2022   Lab Results  Component  Value  Date/Time     HGB  16.3  09/21/2022 02:45 PM     HGB  12.5 (L)  09/07/2022 01:12 AM     HGB   11.8 (L)  09/06/2022 01:58 AM     HGB  12.1 (L)  09/04/2022 01:17 AM     HGB  12.7 (L)  09/03/2022 01:06 AM     HGB  15.8  05/11/2014 12:05 PM     HGB  16.3  04/11/2014 05:19 PM     HGB  16.9  04/05/2014 10:57 AM     HGB  15.0  02/27/2014 12:10 PM         Lab Results  Compone   Parapneumonic effusion 08/31/2022   Pneumonia 2023   Tobacco dependence 09/21/2022   Tobacco use 09/02/2022    Outpatient Medications Prior to Visit  Medication Sig   acetaminophen (TYLENOL) 325 MG tablet Take 2 tablets (650 mg total) by mouth every 6 (six) hours as needed for mild pain (or Fever >/= 101).   albuterol (VENTOLIN HFA) 108 (90 Base) MCG/ACT inhaler Inhale 1 puff into the lungs every 4 (four) hours as needed for wheezing or shortness of breath.   ARIPiprazole (ABILIFY) 2 MG tablet Take 1 tablet (2 mg total) by mouth daily.   buPROPion (WELLBUTRIN XL) 150 MG 24 hr tablet Take 150 mg  by mouth daily.   buPROPion (WELLBUTRIN XL) 300 MG 24 hr tablet Take 1 tablet (300 mg total) by mouth daily.   ciclopirox (LOPROX) 0.77 % cream Apply topically 2 (two) times daily.   hydrOXYzine (ATARAX) 50 MG tablet Take 1 tablet (50 mg total) by mouth in the morning and at bedtime.   ibuprofen (ADVIL) 800 MG tablet Take 1 tablet (800 mg total) by mouth every 8 (eight) hours as needed.   nicotine (NICODERM CQ - DOSED IN MG/24 HOURS) 21 mg/24hr patch PLACE 1 PATCH ONTO THE SKIN DAILY.   nicotine (NICOTROL) 10 MG inhaler Inhale 1 Cartridge (1 continuous puffing total) into the lungs as needed for smoking cessation.   nicotine polacrilex (NICORETTE) 4 MG gum Take 1 each (4 mg total) by mouth as needed for smoking cessation.   nicotine polacrilex (NICOTINE MINI) 4 MG lozenge Take 1 lozenge (4 mg total) by mouth as needed for smoking cessation.   oxymetazoline (AFRIN) 0.05 % nasal spray Place 1 spray into both nostrils daily as needed for congestion.   senna-docusate (SENOKOT-S) 8.6-50 MG tablet Take 1 tablet by mouth at bedtime as  needed for mild constipation.   sertraline (ZOLOFT) 50 MG tablet Take 1 tablet (50 mg total) by mouth daily. Do not sudden stop Start at half tablet first 2 weeks   [DISCONTINUED] buprenorphine-naloxone (SUBOXONE) 8-2 mg SUBL SL tablet Place 1.5 tablets under the tongue daily for 28 days.   naloxone (NARCAN) nasal spray 4 mg/0.1 mL Place 1 spray into the nose once for 1 dose. Use for over sedation for anyone suspected of overdose.   No facility-administered medications prior to visit.    Chief Complaint  Patient presents with   1 month follow-up   Opioid Problem    Needs buprenorphine refills      See problem overview for details on interim history collected today         Objective:   Clinical Data Analysis:  No vitals available, video visit General Appearance:  Well developed, well nourished, no acute distress.   Pulmonary:  No respiratory distress apparent.    Neurological:  Awake, alert. No obvious focal neurological deficits or cognitive impairments.  Sensorium seems unclouded. Psychiatric:  Appropriate mood, pleasant demeanor  calm, articulate, good mood Problem-specific findings: calm appearing, needed to get to work.    Results Reviewed:           Signed: Lula Olszewski, MD 03/05/2023 2:21 PM

## 2023-03-05 NOTE — Assessment & Plan Note (Addendum)
Controlled, stable Continue with same dose, 28 day supply, return to clinic 28 days Reviewed all elements of compliance, discovered we need a fresh contract at next visit.  PDMP last reviewed (include red flags): PDMP reviewed during this encounter.

## 2023-03-05 NOTE — Assessment & Plan Note (Signed)
Seems to be doing well No signs of mania at appointment by video observation He is getting job now so that's a good sign.

## 2023-03-05 NOTE — Assessment & Plan Note (Signed)
Encouraged cessation Discussed nic pouches

## 2023-03-07 DIAGNOSIS — Z419 Encounter for procedure for purposes other than remedying health state, unspecified: Secondary | ICD-10-CM | POA: Diagnosis not present

## 2023-03-21 ENCOUNTER — Other Ambulatory Visit: Payer: Self-pay | Admitting: Internal Medicine

## 2023-03-21 DIAGNOSIS — F319 Bipolar disorder, unspecified: Secondary | ICD-10-CM

## 2023-04-03 ENCOUNTER — Encounter: Payer: Self-pay | Admitting: Internal Medicine

## 2023-04-03 ENCOUNTER — Telehealth (INDEPENDENT_AMBULATORY_CARE_PROVIDER_SITE_OTHER): Payer: Medicaid Other | Admitting: Internal Medicine

## 2023-04-03 DIAGNOSIS — Z79899 Other long term (current) drug therapy: Secondary | ICD-10-CM

## 2023-04-03 DIAGNOSIS — F17218 Nicotine dependence, cigarettes, with other nicotine-induced disorders: Secondary | ICD-10-CM

## 2023-04-03 DIAGNOSIS — F319 Bipolar disorder, unspecified: Secondary | ICD-10-CM

## 2023-04-03 DIAGNOSIS — K5903 Drug induced constipation: Secondary | ICD-10-CM | POA: Diagnosis not present

## 2023-04-03 DIAGNOSIS — F17200 Nicotine dependence, unspecified, uncomplicated: Secondary | ICD-10-CM | POA: Diagnosis not present

## 2023-04-03 DIAGNOSIS — F1129 Opioid dependence with unspecified opioid-induced disorder: Secondary | ICD-10-CM | POA: Diagnosis not present

## 2023-04-03 DIAGNOSIS — F419 Anxiety disorder, unspecified: Secondary | ICD-10-CM | POA: Diagnosis not present

## 2023-04-03 DIAGNOSIS — B351 Tinea unguium: Secondary | ICD-10-CM | POA: Diagnosis not present

## 2023-04-03 MED ORDER — SERTRALINE HCL 50 MG PO TABS: 50.0000 mg | ORAL_TABLET | Freq: Every day | ORAL | 3 refills | Status: DC

## 2023-04-03 MED ORDER — BUPRENORPHINE HCL-NALOXONE HCL 8-2 MG SL SUBL
1.5000 | SUBLINGUAL_TABLET | Freq: Every day | SUBLINGUAL | 0 refills | Status: DC
Start: 2023-04-03 — End: 2023-04-30

## 2023-04-03 MED ORDER — ARIPIPRAZOLE 5 MG PO TABS
5.0000 mg | ORAL_TABLET | Freq: Every day | ORAL | 3 refills | Status: DC
Start: 2023-04-03 — End: 2024-02-13

## 2023-04-03 MED ORDER — NICOTINE POLACRILEX 4 MG MT LOZG
4.0000 mg | LOZENGE | OROMUCOSAL | 11 refills | Status: DC | PRN
Start: 2023-04-03 — End: 2024-06-23

## 2023-04-03 MED ORDER — NICOTINE POLACRILEX 4 MG MT GUM
4.0000 mg | CHEWING_GUM | OROMUCOSAL | 11 refills | Status: DC | PRN
Start: 2023-04-03 — End: 2024-06-23

## 2023-04-03 MED ORDER — SENNOSIDES-DOCUSATE SODIUM 8.6-50 MG PO TABS
1.0000 | ORAL_TABLET | Freq: Every evening | ORAL | 3 refills | Status: DC | PRN
Start: 2023-04-03 — End: 2023-09-17

## 2023-04-03 MED ORDER — CICLOPIROX OLAMINE 0.77 % EX CREA: TOPICAL_CREAM | Freq: Two times a day (BID) | CUTANEOUS | 0 refills | Status: DC

## 2023-04-03 NOTE — Assessment & Plan Note (Signed)
Refilled - proud of progress here, not easy with suboxone Continue Wellbutrin and nicotine replacement therapy.

## 2023-04-03 NOTE — Assessment & Plan Note (Signed)
Asked him to send MyChart photos of medications for diversion prevention Checked prescription drug monitoring program today. He also has been on probation and monitored there as well. Still need to get suboxone contract but he usually Investment banker, [employer])  because financial constraints and lives far away.

## 2023-04-03 NOTE — Progress Notes (Signed)
[facility]: [phone]   Virtual Medical Office Visit - Video Telemedicine   Patient:  Carl Le (August 12, 1981) located at home standing outside MRN:   [id number]      Date:   04/03/2023  PCP:    Lula Olszewski, MD   Today's Healthcare Provider: Lula Olszewski, MD located at office: [facility] at [facility] [address], [place] [address] Today's Telemedicine visit was conducted via Video for 49m 47s after consent for telemedicine was obtained:  Video connection was never lost All video encounter participant identities and locations confirmed visually and verbally.   Problem Focused Charting:   Medical Decision Making per Assessment/Plan   Carl Le was seen today for medication refill.  Drug-induced constipation -     Sennosides-Docusate Sodium; Take 1 tablet by mouth at bedtime as needed for mild constipation.  Dispense: 90 tablet; Refill: 3  Opioid dependence with opioid-induced disorder (HCC) Overview: Interim history: per patient report, remaining in recovery  Prior history: Remote history  of legal issues associated with opioid use disorder, and remote history iatrogenic dependence devolving to street use. Was hospitalized due to severe pneumonia with chest pain leading to resumption of percocet that made him fear return to opioid misuse, has done well with suboxone.  Has been very forthcoming about this issue - probation officer had concern(s) about suboxone but he felt high risk to return to use without it after hospitalization with severe pneumonia resulted in a lot of chest pain late 2023.  Assessment & Plan: Doing well Continue with same dose suboxone (12 mg), 28 days supply  Orders: -     Buprenorphine HCl-Naloxone HCl; Place 1.5 tablets under the tongue daily for 28 days.  Dispense: 42 tablet; Refill: 0  Bipolar 1 disorder (HCC) Overview: Interim history: taking abilify  Prior history: History substance  use disorder, and also history things like spending at thousand dollars on lottery tickets so this was speculative diagnosis for which we I resumed Abilify which he had self discontinued prior.   Orders: -     ARIPiprazole; Take 1 tablet (5 mg total) by mouth daily.  Dispense: 90 tablet; Refill: 3  Tobacco dependence -     Nicotine Polacrilex; Take 1 each (4 mg total) by mouth as needed for smoking cessation.  Dispense: 220 each; Refill: 11  Cigarette nicotine dependence with other nicotine-induced disorder Overview: Interim history:  called quit line, patient reports 3 weeks off cigarettes = just nicotine replacement therapy , needs refills, has patches   Attempting cessation  since at least 09/2022 with wellbutrin and nic gum but still smoking and history of pneumonia Doesn't like inhalers     Assessment & Plan: Refilled - proud of progress here, not easy with suboxone Continue Wellbutrin and nicotine replacement therapy.  Orders: -     Nicotine Polacrilex; Take 1 lozenge (4 mg total) by mouth as needed for smoking cessation.  Dispense: 108 tablet; Refill: 11  High risk medication use Overview: Interim history:   He reports excellent adherence to his controlled substance medication regimen and compliance with expectations. There has been No evidence of misuse or diversion identified at this visit and no suspicion has been raised for such.  Indication for controlled substance: opioid use disorder  Medication and dose: 12 mg suboxone # pills per month: 42/28 days  Morphine equivalents: N/A for buprenorphine   Controlled substance contract signed (Y/N): Yes, scanned I thought, but I cannot locate in our media  files so needs to be recreated at next in-office visit.  He lives far from office so its a challenge. Urine Drug Screening Data:    Component Value Date/Time   COCAINSCRNUR Negative 10/18/2022 1426     Assessment & Plan: Asked him to send MyChart photos of medications  for diversion prevention Checked prescription drug monitoring program today. He also has been on probation and monitored there as well. Still need to get suboxone contract but he usually [employer], [employer])  because financial constraints and lives far away.  Orders: -     Buprenorphine HCl-Naloxone HCl; Place 1.5 tablets under the tongue daily for 28 days.  Dispense: 42 tablet; Refill: 0  Onychomycosis -     Ciclopirox Olamine; Apply topically 2 (two) times daily.  Dispense: 15 g; Refill: 0  Anxiety disorder, unspecified type Overview: Interim history: off atarax and benzodiazepine- on Zoloft and Abilify but lot of stress related with stopping smoking and job.  Prior history:  History hydroxyzine/valium in hospital... hydroxyzine not helpful for dyspnea... now off hydroxyzine Was closely associated with severe pneumonia and dyspnea  Assessment & Plan: Increase Abilify, don't want to go up on Wellbutrin and Zoloft.  Orders: -     Sertraline HCl; Take 1 tablet (50 mg total) by mouth daily. Do not sudden stop Start at half tablet first 2 weeks  Dispense: 90 tablet; Refill: 3        Treatment plan discussed and reviewed in detail. Explained medication safety and potential side effects.  Answered all patient questions and confirmed understanding and comfort with the plan. Encouraged patient to contact our office if they have any questions or concerns and be sure to have 28 day follow up scheduled to continue their medication(s) suboxone    Subjective - Clinical Presentation:   Carl Le is a 42 y.o. birth-assigned male  Patient Active Problem List   Diagnosis Date Noted   Bipolar 1 disorder (HCC) 11/24/2022   High risk medication use 10/26/2022   History of incarceration 10/26/2022   Opioid dependence (HCC) 10/18/2022   Chronic bronchitis (HCC) 10/05/2022   Anxiety disorder 09/21/2022   Nicotine dependence 09/21/2022   Past Medical History:   Diagnosis Date   Allergy    Anxiety disorder 09/21/2022   Hydroxyzine failing Valium helped in hospital Being sure of breath has been really scary and makes him breathe harder and makes him feel sick with concern for respiratory failure so I agreed to additional Valium but warned him about the risk of addiction   Chest pain 09/21/2022   Severe associated with chest tube pneumonia with parapneumonic effusion.  Hurts to breath and hurts to cough. Pain severe- persistent.  Prolonged hospitalization  Chest pain is worsened by coughing  Percocet was transitioned to suboxone and it has continued to improve.      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  Has short term disability paperwork being done by another doc      Herpes zoster    High risk medication use 10/26/2022   Suboxone for opioid use  disorder   Kidney calculus    Kidney stone 04/15/2014   Mild emphysema (HCC) 08/30/2022   noted on ct scan   Normocytic anemia 09/07/2022   Lab Results  Component  Value  Date/Time     HGB  16.3  09/21/2022 02:45 PM     HGB  12.5 (L)  09/07/2022 01:12 AM     HGB  11.8 (L)  09/06/2022 01:58 AM     HGB  12.1 (L)  09/04/2022 01:17 AM     HGB  12.7 (L)  09/03/2022 01:06 AM     HGB  15.8  05/11/2014 12:05 PM     HGB  16.3  04/11/2014 05:19 PM     HGB  16.9  04/05/2014 10:57 AM     HGB  15.0  02/27/2014 12:10 PM         Lab Results  Compone   Parapneumonic effusion 08/31/2022   Pneumonia 2023   Tobacco dependence 09/21/2022   Tobacco use 09/02/2022    Outpatient Medications Prior to Visit  Medication Sig    acetaminophen (TYLENOL) 325 MG tablet Take 2 tablets (650 mg total) by mouth every 6 (six) hours as needed for mild pain (or Fever >/= 101).   albuterol (VENTOLIN HFA) 108 (90 Base) MCG/ACT inhaler Inhale 1 puff into the lungs every 4 (four) hours as needed for wheezing or shortness of breath.   buPROPion (WELLBUTRIN XL) 150 MG 24 hr tablet Take 150 mg by mouth daily.   buPROPion (WELLBUTRIN XL) 300 MG 24 hr tablet Take 1 tablet (300 mg total) by mouth daily.   ibuprofen (ADVIL) 800 MG tablet Take 1 tablet (800 mg total) by mouth every 8 (eight) hours as needed.   nicotine (NICODERM CQ - DOSED IN MG/24 HOURS) 21 mg/24hr patch PLACE 1 PATCH ONTO THE SKIN DAILY.   [DISCONTINUED] ARIPiprazole (ABILIFY) 2 MG tablet TAKE 1 TABLET BY MOUTH EVERY DAY   [DISCONTINUED] ciclopirox (LOPROX) 0.77 % cream Apply topically 2 (two) times daily.   [DISCONTINUED] hydrOXYzine (ATARAX) 50 MG tablet Take 1 tablet (50 mg total) by mouth in the morning and at bedtime.   [DISCONTINUED] nicotine (NICOTROL) 10 MG inhaler Inhale 1 Cartridge (1 continuous puffing total) into the lungs as needed for smoking cessation.   [DISCONTINUED] nicotine polacrilex (NICORETTE) 4 MG gum Take 1 each (4 mg total) by mouth as needed for smoking cessation.   [DISCONTINUED] nicotine polacrilex (NICOTINE MINI) 4 MG lozenge Take 1 lozenge (4 mg total) by mouth as needed for smoking cessation.   [DISCONTINUED] oxymetazoline (AFRIN) 0.05 % nasal spray Place 1 spray into both nostrils daily as needed for congestion.   [DISCONTINUED] senna-docusate (SENOKOT-S) 8.6-50 MG tablet Take 1 tablet by mouth at bedtime as needed for mild constipation.   [DISCONTINUED] sertraline (ZOLOFT) 50 MG tablet Take 1 tablet (50 mg total) by mouth daily. Do not sudden stop Start at half tablet first 2 weeks   naloxone (NARCAN) nasal spray 4 mg/0.1 mL Place 1 spray into the nose once for 1 dose. Use for over sedation for anyone suspected of overdose.   [DISCONTINUED]  buprenorphine-naloxone (SUBOXONE) 8-2 mg SUBL SL tablet Place 1.5 tablets under the tongue daily for 28 days.   No facility-administered medications prior to visit.    Chief Complaint  Patient presents with   Medication Refill    Suboxone.    HPI done as interim history overview updates.         Objective:   Clinical Data  Analysis:  No vitals available, video visit General Appearance:  Well developed, well nourished, no acute distress.   Pulmonary:  No respiratory distress apparent.    Neurological:  Awake, alert. No obvious focal neurological deficits or cognitive impairments.  Sensorium seems unclouded. Psychiatric:  Appropriate mood, pleasant demeanor  calm, articulate, good mood Problem-specific findings: appeared sober, no drowsiness, quick witted         Signed: Lula Olszewski, MD 04/03/2023 9:29 AM

## 2023-04-03 NOTE — Assessment & Plan Note (Signed)
Doing well Continue with same dose suboxone (12 mg), 28 days supply

## 2023-04-03 NOTE — Assessment & Plan Note (Signed)
Increase Abilify, don't want to go up on Wellbutrin and Zoloft.

## 2023-04-07 DIAGNOSIS — Z419 Encounter for procedure for purposes other than remedying health state, unspecified: Secondary | ICD-10-CM | POA: Diagnosis not present

## 2023-04-11 ENCOUNTER — Telehealth: Payer: Self-pay | Admitting: Internal Medicine

## 2023-04-11 NOTE — Telephone Encounter (Signed)
error 

## 2023-04-30 ENCOUNTER — Telehealth (INDEPENDENT_AMBULATORY_CARE_PROVIDER_SITE_OTHER): Payer: Medicaid Other | Admitting: Internal Medicine

## 2023-04-30 ENCOUNTER — Encounter: Payer: Self-pay | Admitting: Internal Medicine

## 2023-04-30 DIAGNOSIS — Z79899 Other long term (current) drug therapy: Secondary | ICD-10-CM | POA: Diagnosis not present

## 2023-04-30 DIAGNOSIS — F1129 Opioid dependence with unspecified opioid-induced disorder: Secondary | ICD-10-CM | POA: Diagnosis not present

## 2023-04-30 DIAGNOSIS — F319 Bipolar disorder, unspecified: Secondary | ICD-10-CM

## 2023-04-30 DIAGNOSIS — F411 Generalized anxiety disorder: Secondary | ICD-10-CM

## 2023-04-30 MED ORDER — LAMOTRIGINE 25 MG PO TABS
ORAL_TABLET | ORAL | 0 refills | Status: DC
Start: 2023-04-30 — End: 2023-05-22

## 2023-04-30 MED ORDER — BUPRENORPHINE HCL-NALOXONE HCL 8-2 MG SL SUBL
1.5000 | SUBLINGUAL_TABLET | Freq: Every day | SUBLINGUAL | 0 refills | Status: DC
Start: 2023-04-30 — End: 2023-05-24

## 2023-04-30 NOTE — Assessment & Plan Note (Signed)
Bipolar Disorder: Suspected as a potential source of their anxiety, we plan to manage with Lamotrigine. We will initiate Lamotrigine with a ramping dose over the next month, advising them to discontinue if a rash develops. We will consider weaning off Abilify if Lamotrigine is effective.

## 2023-04-30 NOTE — Assessment & Plan Note (Addendum)
Anxiety: Their anxiety remains poorly controlled despite the current regimen of Zoloft, Abilify, and Hydroxyzine, with Hydroxyzine only effective for sleep. We will continue Zoloft and Abilify at current doses and initiate Lamotrigine with a ramping dose over the next month, advising them to discontinue if a rash develops. We will consider weaning off Abilify if Lamotrigine proves effective.Referral: A psychiatry referral is needed for further management of their anxiety and suspected bipolar disorder. We will attempt to secure a psychiatry referral.

## 2023-04-30 NOTE — Progress Notes (Signed)
[facility]: [phone]   Virtual Medical Office Visit - Video Telemedicine   Patient:  Carl Le (April 25, 1981)  MRN:   [id number]      Date:   04/30/2023  Patient Care Team: Lula Olszewski, MD as PCP - General (Internal Medicine) Today's Healthcare Provider: Lula Olszewski, MD   Assessment & Plan     Ronnel was seen today for medication refill.  Bipolar 1 disorder (HCC) Overview: Interim history: taking abilify  Prior history: History substance use disorder, and also history things like spending at thousand dollars on lottery tickets so this was speculative diagnosis for which we I resumed Abilify which he had self discontinued prior.   Assessment & Plan: Bipolar Disorder: Suspected as a potential source of their anxiety, we plan to manage with Lamotrigine. We will initiate Lamotrigine with a ramping dose over the next month, advising them to discontinue if a rash develops. We will consider weaning off Abilify if Lamotrigine is effective.  Orders: -     Ambulatory referral to Psychiatry -     lamoTRIgine; Take 1 tablet (25 mg total) by mouth daily for 14 days, THEN 2 tablets (50 mg total) daily for 14 days. for bipolar I disorder is 25 mg once daily for two weeks, followed by an increase to 50 mg once daily with follow up increase to 100 likely after that Reasons for discontinuation, besides rash, include hypersensitivity reactions, multiorgan hypersensitivity reactions,  increased risk of suicidal thoughts or behavior, and severe central nervous system reactions..  Dispense: 50 tablet; Refill: 0  High risk medication use Overview: Interim history:   He reports excellent adherence to his controlled substance medication regimen and compliance with expectations. There has been No evidence of misuse or diversion identified at this visit and no suspicion has been raised for such.  Indication for controlled substance: opioid use disorder  Medication  and dose: 12 mg suboxone # pills per month: 42/28 days  Morphine equivalents: N/A for buprenorphine   Controlled substance contract signed (Y/N): Yes, scanned I thought, but I cannot locate in our media files so needs to be recreated at next in-office visit.  He lives far from office so its a challenge. Urine Drug Screening Data:    Component Value Date/Time   COCAINSCRNUR Negative 10/18/2022 1426     Orders: -     Buprenorphine HCl-Naloxone HCl; Place 1.5 tablets under the tongue daily for 28 days.  Dispense: 42 tablet; Refill: 0  Opioid dependence with opioid-induced disorder (HCC) Overview: Interim history:   anxiety not managing anxiety, patient expresses that there is no cravings or relapses.    Prior history: Remote history  of legal issues associated with opioid use disorder, and remote history iatrogenic dependence devolving to street use. Was hospitalized due to severe pneumonia with chest pain leading to resumption of percocet that made him fear return to opioid misuse, has done well with suboxone.  Has been very forthcoming about this issue - probation officer had concern(s) about suboxone but he felt high risk to return to use without it after hospitalization with severe pneumonia resulted in a lot of chest pain late 2023.  Assessment & Plan: They are stable on Suboxone with no reported cravings or relapses. We will continue Suboxone 1.5 tablets daily and refill for 28 days (42 tablets) [facility].   Generalized anxiety disorder Overview: Interim history: off atarax and benzodiazepine- on Zoloft and Abilify but lot of stress related with stopping smoking and  job.  Prior history:  History hydroxyzine/valium in hospital... hydroxyzine not helpful for dyspnea... now off hydroxyzine Was closely associated with severe pneumonia and dyspnea  Assessment & Plan: Anxiety: Their anxiety remains poorly controlled despite the current regimen of Zoloft, Abilify, and Hydroxyzine,  with Hydroxyzine only effective for sleep. We will continue Zoloft and Abilify at current doses and initiate Lamotrigine with a ramping dose over the next month, advising them to discontinue if a rash develops. We will consider weaning off Abilify if Lamotrigine proves effective.Referral: A psychiatry referral is needed for further management of their anxiety and suspected bipolar disorder. We will attempt to secure a psychiatry referral.      Treatment plan discussed and reviewed in detail. Explained medication safety and potential side effects.  Answered all patient questions and confirmed understanding and comfort with the plan. Encouraged patient to contact our office if they have any questions or concerns.  Agreed on patient coming for a sooner office visit if symptoms worsen, persist, or new symptoms develop. Discussed precautions in case of needing to visit the Emergency Department.    Subjective:   Chief Complaint  Patient presents with   Medication Refill    Suboxone.    Discussed the use of AI scribe software for clinical note transcription with the patient, who gave verbal consent to proceed.  History of Present Illness   The patient, currently on Suboxone for opioid misuse, reports good control of cravings with no relapses. However, they express concerns about uncontrolled anxiety, which is not adequately managed by hydroxyzine. The patient describes the hydroxyzine as only beneficial for inducing sleep, but not for managing their daily anxiety. They are also on Zoloft and Abilify, but despite a recent increase in the dose of Abilify, they have not noticed significant improvement in their anxiety symptoms.  The patient has a history of bipolar disorder and is interested in exploring non-benzodiazepine options for anxiety management. They express a preference for avoiding benzodiazepines due to past experiences and potential interactions with Suboxone. They are open to trying Lamictal  (lamotrigine), a mood stabilizer, in hopes of better managing their anxiety symptoms.  The patient also mentions a recent change in their employment status, which will likely result in a transition from Medicaid to private insurance coverage. They are aware of the potential changes this may bring to their medication regimen and are open to adjustments as needed.        Reviewed chart data: Patient Active Problem List   Diagnosis Date Noted   Bipolar 1 disorder (HCC) 11/24/2022   High risk medication use 10/26/2022   History of incarceration 10/26/2022   Opioid dependence (HCC) 10/18/2022   Chronic bronchitis (HCC) 10/05/2022   Anxiety disorder 09/21/2022   Nicotine dependence 09/21/2022   Past Medical History:  Diagnosis Date   Allergy    Anxiety disorder 09/21/2022   Hydroxyzine failing Valium helped in hospital Being sure of breath has been really scary and makes him breathe harder and makes him feel sick with concern for respiratory failure so I agreed to additional Valium but warned him about the risk of addiction   Chest pain 09/21/2022   Severe associated with chest tube pneumonia with parapneumonic effusion.  Hurts to breath and hurts to cough. Pain severe- persistent.  Prolonged hospitalization  Chest pain is worsened by coughing  Percocet was transitioned to suboxone and it has continued to improve.      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  Has short term disability paperwork being done by another doc      Herpes zoster    High risk medication use 10/26/2022   Suboxone for  opioid use disorder   Kidney calculus    Kidney stone 04/15/2014   Mild emphysema (HCC) 08/30/2022   noted on ct scan   Normocytic anemia 09/07/2022   Lab Results  Component  Value  Date/Time     HGB  16.3  09/21/2022 02:45 PM     HGB  12.5 (L)  09/07/2022 01:12 AM     HGB  11.8 (L)  09/06/2022 01:58 AM     HGB  12.1 (L)  09/04/2022 01:17 AM     HGB  12.7 (L)  09/03/2022 01:06 AM     HGB  15.8  05/11/2014 12:05 PM     HGB  16.3  04/11/2014 05:19 PM     HGB  16.9  04/05/2014 10:57 AM     HGB  15.0  02/27/2014 12:10 PM         Lab Results  Compone   Parapneumonic effusion 08/31/2022   Pneumonia 2023   Tobacco dependence 09/21/2022   Tobacco use 09/02/2022    Outpatient Medications Prior to Visit  Medication Sig   acetaminophen (TYLENOL) 325 MG tablet Take 2 tablets (650 mg total) by mouth every 6 (six) hours as needed for mild pain (or Fever >/= 101).   albuterol (VENTOLIN HFA) 108 (90 Base) MCG/ACT inhaler Inhale 1 puff into the lungs every 4 (four) hours as needed for wheezing or shortness of breath.   ARIPiprazole (ABILIFY) 5 MG tablet Take 1 tablet (5 mg total) by mouth daily.   buPROPion (WELLBUTRIN XL) 150 MG 24 hr tablet Take 150 mg by mouth daily.   buPROPion (WELLBUTRIN XL) 300 MG 24 hr tablet Take 1 tablet (300 mg total) by mouth daily.   ciclopirox (LOPROX) 0.77 % cream Apply topically 2 (two) times daily.   hydrOXYzine (ATARAX) 50 MG tablet Take 50 mg by mouth 2 (two) times daily.   ibuprofen (ADVIL) 800 MG tablet Take 1 tablet (800 mg total) by mouth every 8 (eight) hours as needed.   nicotine (NICODERM CQ - DOSED IN MG/24 HOURS) 21 mg/24hr patch PLACE 1 PATCH ONTO THE SKIN DAILY.   nicotine polacrilex (NICORETTE) 4 MG gum Take 1 each (4 mg total) by mouth as needed for smoking cessation.   nicotine polacrilex (NICOTINE MINI) 4 MG lozenge Take 1 lozenge (4 mg total) by mouth as needed for smoking cessation.   senna-docusate (SENOKOT-S) 8.6-50 MG tablet Take 1 tablet by mouth at  bedtime as needed for mild constipation.   sertraline (ZOLOFT) 50 MG tablet Take 1 tablet (50 mg total) by mouth daily. Do not sudden stop Start at half tablet first 2 weeks   [DISCONTINUED] buprenorphine-naloxone (SUBOXONE) 8-2 mg SUBL SL tablet Place 1.5 tablets under the tongue daily for 28 days.   naloxone (NARCAN) nasal spray 4 mg/0.1 mL Place 1 spray into the nose once for 1 dose. Use for over sedation for anyone suspected of overdose.   No  facility-administered medications prior to visit.           Objective:  Physical Exam         General Appearance:  Well developed, well nourished, no acute distress, by limited video assessment Pulmonary:  No respiratory distress apparent.    Neurological:  Awake, alert. No obvious focal neurological deficits or cognitive impairments.  Sensorium seems unclouded. Psychiatric:  Appropriate mood, pleasant demeanor  calm, articulate, good mood Problem-specific findings: mild anxious  Results Reviewed:        ------------------------------------------------------ Attestation:  Today's Healthcare Provider Lula Olszewski, MD was located at office at [facility] at [facility] [address] [place] [address]. The patient was located at home. Today's Telemedicine visit was conducted via Video for 38m 32s after consent for telemedicine was obtained:  Video connection was never lost All video encounter participant identities and locations confirmed visually and verbally.  Signed: Lula Olszewski, MD 04/30/2023 7:38 PM

## 2023-04-30 NOTE — Assessment & Plan Note (Signed)
They are stable on Suboxone with no reported cravings or relapses. We will continue Suboxone 1.5 tablets daily and refill for 28 days (42 tablets) [facility].

## 2023-04-30 NOTE — Patient Instructions (Signed)
It was a pleasure seeing you today! Your health and satisfaction are our top priorities.  Glenetta Hew, MD  Your Providers PCP: Lula Olszewski, MD,  [phone]) Referring Provider: Lula Olszewski, MD,  [phone])  VISIT SUMMARY:  During our appointment, we discussed your ongoing treatment for opioid dependence, anxiety, and bipolar disorder. We also talked about your concerns regarding your anxiety management and your interest in exploring non-benzodiazepine options. You expressed a willingness to try Lamotrigine, a mood stabilizer, to better manage your anxiety symptoms. We also discussed your recent change in employment and anticipated change in insurance status.  YOUR PLAN:  -OPIOID DEPENDENCE: You are doing well with your current treatment, Suboxone, which helps reduce cravings and withdrawal symptoms from opioid misuse. We will continue this medication at the same dose.  -ANXIETY: Your current medications, Zoloft, Abilify, and Hydroxyzine, are not adequately managing your anxiety. We will start a new medication, Lamotrigine, which can help stabilize your mood and potentially reduce your anxiety. We will gradually increase the dose over the next few weeks.  -BIPOLAR DISORDER: Your bipolar disorder is currently managed with Abilify. We will continue this medication and also start Lamotrigine, which can help stabilize your mood.  INSTRUCTIONS:  Start taking Lamotrigine as instructed, increasing the dose gradually over the next few weeks. If you develop a rash, stop taking the medication and contact our office immediately. We will also start the process for a referral to a psychiatrist for further management of your anxiety and bipolar disorder. We plan to reassess your condition in approximately 28 days, or sooner if you have any issues with the new medication.   NEXT STEPS: [x]  Early Intervention: Schedule sooner appointment, call our on-call services, or go to emergency room  if there is any significant Increase in pain or discomfort New or worsening symptoms Sudden or severe changes in your health [x]  Flexible Follow-Up: We recommend a No follow-ups on file. for optimal routine care. This allows for progress monitoring and treatment adjustments. [x]  Preventive Care: Schedule your annual preventive care visit! It's typically covered by insurance and helps identify potential health issues early. [x]  Lab & X-ray Appointments: Incomplete tests scheduled today, or call to schedule. X-rays: [facility] (M-F, 8:30am-noon or 1pm-5pm). [x]  Medical Information Release: Sign a release form at front desk to obtain relevant medical information we don't have.  MAKING THE MOST OF OUR FOCUSED 20 MINUTE APPOINTMENTS: [x]   Clearly state your top concerns at the beginning of the visit to focus our discussion [x]   If you anticipate you will need more time, please inform the front desk during scheduling - we can book multiple appointments in the same week. [x]   If you have transportation problems- use our convenient video appointments or ask about transportation support. [x]   We can get down to business faster if you use MyChart to update information before the visit and submit non-urgent questions before your visit. Thank you for taking the time to provide details through MyChart.  Let our nurse know and she can import this information into your encounter documents.  Arrival and Wait Times: [x]   Arriving on time ensures that everyone receives prompt attention. [x]   Early morning (8a) and afternoon (1p) appointments tend to have shortest wait times. [x]   Unfortunately, we cannot delay appointments for late arrivals or hold slots during phone calls.  Getting Answers and Following Up [x]   Simple Questions & Concerns: For quick questions or basic follow-up after your visit, reach [place] at [phone] or MyChart messaging. [x]   Complex Concerns: If your concern is more  complex, scheduling an appointment might be best. Discuss this with the staff to find the most suitable option. [x]   Lab & Imaging Results: We'll contact you directly if results are abnormal or you don't use MyChart. Most normal results will be on MyChart within 2-3 business days, with a review message from Dr. [name]. Haven't heard back in 2 weeks? Need results sooner? Contact us at [phone]. [x]   Referrals: Our referral coordinator will manage specialist referrals. The specialist's office should contact you within 2 weeks to schedule an appointment. Call us if you haven't heard from them after 2 weeks.  Staying Connected [x]   MyChart: Activate your MyChart for the fastest way to access results and message [place]. See the last page of this paperwork for instructions on how to activate.  Bring to Your Next Appointment [x]   Medications: Please bring all your medication bottles to your next appointment to ensure we have an accurate record of your prescriptions. [x]   Health Diaries: If you're monitoring any health conditions at home, keeping a diary of your readings can be very helpful for discussions at your next appointment.  Billing [x]   X-ray & Lab Orders: These are billed by separate companies. Contact the invoicing company directly for questions or concerns. [x]   Visit Charges: Discuss any billing inquiries with our administrative services team.  Your Satisfaction Matters [x]   Share Your Experience: We strive for your satisfaction! If you have any complaints, or preferably compliments, please let Dr. [name] know directly or contact our Practice Administrators, Edwena Felty or [employer], by asking at the front desk.   Reviewing Your Records [x]   Review this early draft of your clinical encounter notes below and the final encounter summary tomorrow on MyChart after its been completed.  All orders placed so far are visible here: Bipolar 1 disorder [facility]) Assessment & Plan: Bipolar  Disorder: Suspected as a potential source of their anxiety, we plan to manage with Lamotrigine. We will initiate Lamotrigine with a ramping dose over the next month, advising them to discontinue if a rash develops. We will consider weaning off Abilify if Lamotrigine is effective.  Orders: -     Ambulatory referral to Psychiatry -     lamoTRIgine; Take 1 tablet (25 mg total) by mouth daily for 14 days, THEN 2 tablets (50 mg total) daily for 14 days. for bipolar I disorder is 25 mg once daily for two weeks, followed by an increase to 50 mg once daily with follow up increase to 100 likely after that Reasons for discontinuation, besides rash, include hypersensitivity reactions, multiorgan hypersensitivity reactions,  increased risk of suicidal thoughts or behavior, and severe central nervous system reactions..  Dispense: 50 tablet; Refill: 0  High risk medication use -     Buprenorphine HCl-Naloxone HCl; Place 1.5 tablets under the tongue daily for 28 days.  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 We will continue Suboxone 1.5 tablets daily and refill for 28 days (42 tablets) to [facility].   Generalized anxiety disorder Assessment & Plan: Anxiety: Their anxiety remains poorly controlled despite the current regimen of Zoloft, Abilify, and Hydroxyzine, with Hydroxyzine only effective for sleep. We will continue Zoloft and Abilify at current doses and initiate Lamotrigine with a ramping dose over the next month, advising them to discontinue if  a rash develops. We will consider weaning off Abilify if Lamotrigine proves effective.Referral: A psychiatry referral is needed for further management of their anxiety and suspected bipolar disorder. We will attempt to secure a psychiatry referral.

## 2023-05-07 DIAGNOSIS — Z419 Encounter for procedure for purposes other than remedying health state, unspecified: Secondary | ICD-10-CM | POA: Diagnosis not present

## 2023-05-22 ENCOUNTER — Other Ambulatory Visit: Payer: Self-pay | Admitting: Internal Medicine

## 2023-05-22 DIAGNOSIS — F319 Bipolar disorder, unspecified: Secondary | ICD-10-CM

## 2023-05-24 ENCOUNTER — Telehealth: Payer: Medicaid Other | Admitting: Internal Medicine

## 2023-05-24 ENCOUNTER — Encounter: Payer: Self-pay | Admitting: Internal Medicine

## 2023-05-24 DIAGNOSIS — F1129 Opioid dependence with unspecified opioid-induced disorder: Secondary | ICD-10-CM | POA: Diagnosis not present

## 2023-05-24 DIAGNOSIS — B351 Tinea unguium: Secondary | ICD-10-CM

## 2023-05-24 DIAGNOSIS — Z79899 Other long term (current) drug therapy: Secondary | ICD-10-CM

## 2023-05-24 DIAGNOSIS — F411 Generalized anxiety disorder: Secondary | ICD-10-CM

## 2023-05-24 DIAGNOSIS — F17299 Nicotine dependence, other tobacco product, with unspecified nicotine-induced disorders: Secondary | ICD-10-CM

## 2023-05-24 MED ORDER — CICLOPIROX OLAMINE 0.77 % EX CREA
TOPICAL_CREAM | Freq: Two times a day (BID) | CUTANEOUS | 5 refills | Status: DC
Start: 2023-05-24 — End: 2024-06-23

## 2023-05-24 MED ORDER — BUPRENORPHINE HCL-NALOXONE HCL 8-2 MG SL SUBL
1.5000 | SUBLINGUAL_TABLET | Freq: Every day | SUBLINGUAL | 0 refills | Status: DC
Start: 2023-05-27 — End: 2023-06-26

## 2023-05-24 NOTE — Assessment & Plan Note (Signed)
Visit today in large part due to suboxone maintenance therapy monitoring, seems to be going well. PDMP reviewed during this encounter. Updated problem overview for this problem to improve longitudinal management A He also has been on probation and monitored there as well. Still need to get suboxone contract but he usually Education administrator Visit (Marketing executive)  because financial constraints and lives far away we also need labwork monitoring for lamotrigine, urine drug screen repeat by December.

## 2023-05-24 NOTE — Patient Instructions (Addendum)
It was a pleasure seeing you today! Your health and satisfaction are our top priorities.  Glenetta Hew, MD  Your Providers PCP: Lula Olszewski, MD,  [phone]) Referring Provider: Lula Olszewski, MD,  [phone])  NEXT STEPS: [x]  Early Intervention: Schedule sooner appointment, call our on-call services, or go to emergency room if there is any significant Increase in pain or discomfort New or worsening symptoms Sudden or severe changes in your health [x]  Flexible Follow-Up: We recommend a Return in about 1 month (around 06/24/2023) for chronic disease monitoring and management. for optimal routine care. This allows for progress monitoring and treatment adjustments. [x]  Preventive Care: Schedule your annual preventive care visit! It's typically covered by insurance and helps identify potential health issues early. [x]  Lab & X-ray Appointments: Incomplete tests scheduled today, or call to schedule. X-rays: [facility] (M-F, 8:30am-noon or 1pm-5pm). [x]  Medical Information Release: Sign a release form at front desk to obtain relevant medical information we don't have.  MAKING THE MOST OF OUR FOCUSED 20 MINUTE APPOINTMENTS: [x]   Clearly state your top concerns at the beginning of the visit to focus our discussion [x]   If you anticipate you will need more time, please inform the front desk during scheduling - we can book multiple appointments in the same week. [x]   If you have transportation problems- use our convenient video appointments or ask about transportation support. [x]   We can get down to business faster if you use MyChart to update information before the visit and submit non-urgent questions before your visit. Thank you for taking the time to provide details through MyChart.  Let our nurse know and she can import this information into your encounter documents.  Arrival and Wait Times: [x]   Arriving on time ensures that everyone receives prompt attention. [x]    Early morning (8a) and afternoon (1p) appointments tend to have shortest wait times. [x]   Unfortunately, we cannot delay appointments for late arrivals or hold slots during phone calls.  Getting Answers and Following Up [x]   Simple Questions & Concerns: For quick questions or basic follow-up after your visit, reach [place] at [phone] or MyChart messaging. [x]   Complex Concerns: If your concern is more complex, scheduling an appointment might be best. Discuss this with the staff to find the most suitable option. [x]   Lab & Imaging Results: We'll contact you directly if results are abnormal or you don't use MyChart. Most normal results will be on MyChart within 2-3 business days, with a review message from Dr. [name]. Haven't heard back in 2 weeks? Need results sooner? Contact us at [phone]. [x]   Referrals: Our referral coordinator will manage specialist referrals. The specialist's office should contact you within 2 weeks to schedule an appointment. Call us if you haven't heard from them after 2 weeks.  Staying Connected [x]   MyChart: Activate your MyChart for the fastest way to access results and message [place]. See the last page of this paperwork for instructions on how to activate.  Bring to Your Next Appointment [x]   Medications: Please bring all your medication bottles to your next appointment to ensure we have an accurate record of your prescriptions. [x]   Health Diaries: If you're monitoring any health conditions at home, keeping a diary of your readings can be very helpful for discussions at your next appointment.  Billing [x]   X-ray & Lab Orders: These are billed by separate companies. Contact the invoicing company directly for questions or concerns. [x]   Visit Charges: Discuss any  billing inquiries with our administrative services team.  Your Satisfaction Matters [x]   Share Your Experience: We strive for your satisfaction! If you have any complaints, or preferably compliments,  please let Dr. [name] know directly or contact our Practice Administrators, Edwena Felty or [employer], by asking at the front desk.   Reviewing Your Records [x]   Review this early draft of your clinical encounter notes below and the final encounter summary tomorrow on MyChart after its been completed.  All orders placed so far are visible here: Opioid dependence with opioid-induced disorder [facility]) Assessment & Plan: Encouraged patient to continue suboxone monthly We have a few issues to address with an in-person appointment as soon as feasible, but from [employer] Visit (Video Appointment) he seems to be doing outstandingly.   High risk medication use Assessment & Plan: Visit today in large part due to suboxone maintenance therapy monitoring, seems to be going well. PDMP reviewed during this encounter. Updated problem overview for this problem to improve longitudinal management A He also has been on probation and monitored there as well. Still need to get suboxone contract but he usually Education administrator Visit (Marketing executive)  because financial constraints and lives far away we also need labwork monitoring for lamotrigine, urine drug screen repeat by December.  Orders: -     Buprenorphine HCl-Naloxone HCl; Place 1.5 tablets under the tongue daily for 28 days.  Dispense: 42 tablet; Refill: 0  Onychomycosis -     Ciclopirox Olamine; Apply topically 2 (two) times daily.  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

## 2023-05-24 NOTE — Progress Notes (Signed)
[facility]: [phone]   Virtual Medical Office Visit - Video Telemedicine   Patient:  Carl Le ([birth year])  MRN:   [id number]      Date:   05/24/2023  Patient Care Team: Carl Olszewski, MD as PCP - General (Internal Medicine) Today's Healthcare Provider: Lula Olszewski, MD   Assessment & Plan    Carl Le was seen today for medication refill.  Opioid dependence with opioid-induced disorder (HCC) Overview:  no cravings or relapses, suboxone medication(s) working well 12 mg daily since starting 02/2023. Prior history: Remote history  of legal issues associated with opioid use disorder, and remote history iatrogenic dependence devolving to street use. Was hospitalized due to severe pneumonia with chest pain leading to resumption of percocet that made him fear return to opioid misuse, has done well with suboxone.  Has been very forthcoming about this issue - probation officer had concern(s) about suboxone but he felt high risk to return to use without it after hospitalization with severe pneumonia resulted in a lot of chest pain late 2023.  Assessment & Plan: Encouraged patient to continue suboxone monthly We have a few issues to address with an in-person appointment as soon as feasible, but from [employer] Visit (Video Appointment) he seems to be doing outstandingly.   High risk medication use Overview: Consistently reports excellent adherence to his controlled substance medication regimen and compliance with expectations. There has never been evidence of misuse or diversion identified and no suspicion has been raised for such.  Indication for controlled substance: opioid use disorder  Medication and dose: 12 mg suboxone # pills per month: 42/28 days  Morphine equivalents: N/A for buprenorphine   Controlled substance contract signed (Y/N): Yes, scanned I thought, but I cannot locate in our media files so needs to be recreated at next in-office  visit.  He lives far from office so its a challenge. Urine Drug Screening Data:    Component Value Date/Time   COCAINSCRNUR Negative 10/18/2022 1426     Assessment & Plan: Visit today in large part due to suboxone maintenance therapy monitoring, seems to be going well. PDMP reviewed during this encounter. Updated problem overview for this problem to improve longitudinal management A He also has been on probation and monitored there as well. Still need to get suboxone contract but he usually Education administrator Visit (Marketing executive)  because financial constraints and lives far away we also need labwork monitoring for lamotrigine, urine drug screen repeat by December.  Orders: -     Buprenorphine HCl-Naloxone HCl; Place 1.5 tablets under the tongue daily for 28 days.  Dispense: 42 tablet; Refill: 0  Onychomycosis -     Ciclopirox Olamine; Apply topically 2 (two) times daily.  Dispense: 15 g; Refill: 5  Generalized anxiety disorder Overview: Rarely needing atarax Zoloft and Abilify lamotrigine, and has stopped smoking and maintaining work  Prior history:  History hydroxyzine/valium in hospital... hydroxyzine not helpful for dyspnea... 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    Assessment & Plan: Encouraged patient to continue with abstaining from tobacco        Subjective:   Chief Complaint / Reason for Visit:  Medication Refill (Suboxone.)  Patient reports current dose working well. Due to continue refills suboxone in 4 days. Still not smoking, not using. Still  working. Taking all medications as per medication reconciliation, and anxiety well-controlled with this.  42 y.o. male  has a past medical history of Allergy, Anxiety  disorder (09/21/2022), Chest pain (09/21/2022), Community acquired pneumonia of right lower lobe of lung (08/30/2022), Current smoker, Depression, Disability examination (09/21/2022), Herpes zoster, High risk medication use (10/26/2022), Kidney calculus, Kidney stone (04/15/2014), Mild emphysema (HCC) (08/30/2022), Normocytic anemia (09/07/2022), Parapneumonic effusion (08/31/2022), Pneumonia (2023), Tobacco dependence (09/21/2022), and Tobacco use (09/02/2022).   Reviewed chart data: has Anxiety disorder; Nicotine dependence; Chronic bronchitis (HCC); Opioid dependence (HCC); High risk medication use; History of incarceration; and Bipolar 1 disorder (HCC) on their problem list. ARIPiprazole, acetaminophen, albuterol, buPROPion, buprenorphine-naloxone, ciclopirox, hydrOXYzine, ibuprofen, lamoTRIgine, naloxone, nicotine, nicotine polacrilex, senna-docusate, and sertraline  I attest that I have reviewed and confirmed the patients current medications to meet the medication reconciliation requirement Outpatient Medications Prior to Visit  Medication Sig Dispense Refill   acetaminophen (TYLENOL) 325 MG tablet Take 2 tablets (650 mg total) by mouth every 6 (six) hours as needed for mild pain (or Fever >/= 101). 20 tablet 0   albuterol (VENTOLIN HFA) 108 (90 Base) MCG/ACT inhaler Inhale 1 puff into the lungs every 4 (four) hours as needed for wheezing or shortness of breath. 2 each 11   ARIPiprazole (ABILIFY) 5 MG tablet Take 1 tablet (5 mg total) by mouth daily. 90 tablet 3   buPROPion (WELLBUTRIN XL) 300 MG 24 hr tablet Take 1 tablet (300 mg total) by mouth daily. 90 tablet 3   hydrOXYzine (ATARAX) 50 MG tablet Take 50 mg by mouth 2 (two) times daily.     ibuprofen (ADVIL) 800 MG tablet Take 1 tablet (800 mg total) by mouth every 8 (eight) hours as needed. 90 tablet 3   lamoTRIgine (LAMICTAL) 25 MG tablet TAKE 1 TABLET (25 MG TOTAL) BY MOUTH DAILY FOR 14 DAYS, THEN 2 TABLETS (50 MG TOTAL) DAILY FOR 14  DAYS. FOR BIPOLAR I DISORDER IS 25 MG ONCE DAILY FOR TWO WEEKS, FOLLOWED BY AN INCREASE TO 50 MG ONCE DAILY WITH FOLLOW UP INCREASE TO 100 LIKELY AFTER THAT REASONS FOR DISCONTINUATION, BESIDES RASH, INCLUDE HYPERSENSITIVITY REACTIONS, MULTIORGAN HYPERSENSITIVITY REACTIONS, INCREASED RISK OF SUICIDAL THOUGHTS OR BEHAVIOR, AND SEVERE CENTRAL NERVOUS SYSTEM REACTIONS.. 150 tablet 1   nicotine (NICODERM CQ - DOSED IN MG/24 HOURS) 21 mg/24hr patch PLACE 1 PATCH ONTO THE SKIN DAILY. 28 patch 0   nicotine polacrilex (NICORETTE) 4 MG gum Take 1 each (4 mg total) by mouth as needed for smoking cessation. 220 each 11   nicotine polacrilex (NICOTINE MINI) 4 MG lozenge Take 1 lozenge (4 mg total) by mouth as needed for smoking cessation. 108 tablet 11   senna-docusate (SENOKOT-S) 8.6-50 MG tablet Take 1 tablet by mouth at bedtime as needed for mild constipation. 90 tablet 3   sertraline (ZOLOFT) 50 MG tablet Take 1 tablet (50 mg total) by mouth daily. Do not sudden stop Start at half tablet first 2 weeks 90 tablet 3   buprenorphine-naloxone (SUBOXONE) 8-2 mg SUBL SL tablet Place 1.5 tablets under the tongue daily for 28 days. 42 tablet 0   buPROPion (WELLBUTRIN XL) 150 MG 24 hr tablet Take 150 mg by mouth daily.     ciclopirox (LOPROX) 0.77 % cream Apply topically 2 (two) times daily. 15 g 0   naloxone (NARCAN) nasal spray 4 mg/0.1 mL Place 1 spray into the nose once for 1 dose. Use for over sedation for anyone suspected of overdose. 1 g 0  No facility-administered medications prior to visit.           Objective:  Physical Exam          General Appearance:  Well Developed, Well Nourished, No Acute Distress by Limited Video Assessment Pulmonary:  No Respiratory Distress Apparent. Normal Work of Breathing.   Neurological:  Awake, Alert. No Obvious Focal Neurological Deficits or Cognitive Impairments.  Sensorium Seems Unclouded. Psychiatric:  Appropriate Mood, Pleasant Demeanor, Calm, Articulate, Good  Mood     ------------------------------------------------------ Attestation:  Today's Healthcare Provider Carl Olszewski, MD was located at office at [facility] [address] [place] [address]. The patient was located at work, in his car on break. Today's Telemedicine visit was conducted via Video for 16m 26s after consent for telemedicine was obtained:  Video connection was never lost All video encounter participant identities and locations confirmed visually and verbally.  Signed: Lula Olszewski, MD 05/24/2023 6:58 PM

## 2023-05-24 NOTE — Assessment & Plan Note (Signed)
Encouraged patient to continue suboxone monthly We have a few issues to address with an in-person appointment as soon as feasible, but from [employer] Visit (Video Appointment) he seems to be doing outstandingly.

## 2023-05-24 NOTE — Assessment & Plan Note (Signed)
Encouraged patient to continue with abstaining from tobacco

## 2023-06-04 ENCOUNTER — Telehealth: Payer: Medicaid Other | Admitting: Internal Medicine

## 2023-06-07 DIAGNOSIS — Z419 Encounter for procedure for purposes other than remedying health state, unspecified: Secondary | ICD-10-CM | POA: Diagnosis not present

## 2023-06-26 ENCOUNTER — Other Ambulatory Visit: Payer: Self-pay | Admitting: Internal Medicine

## 2023-06-26 ENCOUNTER — Telehealth (INDEPENDENT_AMBULATORY_CARE_PROVIDER_SITE_OTHER): Payer: Medicaid Other | Admitting: Internal Medicine

## 2023-06-26 ENCOUNTER — Encounter: Payer: Self-pay | Admitting: Internal Medicine

## 2023-06-26 DIAGNOSIS — F1129 Opioid dependence with unspecified opioid-induced disorder: Secondary | ICD-10-CM

## 2023-06-26 DIAGNOSIS — Z79899 Other long term (current) drug therapy: Secondary | ICD-10-CM

## 2023-06-26 MED ORDER — BUPRENORPHINE HCL-NALOXONE HCL 8-2 MG SL SUBL
1.5000 | SUBLINGUAL_TABLET | Freq: Every day | SUBLINGUAL | 0 refills | Status: DC
Start: 2023-06-26 — End: 2023-07-25

## 2023-06-26 NOTE — Assessment & Plan Note (Signed)
Visit today only for suboxone maintenance therapy monitoring, still seems to be going well.

## 2023-06-26 NOTE — Assessment & Plan Note (Addendum)
  PDMP reviewed during this encounter. Updated problem overview for this problem to improve longitudinal management  He also has been on probation and monitored there as well. Explained need for fresh contract/ repeat urine drug screen by December.  Appearance is excellent, supporting that he is doing well in recovery.

## 2023-06-26 NOTE — Progress Notes (Signed)
[facility]: [phone]   Routine Virtual Medical Office Visit  Patient:  Carl Le      Age: 42 y.o.       Sex:  male  Date:   06/26/2023 Patient Care Team: Lula Olszewski, MD as PCP - General (Internal Medicine) Today's Healthcare Provider: Lula Olszewski, MD   Assessment and Plan:   Assessment & Plan Opioid dependence with opioid-induced disorder [facility]) Visit today only for suboxone maintenance therapy monitoring, still seems to be going well. High risk medication use  PDMP reviewed during this encounter. Updated problem overview for this problem to improve longitudinal management  He also has been on probation and monitored there as well. Explained need for fresh contract/ repeat urine drug screen by December.  Appearance is excellent, supporting that he is doing well in recovery.   Meds ordered this encounter  Medications   buprenorphine-naloxone (SUBOXONE) 8-2 mg SUBL SL tablet    Sig: Place 1.5 tablets under the tongue daily for 28 days.    Dispense:  42 tablet    Refill:  0   Recommended follow up: 28 days        Clinical Presentation:    42 y.o. male who has Anxiety disorder; Nicotine dependence; Chronic bronchitis (HCC); Opioid dependence (HCC); High risk medication use; History of incarceration; and Bipolar 1 disorder (HCC) on their problem list. His reasons/main concerns/chief complaints for today's office visit are Medication Refill (Suboxone.)  He  has a past medical history of Allergy, Anxiety disorder (09/21/2022), Chest pain (09/21/2022), Community acquired pneumonia of right lower lobe of lung (08/30/2022), Current smoker, Depression, Disability examination (09/21/2022), Herpes zoster, High risk medication use (10/26/2022), Kidney calculus, Kidney stone (04/15/2014), Mild emphysema (HCC) (08/30/2022), Normocytic anemia (09/07/2022), Parapneumonic effusion (08/31/2022), Pneumonia (2023), Tobacco dependence (09/21/2022), and  Tobacco use (09/02/2022).   Today he reports that all he needs is refill on Suboxone that is going really well that he has not had any cravings or relapses and he is even not smoking cigarettes just using nicotine replacement therapy.  He has bipolar history and reports it is stable on current meds he still living far away difficult to come for in person visits has not had any changes in his health history since last visit.  He does endorse chronic constipation associated with the Suboxone but otherwise feels that the current dose of 12 mg daily is working really well and he has no inconsistency with dosing   Problem overviews that were updated today: Problem  High Risk Medication Use   Consistently reports excellent adherence to his controlled substance medication regimen and compliance with expectations. There has never been evidence of misuse or diversion identified and no suspicion has been raised for such.  Indication for controlled substance: opioid use disorder  Medication and dose: 12 mg suboxone # pills per month: 42/28 days  Morphine equivalents: N/A for buprenorphine   Controlled substance contract signed (Y/N): Yes, scanned I thought, but I cannot locate in our media files so needs to be recreated at next in-office visit.  He lives far from office so its a challenge. Urine Drug Screening Data:    Component Value Date/Time   COCAINSCRNUR Negative 10/18/2022 1426        Current Outpatient Medications on File Prior to Visit  Medication Sig   ARIPiprazole (ABILIFY) 5 MG tablet Take 1 tablet (5 mg total) by mouth daily.   buPROPion (WELLBUTRIN XL) 300 MG 24 hr tablet Take 1 tablet (300  mg total) by mouth daily.   ciclopirox (LOPROX) 0.77 % cream Apply topically 2 (two) times daily.   hydrOXYzine (ATARAX) 50 MG tablet Take 50 mg by mouth 2 (two) times daily.   nicotine (NICODERM CQ - DOSED IN MG/24 HOURS) 21 mg/24hr patch PLACE 1 PATCH ONTO THE SKIN DAILY.   nicotine polacrilex  (NICORETTE) 4 MG gum Take 1 each (4 mg total) by mouth as needed for smoking cessation.   nicotine polacrilex (NICOTINE MINI) 4 MG lozenge Take 1 lozenge (4 mg total) by mouth as needed for smoking cessation.   senna-docusate (SENOKOT-S) 8.6-50 MG tablet Take 1 tablet by mouth at bedtime as needed for mild constipation.   sertraline (ZOLOFT) 50 MG tablet Take 1 tablet (50 mg total) by mouth daily. Do not sudden stop Start at half tablet first 2 weeks   lamoTRIgine (LAMICTAL) 25 MG tablet TAKE 1 TABLET (25 MG TOTAL) BY MOUTH DAILY FOR 14 DAYS, THEN 2 TABLETS (50 MG TOTAL) DAILY FOR 14 DAYS. FOR BIPOLAR I DISORDER IS 25 MG ONCE DAILY FOR TWO WEEKS, FOLLOWED BY AN INCREASE TO 50 MG ONCE DAILY WITH FOLLOW UP INCREASE TO 100 LIKELY AFTER THAT REASONS FOR DISCONTINUATION, BESIDES RASH, INCLUDE HYPERSENSITIVITY REACTIONS, MULTIORGAN HYPERSENSITIVITY REACTIONS, INCREASED RISK OF SUICIDAL THOUGHTS OR BEHAVIOR, AND SEVERE CENTRAL NERVOUS SYSTEM REACTIONS.Marland Kitchen   naloxone (NARCAN) nasal spray 4 mg/0.1 mL Place 1 spray into the nose once for 1 dose. Use for over sedation for anyone suspected of overdose.   No current facility-administered medications on file prior to visit.   Medications Discontinued During This Encounter  Medication Reason   acetaminophen (TYLENOL) 325 MG tablet Patient Preference   albuterol (VENTOLIN HFA) 108 (90 Base) MCG/ACT inhaler Patient Preference   ibuprofen (ADVIL) 800 MG tablet Patient Preference   buprenorphine-naloxone (SUBOXONE) 8-2 mg SUBL SL tablet Reorder         Clinical Data Analysis:   Good hygiene and self care noted, thick healthy appearing trimmed beard. General Appearance:  Well Developed, Well Nourished, No Acute Distress by Limited Video Assessment Pulmonary:  No Respiratory Distress Apparent. Normal Work of Breathing.   Neurological:  Awake, Alert. No Obvious Focal Neurological Deficits or Cognitive Impairments.  Sensorium Seems Unclouded. Psychiatric:   Appropriate Mood, Pleasant Demeanor, Calm, Articulate, Good Mood  Results Reviewed: @VANISHINGLABLINKS @   @OBJECTIVEEND @  ------------------------------------------------------ Attestation:  Today's Healthcare Provider @[employer]S @ was located at office at [employer] at [facility] [address] [place] [address]. The patient was located at home. Today's Telemedicine visit was conducted via Video after consent for telemedicine was obtained:  All video encounter participant identities and locations confirmed visually and verbally.   Marland Kitchen ----------------------------------------------------- Lula Olszewski, MD  06/26/2023 2:43 PM   [facility] at [facility]:  [phone]

## 2023-07-08 DIAGNOSIS — Z419 Encounter for procedure for purposes other than remedying health state, unspecified: Secondary | ICD-10-CM | POA: Diagnosis not present

## 2023-07-25 ENCOUNTER — Telehealth (INDEPENDENT_AMBULATORY_CARE_PROVIDER_SITE_OTHER): Payer: Medicaid Other | Admitting: Internal Medicine

## 2023-07-25 ENCOUNTER — Encounter: Payer: Self-pay | Admitting: Internal Medicine

## 2023-07-25 DIAGNOSIS — F1120 Opioid dependence, uncomplicated: Secondary | ICD-10-CM

## 2023-07-25 DIAGNOSIS — Z79899 Other long term (current) drug therapy: Secondary | ICD-10-CM | POA: Diagnosis not present

## 2023-07-25 DIAGNOSIS — F1129 Opioid dependence with unspecified opioid-induced disorder: Secondary | ICD-10-CM | POA: Diagnosis not present

## 2023-07-25 DIAGNOSIS — F319 Bipolar disorder, unspecified: Secondary | ICD-10-CM

## 2023-07-25 DIAGNOSIS — Z720 Tobacco use: Secondary | ICD-10-CM

## 2023-07-25 MED ORDER — BUPRENORPHINE HCL-NALOXONE HCL 8-2 MG SL SUBL
1.5000 | SUBLINGUAL_TABLET | Freq: Every day | SUBLINGUAL | 0 refills | Status: DC
Start: 2023-07-25 — End: 2023-08-21

## 2023-07-25 NOTE — Assessment & Plan Note (Signed)
He remains stable on Suboxone without returning to use but report constipation as a side effect. We will continue Suboxone at the current dose and consider management strategies for constipation.

## 2023-07-25 NOTE — Patient Instructions (Signed)
VISIT SUMMARY:  Dear Mr. Carl Le, during our recent visit, we discussed your ongoing treatment for substance use disorder and mood disorder, your successful cessation of tobacco use, and the healing of your hand injury. We also discussed your current medications and their effects.  YOUR PLAN:  -OPIOID USE DISORDER: You are doing well on your current dose of Suboxone, a medication used to treat opioid addiction. However, you mentioned experiencing constipation, a common side effect. We will continue with the same dose of Suboxone and consider ways to manage the constipation.  -MOOD DISORDER: Your mood has been stable with the help of your current medications (Wellbutrin, Abilify, Lamictal, and Zoloft). We will continue with these medications and may consider reducing them gradually when you are in a stable place.  -TOBACCO USE: Congratulations on successfully quitting smoking without the use of nicotine replacement products. Please continue to avoid tobacco. If you were using nicotine replacement products, consider gradually reducing their use.  -HAND INJURY: Your hand injury has healed well after treatment with cefadroxil and meloxicam. No further action is needed for this.  INSTRUCTIONS:  We will schedule a follow-up appointment in one month, preferably in-person. Please continue taking your medications as prescribed and reach out if you have any concerns or questions in the meantime.

## 2023-07-25 NOTE — Assessment & Plan Note (Signed)
He has good mood control on multiple medications, including Wellbutrin, Abilify, Lamictal, and Zoloft. We will continue the current regimen and consider a gradual reduction of medications when he is in a stable place.

## 2023-07-25 NOTE — Assessment & Plan Note (Signed)
Stable on suboxone long term now. Refills 1 month encouraged patient to follow up in person to take care of regulatory.

## 2023-07-25 NOTE — Progress Notes (Signed)
[facility]: [phone]   Virtual Medical Office Visit - Video Telemedicine   Patient:  Carl Le (Oct 17, 1981) located at home MRN:   [id number]      Date:   07/25/2023  PCP:    Lula Olszewski, MD   Today's Healthcare Provider: Lula Olszewski, MD located at office: [facility] at [facility] [address] [place] [address] Today's Video Telemedicine visit was conducted via Video for after consent for telemedicine was obtained:  Video connection was never lost All video encounter participant identities and locations confirmed visually and verbally.    Assessment & Plan Uncomplicated opioid dependence (HCC) Stable on suboxone long term now. Refills 1 month encouraged patient to follow up in person to take care of regulatory. High risk medication use Stable on suboxone long term now. PDMP reviewed during this encounter. We have given extensive sedative warnings- he denies return to substance use. Opioid dependence with opioid-induced disorder (HCC) He remains stable on Suboxone without returning to use but report constipation as a side effect. We will continue Suboxone at the current dose and consider management strategies for constipation. Bipolar 1 disorder (HCC) He has good mood control on multiple medications, including Wellbutrin, Abilify, Lamictal, and Zoloft. We will continue the current regimen and consider a gradual reduction of medications when he is in a stable place. Tobacco use He report complete cessation of tobacco use without the use of nicotine replacement products. We will congratulate him on his cessation and advise him to stay stocked up on nicotine replacement products if still using, or consider tapering off.    Diagnoses and all orders for this visit: Uncomplicated opioid dependence (HCC) High risk medication use -     buprenorphine-naloxone (SUBOXONE) 8-2 mg SUBL SL tablet; Place 1.5 tablets under the tongue  daily for 28 days.  Recommended follow-up: 28 days        Subjective   42 y.o. male who has Anxiety disorder; Nicotine dependence; Chronic bronchitis (HCC); Opioid dependence (HCC); High risk medication use; History of incarceration; and Bipolar 1 disorder (HCC) on their problem list. His reasons/main concerns/chief complaints for today's office visit are No chief complaint on file.   ------------------------------------------------------------------------------------------------------------------------ AI-Extracted: Discussed the use of AI scribe software for clinical note transcription with the patient, who gave verbal consent to proceed.  History of Present Illness   Mr. Carl Le, a patient with a history of substance use disorder, mood disorder, and tobacco use, presents for a routine follow-up with the primary goal of refilling his Suboxone prescription. He reports satisfaction with the current dose and denies any side effects or return to substance use, except for manageable constipation.  He is currently employed and has been compliant with his medication regimen, which includes Zoloft, Abilify, Lamictal, and Wellbutrin for mood stabilization. Despite the extensive list of medications, he reports a stable mood.  In addition to his psychiatric medications, he had been prescribed cefadroxil for a hand injury sustained at work, which has since healed. He also took meloxicam for the associated hand pain.  Significantly, he reports complete cessation of smoking without the use of nicotine replacement products. He has not experienced any issues with his breathing.  His medications are covered by Medicaid, and he has not reported any financial barriers to obtaining his prescriptions. He has been using the same pharmacy for his prescriptions.      He has a past medical history of Allergy, Anxiety disorder (09/21/2022), Chest pain (09/21/2022), Community acquired  pneumonia of right lower lobe of  lung (08/30/2022), Current smoker, Depression, Disability examination (09/21/2022), Herpes zoster, High risk medication use (10/26/2022), Kidney calculus, Kidney stone (04/15/2014), Mild emphysema (HCC) (08/30/2022), Normocytic anemia (09/07/2022), Parapneumonic effusion (08/31/2022), Pneumonia (2023), Tobacco dependence (09/21/2022), and Tobacco use (09/02/2022).  Problem list overviews that were updated at today's visit: No problems updated. Current Outpatient Medications on File Prior to Visit  Medication Sig   ARIPiprazole (ABILIFY) 5 MG tablet Take 1 tablet (5 mg total) by mouth daily.   buPROPion (WELLBUTRIN XL) 300 MG 24 hr tablet Take 1 tablet (300 mg total) by mouth daily.   ciclopirox (LOPROX) 0.77 % cream Apply topically 2 (two) times daily.   hydrOXYzine (ATARAX) 50 MG tablet Take 50 mg by mouth 2 (two) times daily.   nicotine (NICODERM CQ - DOSED IN MG/24 HOURS) 21 mg/24hr patch PLACE 1 PATCH ONTO THE SKIN DAILY.   nicotine polacrilex (NICORETTE) 4 MG gum Take 1 each (4 mg total) by mouth as needed for smoking cessation.   nicotine polacrilex (NICOTINE MINI) 4 MG lozenge Take 1 lozenge (4 mg total) by mouth as needed for smoking cessation.   senna-docusate (SENOKOT-S) 8.6-50 MG tablet Take 1 tablet by mouth at bedtime as needed for mild constipation.   sertraline (ZOLOFT) 50 MG tablet Take 1 tablet (50 mg total) by mouth daily. Do not sudden stop Start at half tablet first 2 weeks   lamoTRIgine (LAMICTAL) 25 MG tablet TAKE 1 TABLET (25 MG TOTAL) BY MOUTH DAILY FOR 14 DAYS, THEN 2 TABLETS (50 MG TOTAL) DAILY FOR 14 DAYS. FOR BIPOLAR I DISORDER IS 25 MG ONCE DAILY FOR TWO WEEKS, FOLLOWED BY AN INCREASE TO 50 MG ONCE DAILY WITH FOLLOW UP INCREASE TO 100 LIKELY AFTER THAT REASONS FOR DISCONTINUATION, BESIDES RASH, INCLUDE HYPERSENSITIVITY REACTIONS, MULTIORGAN HYPERSENSITIVITY REACTIONS, INCREASED RISK OF SUICIDAL THOUGHTS OR BEHAVIOR, AND SEVERE CENTRAL NERVOUS SYSTEM REACTIONS.Marland Kitchen   naloxone  (NARCAN) nasal spray 4 mg/0.1 mL Place 1 spray into the nose once for 1 dose. Use for over sedation for anyone suspected of overdose.   No current facility-administered medications on file prior to visit.   Medications Discontinued During This Encounter  Medication Reason   cefadroxil (DURICEF) 500 MG capsule Completed Course   meloxicam (MOBIC) 15 MG tablet Completed Course   buprenorphine-naloxone (SUBOXONE) 8-2 mg SUBL SL tablet Reorder     Objective   Physical Exam Constitutional:      Appearance: Normal appearance.  HENT:     Head: Normocephalic and atraumatic.     Nose: Nose normal.  Neurological:     General: No focal deficit present.     Mental Status: He is alert. Mental status is at baseline.  Psychiatric:        Attention and Perception: Attention and perception normal.        Mood and Affect: Mood and affect normal.        Speech: Speech normal.        Behavior: Behavior normal. Behavior is cooperative.        Thought Content: Thought content normal.        Cognition and Memory: Cognition and memory normal.        Judgment: Judgment normal.      Results            No results found for any visits on 07/25/23.  Office Visit on 02/05/2023  Component Date Value   WBC 02/05/2023 8.7    RBC 02/05/2023 5.54  Platelets 02/05/2023 258.0    Hemoglobin 02/05/2023 15.8    HCT 02/05/2023 46.1    MCV 02/05/2023 83.2    MCHC 02/05/2023 34.3    RDW 02/05/2023 13.8    Sodium 02/05/2023 137    Potassium 02/05/2023 4.2    Chloride 02/05/2023 101    CO2 02/05/2023 31    Glucose, Bld 02/05/2023 92    BUN 02/05/2023 14    Creatinine, Ser 02/05/2023 1.04    Total Bilirubin 02/05/2023 0.4    Alkaline Phosphatase 02/05/2023 92    AST 02/05/2023 22    ALT 02/05/2023 36    Total Protein 02/05/2023 7.3    Albumin 02/05/2023 4.7    GFR 02/05/2023 90.14    Calcium 02/05/2023 9.7    Cholesterol 02/05/2023 187    Triglycerides 02/05/2023 312.0 (H)    HDL 02/05/2023  36.10 (L)    VLDL 02/05/2023 62.4 (H)    Total CHOL/HDL Ratio 02/05/2023 5    NonHDL 02/05/2023 151.00    Hepatitis C Ab 02/05/2023 NON-REACTIVE    HIV 1&2 Ab, 4th Generati* 02/05/2023 NON-REACTIVE    Direct LDL 02/05/2023 106.0    TSH 02/05/2023 3.58   Office Visit on 10/18/2022  Component Date Value   Amphetamines, Urine 10/18/2022 Negative    Cannabinoid Quant, Ur 10/18/2022 Negative    Cocaine (Metab.) 10/18/2022 Negative    OPIATE QUANTITATIVE URINE 10/18/2022 Negative    PCP Quant, Ur 10/18/2022 Negative   Office Visit on 09/21/2022  Component Date Value   TSH 09/21/2022 1.94    Magnesium 09/21/2022 2.0    Phosphorus 09/21/2022 3.3    Cholesterol 09/21/2022 209 (H)    Triglycerides 09/21/2022 224.0 (H)    HDL 09/21/2022 31.30 (L)    VLDL 09/21/2022 44.8 (H)    Total CHOL/HDL Ratio 09/21/2022 7    NonHDL 09/21/2022 177.86    Sodium 09/21/2022 138    Potassium 09/21/2022 4.1    Chloride 09/21/2022 101    CO2 09/21/2022 31    Glucose, Bld 09/21/2022 81    BUN 09/21/2022 11    Creatinine, Ser 09/21/2022 0.85    Total Bilirubin 09/21/2022 0.5    Alkaline Phosphatase 09/21/2022 93    AST 09/21/2022 17    ALT 09/21/2022 19    Total Protein 09/21/2022 7.2    Albumin 09/21/2022 4.4    GFR 09/21/2022 109.37    Calcium 09/21/2022 9.5    WBC 09/21/2022 8.4    RBC 09/21/2022 5.56    Platelets 09/21/2022 322.0    Hemoglobin 09/21/2022 16.3    HCT 09/21/2022 47.1    MCV 09/21/2022 84.8    MCHC 09/21/2022 34.6    RDW 09/21/2022 13.3    Hepatitis C Ab 09/21/2022 NON-REACTIVE    Direct LDL 09/21/2022 140.0   Admission on 09/02/2022, Discharged on 09/07/2022  Component Date Value   Sodium 09/03/2022 133 (L)    Potassium 09/03/2022 4.1    Chloride 09/03/2022 101    CO2 09/03/2022 24    Glucose, Bld 09/03/2022 131 (H)    BUN 09/03/2022 10    Creatinine, Ser 09/03/2022 0.72    Calcium 09/03/2022 8.0 (L)    Total Protein 09/03/2022 5.4 (L)    Albumin 09/03/2022 2.1 (L)     AST 09/03/2022 38    ALT 09/03/2022 49 (H)    Alkaline Phosphatase 09/03/2022 80    Total Bilirubin 09/03/2022 0.4    GFR, Estimated 09/03/2022 >60    Anion gap 09/03/2022 8    WBC  09/03/2022 14.6 (H)    RBC 09/03/2022 4.08 (L)    Hemoglobin 09/03/2022 12.7 (L)    HCT 09/03/2022 35.3 (L)    MCV 09/03/2022 86.5    MCH 09/03/2022 31.1    MCHC 09/03/2022 36.0    RDW 09/03/2022 12.8    Platelets 09/03/2022 273    nRBC 09/03/2022 0.0    WBC 09/04/2022 12.1 (H)    RBC 09/04/2022 4.08 (L)    Hemoglobin 09/04/2022 12.1 (L)    HCT 09/04/2022 36.2 (L)    MCV 09/04/2022 88.7    MCH 09/04/2022 29.7    MCHC 09/04/2022 33.4    RDW 09/04/2022 12.7    Platelets 09/04/2022 302    nRBC 09/04/2022 0.0    Sodium 09/06/2022 137    Potassium 09/06/2022 3.5    Chloride 09/06/2022 104    CO2 09/06/2022 24    Glucose, Bld 09/06/2022 135 (H)    BUN 09/06/2022 8    Creatinine, Ser 09/06/2022 0.87    Calcium 09/06/2022 8.0 (L)    GFR, Estimated 09/06/2022 >60    Anion gap 09/06/2022 9    WBC 09/06/2022 10.1    RBC 09/06/2022 3.91 (L)    Hemoglobin 09/06/2022 11.8 (L)    HCT 09/06/2022 33.9 (L)    MCV 09/06/2022 86.7    MCH 09/06/2022 30.2    MCHC 09/06/2022 34.8    RDW 09/06/2022 12.7    Platelets 09/06/2022 341    nRBC 09/06/2022 0.0    WBC 09/07/2022 11.2 (H)    RBC 09/07/2022 4.19 (L)    Hemoglobin 09/07/2022 12.5 (L)    HCT 09/07/2022 36.8 (L)    MCV 09/07/2022 87.8    MCH 09/07/2022 29.8    MCHC 09/07/2022 34.0    RDW 09/07/2022 12.2    Platelets 09/07/2022 378    nRBC 09/07/2022 0.0    Neutrophils Relative % 09/07/2022 66    Neutro Abs 09/07/2022 7.4    Lymphocytes Relative 09/07/2022 19    Lymphs Abs 09/07/2022 2.1    Monocytes Relative 09/07/2022 7    Monocytes Absolute 09/07/2022 0.8    Eosinophils Relative 09/07/2022 5    Eosinophils Absolute 09/07/2022 0.6 (H)    Basophils Relative 09/07/2022 1    Basophils Absolute 09/07/2022 0.1    Immature Granulocytes  09/07/2022 2    Abs Immature Granulocytes 09/07/2022 0.22 (H)    Sodium 09/07/2022 140    Potassium 09/07/2022 4.0    Chloride 09/07/2022 110    CO2 09/07/2022 24    Glucose, Bld 09/07/2022 113 (H)    BUN 09/07/2022 5 (L)    Creatinine, Ser 09/07/2022 0.86    Calcium 09/07/2022 8.5 (L)    Total Protein 09/07/2022 5.2 (L)    Albumin 09/07/2022 2.3 (L)    AST 09/07/2022 28    ALT 09/07/2022 39    Alkaline Phosphatase 09/07/2022 69    Total Bilirubin 09/07/2022 0.6    GFR, Estimated 09/07/2022 >60    Anion gap 09/07/2022 6    Magnesium 09/07/2022 2.0    Phosphorus 09/07/2022 4.0   Admission on 08/30/2022, Discharged on 09/02/2022  Component Date Value   WBC 08/30/2022 25.5 (H)    RBC 08/30/2022 5.46    Hemoglobin 08/30/2022 16.2    HCT 08/30/2022 47.7    MCV 08/30/2022 87.4    MCH 08/30/2022 29.7    MCHC 08/30/2022 34.0    RDW 08/30/2022 13.0    Platelets 08/30/2022 362    nRBC 08/30/2022 0.0  Neutrophils Relative % 08/30/2022 90    Neutro Abs 08/30/2022 23.0 (H)    Lymphocytes Relative 08/30/2022 3    Lymphs Abs 08/30/2022 0.8    Monocytes Relative 08/30/2022 6    Monocytes Absolute 08/30/2022 1.4 (H)    Eosinophils Relative 08/30/2022 0    Eosinophils Absolute 08/30/2022 0.0    Basophils Relative 08/30/2022 0    Basophils Absolute 08/30/2022 0.1    WBC Morphology 08/30/2022 MORPHOLOGY UNREMARKABLE    RBC Morphology 08/30/2022 MORPHOLOGY UNREMARKABLE    Smear Review 08/30/2022 Normal platelet morphology    Immature Granulocytes 08/30/2022 1    Abs Immature Granulocytes 08/30/2022 0.20 (H)    Sodium 08/30/2022 135    Potassium 08/30/2022 4.7    Chloride 08/30/2022 103    CO2 08/30/2022 20 (L)    Glucose, Bld 08/30/2022 150 (H)    BUN 08/30/2022 13    Creatinine, Ser 08/30/2022 0.82    Calcium 08/30/2022 9.5    Total Protein 08/30/2022 8.6 (H)    Albumin 08/30/2022 4.3    AST 08/30/2022 38    ALT 08/30/2022 82 (H)    Alkaline Phosphatase 08/30/2022 104     Total Bilirubin 08/30/2022 1.9 (H)    GFR, Estimated 08/30/2022 >60    Anion gap 08/30/2022 12    Troponin I (High Sensiti* 08/30/2022 4    Specimen Description 08/30/2022 BLOOD RIGHT HAND    Special Requests 08/30/2022 BOTTLES DRAWN AEROBIC AND ANAEROBIC Blood Culture adequate volume    Culture 08/30/2022                     Value:NO GROWTH 5 DAYS Performed at [facility], [address] [place], [place] [address]    Report Status 08/30/2022 09/04/2022 FINAL    Specimen Description 08/30/2022 RIGHT ANTECUBITAL    Special Requests 08/30/2022 BOTTLES DRAWN AEROBIC AND ANAEROBIC Blood Culture results may not be optimal due to an excessive volume of blood received in culture bottles    Culture 08/30/2022                     Value:NO GROWTH 5 DAYS Performed at [facility], [address] [place], [place] [address]    Report Status 08/30/2022 09/04/2022 FINAL    Lactic Acid, Venous 08/30/2022 2.0 (HH)    SARS Coronavirus 2 by RT* 08/30/2022 NEGATIVE    Lactic Acid, Venous 08/30/2022 2.2 (HH)    Troponin I (High Sensiti* 08/30/2022 3    Strep Pneumo Urinary Ant* 08/31/2022 NEGATIVE    L. pneumophila Serogp 1 * 08/31/2022 Negative    Source of Sample 08/31/2022 URINE, RANDOM    MRSA by PCR Next Gen 08/31/2022 NOT DETECTED    WBC 08/31/2022 22.1 (H)    RBC 08/31/2022 4.78    Hemoglobin 08/31/2022 14.0    HCT 08/31/2022 41.9    MCV 08/31/2022 87.7    MCH 08/31/2022 29.3    MCHC 08/31/2022 33.4    RDW 08/31/2022 12.9    Platelets 08/31/2022 288    nRBC 08/31/2022 0.0    Neutrophils Relative % 08/31/2022 87    Neutro Abs 08/31/2022 19.1 (H)    Lymphocytes Relative 08/31/2022 4    Lymphs Abs 08/31/2022 0.9    Monocytes Relative 08/31/2022 8    Monocytes Absolute 08/31/2022 1.8 (H)    Eosinophils Relative 08/31/2022 0    Eosinophils Absolute 08/31/2022 0.0    Basophils Relative 08/31/2022 0    Basophils Absolute 08/31/2022 0.1  Immature Granulocytes  08/31/2022 1    Abs Immature Granulocytes 08/31/2022 0.18 (H)    Sodium 08/31/2022 135    Potassium 08/31/2022 3.7    Chloride 08/31/2022 106    CO2 08/31/2022 21 (L)    Glucose, Bld 08/31/2022 116 (H)    BUN 08/31/2022 15    Creatinine, Ser 08/31/2022 0.78    Calcium 08/31/2022 7.8 (L)    Total Protein 08/31/2022 6.1 (L)    Albumin 08/31/2022 3.1 (L)    AST 08/31/2022 21    ALT 08/31/2022 46 (H)    Alkaline Phosphatase 08/31/2022 70    Total Bilirubin 08/31/2022 2.0 (H)    GFR, Estimated 08/31/2022 >60    Anion gap 08/31/2022 8    Hgb A1c MFr Bld 08/31/2022 4.7 (L)    Mean Plasma Glucose 08/31/2022 88.19    O2 Content 08/30/2022 3.0    Delivery systems 08/30/2022 NASAL CANNULA    pH, Arterial 08/30/2022 7.43    pCO2 arterial 08/30/2022 36    pO2, Arterial 08/30/2022 69 (L)    Bicarbonate 08/30/2022 23.9    Acid-base deficit 08/30/2022 0.1    O2 Saturation 08/30/2022 97.3    Patient temperature 08/30/2022 37.0    Collection site 08/30/2022 RIGHT RADIAL    Allens test (pass/fail) 08/30/2022 PASS    Specimen Description 08/31/2022                     Value:PLEURAL Performed at [facility], [address], [place], [place] [address]    Special Requests 08/31/2022                     Value:NONE Performed at [facility], [address] [place], [place] [address]    Gram Stain 08/31/2022                     Value:MODERATE WBC PRESENT, PREDOMINANTLY PMN NO ORGANISMS SEEN    Culture 08/31/2022                     Value:NO GROWTH 3 DAYS Performed at [facility], [address], [place], [place] [address]    Report Status 08/31/2022 09/03/2022 FINAL    Fluid Type-FCT 08/30/2022 PLEURAL    Color, Fluid 08/30/2022 AMBER (A)    Appearance, Fluid 08/30/2022 CLOUDY (A)    Total Nucleated Cell Cou* 08/30/2022 36,950    Neutrophil Count, Fluid 08/30/2022 96    Lymphs, Fluid 08/30/2022 2    Monocyte-Macrophage-Sero* 08/30/2022 1    Eos, Fluid  08/30/2022 1    Other Cells, Fluid 08/30/2022 RBCS    Glucose, Fluid 08/30/2022 78    Fluid Type-FGLU 08/30/2022 PLEURAL    Total protein, fluid 08/30/2022 5.6    Fluid Type-FTP 08/30/2022 PLEURAL    LD, Fluid 08/30/2022 938 (H)    Fluid Type-FLDH 08/30/2022 PLEURAL    Triglycerides, Fluid 08/30/2022 49    Fluid Type-FTRIG 08/30/2022 PLEURAL    Cholesterol, Fluid 08/30/2022 93    Chol, Fluid Type 08/30/2022 PLEURAL    Albumin 08/31/2022 3.5    Lactic Acid, Venous 08/31/2022 1.5    Lactic Acid, Venous 08/31/2022 1.1    Magnesium 08/31/2022 1.4 (L)    Phosphorus 08/31/2022 2.4 (L)    HIV Screen 4th Generatio* 08/31/2022 Non Reactive    Procalcitonin 08/31/2022 2.15    Color, Urine 08/31/2022 YELLOW (A)    APPearance 08/31/2022 CLEAR (A)    Specific Gravity, Urine 08/31/2022  1.032 (H)    pH 08/31/2022 5.0    Glucose, UA 08/31/2022 NEGATIVE    Hgb urine dipstick 08/31/2022 NEGATIVE    Bilirubin Urine 08/31/2022 NEGATIVE    Ketones, ur 08/31/2022 NEGATIVE    Protein, ur 08/31/2022 NEGATIVE    Nitrite 08/31/2022 NEGATIVE    Leukocytes,Ua 08/31/2022 NEGATIVE    Path Review 08/30/2022 Cytospin of pleural fluid is reviewed.    Glucose-Capillary 08/31/2022 125 (H)    Glucose-Capillary 08/31/2022 122 (H)    Comment 1 08/31/2022 Notify RN    Comment 2 08/31/2022 Document in Chart    Glucose-Capillary 08/31/2022 120 (H)    Glucose-Capillary 08/31/2022 118 (H)    Glucose-Capillary 08/31/2022 135 (H)    Comment 1 08/31/2022 Notify RN    Glucose-Capillary 08/31/2022 112 (H)    WBC 09/01/2022 24.3 (H)    RBC 09/01/2022 4.75    Hemoglobin 09/01/2022 13.9    HCT 09/01/2022 41.1    MCV 09/01/2022 86.5    MCH 09/01/2022 29.3    MCHC 09/01/2022 33.8    RDW 09/01/2022 12.8    Platelets 09/01/2022 277    nRBC 09/01/2022 0.0    Sodium 09/01/2022 130 (L)    Potassium 09/01/2022 4.1    Chloride 09/01/2022 98    CO2 09/01/2022 23    Glucose, Bld 09/01/2022 159 (H)    BUN 09/01/2022 15     Creatinine, Ser 09/01/2022 0.79    Calcium 09/01/2022 8.4 (L)    GFR, Estimated 09/01/2022 >60    Anion gap 09/01/2022 9    Magnesium 09/01/2022 2.0    Phosphorus 09/01/2022 2.4 (L)    Glucose-Capillary 08/31/2022 123 (H)    Glucose-Capillary 08/31/2022 144 (H)    Glucose-Capillary 09/01/2022 116 (H)    Glucose-Capillary 09/01/2022 110 (H)    Glucose-Capillary 09/01/2022 111 (H)    Glucose-Capillary 09/01/2022 115 (H)    Glucose-Capillary 09/02/2022 145 (H)    Glucose-Capillary 09/02/2022 150 (H)    Glucose-Capillary 09/02/2022 97    Glucose-Capillary 09/02/2022 139 (H)    Glucose-Capillary 09/02/2022 151 (H)    Glucose-Capillary 09/02/2022 149 (H)    No image results found.   No results found.  DG CHEST PORT 1 VIEW  Result Date: 09/03/2022 CLINICAL DATA:  Parapneumonic effusion EXAM: PORTABLE CHEST 1 VIEW COMPARISON:  Prior chest x-ray yesterday FINDINGS: Right-sided chest tube remains in place. Slightly decreased right basilar airspace opacity. Persistent streaky opacities in the left lung base. Stable cardiac and mediastinal contours. No pneumothorax. No acute osseous abnormality. IMPRESSION: 1. Stable position of right-sided chest tube. 2. Improving right basilar airspace opacities. 3. Persistent left basilar atelectasis. Electronically Signed   By: Malachy Moan M.D.   On: 09/03/2022 07:58   DG Chest Port 1 View  Result Date: 09/02/2022 CLINICAL DATA:  Pleural effusion. EXAM: PORTABLE CHEST 1 VIEW COMPARISON:  09/01/2022 FINDINGS: There is a right pigtail thoracostomy tube overlying the right mid lung. This is unchanged in position from previous exam. No pneumothorax identified. Unchanged appearance of moderate to large right pleural effusion. Similar appearance of small left pleural effusion with overlying atelectasis. IMPRESSION: 1. Stable position of right pigtail thoracostomy tube. No pneumothorax. 2. Unchanged moderate to large right pleural effusion. Electronically Signed    By: Signa Kell M.D.   On: 09/02/2022 08:15       Additional Info: This encounter employed real-time, collaborative documentation. The patient actively reviewed and updated their medical record on a shared screen, ensuring transparency and facilitating joint problem-solving for the problem  list, overview, and plan. This approach promotes accurate, informed care. The treatment plan was discussed and reviewed in detail, including medication safety, potential side effects, and all patient questions. We confirmed understanding and comfort with the plan. Follow-up instructions were established, including contacting the office for any concerns, returning if symptoms worsen, persist, or new symptoms develop, and precautions for potential emergency department visits.

## 2023-07-25 NOTE — Assessment & Plan Note (Signed)
Stable on suboxone long term now. PDMP reviewed during this encounter. We have given extensive sedative warnings- he denies return to substance use.

## 2023-08-21 ENCOUNTER — Telehealth (INDEPENDENT_AMBULATORY_CARE_PROVIDER_SITE_OTHER): Payer: Medicaid Other | Admitting: Internal Medicine

## 2023-08-21 ENCOUNTER — Encounter: Payer: Self-pay | Admitting: Internal Medicine

## 2023-08-21 VITALS — Wt 174.0 lb

## 2023-08-21 DIAGNOSIS — F319 Bipolar disorder, unspecified: Secondary | ICD-10-CM

## 2023-08-21 DIAGNOSIS — F1129 Opioid dependence with unspecified opioid-induced disorder: Secondary | ICD-10-CM | POA: Diagnosis not present

## 2023-08-21 DIAGNOSIS — Z87891 Personal history of nicotine dependence: Secondary | ICD-10-CM

## 2023-08-21 DIAGNOSIS — J410 Simple chronic bronchitis: Secondary | ICD-10-CM | POA: Diagnosis not present

## 2023-08-21 DIAGNOSIS — K5903 Drug induced constipation: Secondary | ICD-10-CM

## 2023-08-21 DIAGNOSIS — Z79899 Other long term (current) drug therapy: Secondary | ICD-10-CM

## 2023-08-21 DIAGNOSIS — T402X5A Adverse effect of other opioids, initial encounter: Secondary | ICD-10-CM | POA: Diagnosis not present

## 2023-08-21 DIAGNOSIS — F411 Generalized anxiety disorder: Secondary | ICD-10-CM

## 2023-08-21 MED ORDER — LAMOTRIGINE 25 MG PO TABS: 50.0000 mg | ORAL_TABLET | Freq: Every day | ORAL | 3 refills | Status: DC

## 2023-08-21 MED ORDER — BUPRENORPHINE HCL-NALOXONE HCL 8-2 MG SL SUBL
1.5000 | SUBLINGUAL_TABLET | Freq: Every day | SUBLINGUAL | 0 refills | Status: DC
Start: 2023-08-21 — End: 2023-09-17

## 2023-08-21 NOTE — Assessment & Plan Note (Addendum)
No recent flares reported, with a history of smoking. We will continue to monitor for flares and advise him to seek medical attention for any bronchitis flare-ups.

## 2023-08-21 NOTE — Assessment & Plan Note (Addendum)
The last drug screen was in December 2023. We will order a drug screen to be completed within the next two months and schedule a follow-up appointment, either in-person or via video, based on his convenience and completion of the drug screen.

## 2023-08-21 NOTE — Assessment & Plan Note (Addendum)
Stable on Suboxone 12mg  daily with no reported issues. We will refill the Suboxone 12mg  daily prescription and continue to monitor for misuse or diversion.

## 2023-08-21 NOTE — Patient Instructions (Signed)
VISIT SUMMARY:  During your recent visit, we discussed your ongoing treatment for opioid use disorder, constipation, mood disorders, and chronic bronchitis. You reported overall well-being and no new or worsening symptoms. However, you continue to struggle with severe constipation despite taking a stool softener. Your mood is stable and positive on your current regimen of medications. You have stopped smoking and have not had any recent flare-ups of bronchitis.  YOUR PLAN:  -OPIOID USE DISORDER: You are doing well on your current dose of Suboxone, a medication used to treat opioid dependence. We will refill your prescription and continue to monitor your progress.  -CONSTIPATION: Your constipation, likely caused by your Suboxone use, persists despite taking a stool softener. We will add fiber gummies to your regimen and recommend increasing your water intake.  -MOOD DISORDERS: Your mood is stable on your current medications. We will continue these and adjust the dose of Lamictal to match your actual usage.  -CHRONIC BRONCHITIS: You have not had any recent flare-ups of bronchitis since you stopped smoking. We will continue to monitor this and advise you to seek medical attention if you have any flare-ups.  INSTRUCTIONS:  We will order a drug screen to be completed within the next two months. Please schedule a follow-up appointment, either in-person or via video, based on your convenience and completion of the drug screen.  Please remember to upload pictures of pills for helping [place] meet our regulatory requirement(s)  for diversion prevention.

## 2023-08-21 NOTE — Assessment & Plan Note (Addendum)
Stable on a regimen of Lamictal 50mg  daily, Abilify, Wellbutrin, and Zoloft with no reported issues. We will continue the current mood stabilizing medications and adjust the Lamictal prescription to match his actual usage.

## 2023-08-21 NOTE — Progress Notes (Signed)
[facility]: [phone]   Virtual Medical Office Visit - Video Telemedicine   Patient:  Carl Le ([birth year])  MRN:   [id number]      Date:   08/21/2023  Patient Care Team: Lula Olszewski, MD as PCP - General (Internal Medicine) Today's Healthcare Provider: Lula Olszewski, MD   Assessment & Plan  Main reason for visit: Medical Management of Chronic Issues (Pt requesting refill w/o any concerns. SUBOXONE refill.)   Assessment & Plan High risk medication use The last drug screen was in December 2023. We will order a drug screen to be completed within the next two months and schedule a follow-up appointment, either in-person or via video, based on his convenience and completion of the drug screen.  Bipolar 1 disorder (HCC)  Generalized anxiety disorder Stable on a regimen of Lamictal 50mg  daily, Abilify, Wellbutrin, and Zoloft with no reported issues. We will continue the current mood stabilizing medications and adjust the Lamictal prescription to match his actual usage. History of smoking  Opioid dependence with opioid-induced disorder (HCC) Stable on Suboxone 12mg  daily with no reported issues. We will refill the Suboxone 12mg  daily prescription and continue to monitor for misuse or diversion. Simple chronic bronchitis (HCC) No recent flares reported, with a history of smoking. We will continue to monitor for flares and advise him to seek medical attention for any bronchitis flare-ups. Therapeutic opioid induced constipation Persistent constipation despite stool softeners, likely secondary to Suboxone use. We will add fiber gummies and increase water intake while continuing the current stool softener regimen.     High risk medication use -     Buprenorphine HCl-Naloxone HCl; Place 1.5 tablets under the tongue daily for 28 days.  Dispense: 42 tablet; Refill: 0 -     Drug Screen, 5 Panel, Ur; Future  Bipolar 1 disorder (HCC) -     lamoTRIgine;  Take 2 tablets (50 mg total) by mouth daily. for bipolar I disorder is 25 mg once daily for two weeks, followed by an increase to 50 mg once daily with follow up increase to 100 likely after that Reasons for discontinuation, besides rash, include hypersensitivity reactions, multiorgan hypersensitivity reactions,  increased risk of suicidal thoughts or behavior, and severe central nervous system reactions.  Dispense: 180 tablet; Refill: 3    Treatment plan discussed and reviewed in detail. Explained medication safety and potential side effects.  Answered all patient questions and confirmed understanding and comfort with the plan. Encouraged patient to contact our office if they have any questions or concerns.  Agreed on patient coming for a sooner office visit if symptoms worsen, persist, or new symptoms develop. Discussed precautions in case of needing to visit the Emergency Department.     Subjective:   Chief Complaint / Reason for Visit:  Medical Management of Chronic Issues (Pt requesting refill w/o any concerns. SUBOXONE refill.)   42 y.o. male  has a past medical history of Allergy, Anxiety disorder (09/21/2022), Chest pain (09/21/2022), Community acquired pneumonia of right lower lobe of lung (08/30/2022), Current smoker, Depression, Disability examination (09/21/2022), Herpes zoster, High risk medication use (10/26/2022), Kidney calculus, Kidney stone (04/15/2014), Mild emphysema (HCC) (08/30/2022), Normocytic anemia (09/07/2022), Parapneumonic effusion (08/31/2022), Pneumonia (2023), Tobacco dependence (09/21/2022), and Tobacco use (09/02/2022).   Discussed the use of AI scribe software for clinical note transcription with the patient, who gave verbal consent to proceed.  History of Present Illness   The patient, currently on Suboxone 12mg  daily, presents for a routine  medication refill. He reports overall well-being, with no new or worsening symptoms. However, he continues to struggle with  severe constipation despite taking a generic form of Senokot. He has added fiber gummies and increased water intake to his regimen in an attempt to manage this issue.  The patient has a history of smoking, anxiety, bipolar disorder, and recurring pneumonia. He reports cessation of his chronic cough, which was previously attributed to smoking. His mood is reported as stable and positive on his current regimen of Lamictal, Abilify, Wellbutrin, and Zoloft. He is taking 50mg  of Lamictal daily, despite the prescribed dose being higher, due to difficulty remembering multi-dosing.  The patient has been adhering to his medication regimen and has not reported any new medical issues. He has not had any recent drug screens and is due for one in the next two months. He has not sought medical care from any other providers.        Reviewed chart data: has Anxiety disorder; Nicotine dependence; Chronic bronchitis (HCC); Opioid dependence (HCC); High risk medication use; History of incarceration; and Bipolar 1 disorder (HCC) on their problem list. ARIPiprazole, buPROPion, buprenorphine-naloxone, ciclopirox, hydrOXYzine, lamoTRIgine, naloxone, nicotine, nicotine polacrilex, senna-docusate, and sertraline       Objective:  Physical Exam           General Appearance:  Well Developed, Well Nourished, No Acute Distress by Limited Video Assessment Pulmonary:  No Respiratory Distress Apparent. Normal Work of Breathing.   Neurological:  Awake, Alert. No Obvious Focal Neurological Deficits or Cognitive Impairments.  Sensorium Seems Unclouded. Psychiatric:  Appropriate Mood, Pleasant Demeanor, Calm, Articulate, Good Mood  Results Reviewed:         ------------------------------------------------------ Attestation:  Today's Healthcare Provider Lula Olszewski, MD was located at office at [facility] [address], [place] [address]. The patient was located at home. Today's  Telemedicine visit was conducted via Video  after consent for telemedicine was obtained:  Video connection was never lost All video encounter participant identities and locations confirmed visually and verbally.  Signed: Lula Olszewski, MD 08/21/2023 10:26 AM

## 2023-09-07 DIAGNOSIS — Z419 Encounter for procedure for purposes other than remedying health state, unspecified: Secondary | ICD-10-CM | POA: Diagnosis not present

## 2023-09-17 ENCOUNTER — Encounter: Payer: Self-pay | Admitting: Internal Medicine

## 2023-09-17 ENCOUNTER — Ambulatory Visit (INDEPENDENT_AMBULATORY_CARE_PROVIDER_SITE_OTHER): Payer: Medicaid Other | Admitting: Internal Medicine

## 2023-09-17 VITALS — BP 133/81 | HR 75 | Temp 98.4°F | Ht 72.0 in | Wt 172.6 lb

## 2023-09-17 DIAGNOSIS — J410 Simple chronic bronchitis: Secondary | ICD-10-CM

## 2023-09-17 DIAGNOSIS — T402X5A Adverse effect of other opioids, initial encounter: Secondary | ICD-10-CM | POA: Insufficient documentation

## 2023-09-17 DIAGNOSIS — F319 Bipolar disorder, unspecified: Secondary | ICD-10-CM

## 2023-09-17 DIAGNOSIS — K5903 Drug induced constipation: Secondary | ICD-10-CM

## 2023-09-17 DIAGNOSIS — F411 Generalized anxiety disorder: Secondary | ICD-10-CM

## 2023-09-17 DIAGNOSIS — Z79899 Other long term (current) drug therapy: Secondary | ICD-10-CM

## 2023-09-17 DIAGNOSIS — Z87891 Personal history of nicotine dependence: Secondary | ICD-10-CM

## 2023-09-17 DIAGNOSIS — Z23 Encounter for immunization: Secondary | ICD-10-CM

## 2023-09-17 DIAGNOSIS — F1129 Opioid dependence with unspecified opioid-induced disorder: Secondary | ICD-10-CM | POA: Diagnosis not present

## 2023-09-17 MED ORDER — HYDROXYZINE HCL 50 MG PO TABS
50.0000 mg | ORAL_TABLET | Freq: Every day | ORAL | 1 refills | Status: DC | PRN
Start: 2023-09-17 — End: 2024-02-13

## 2023-09-17 MED ORDER — BUPRENORPHINE HCL-NALOXONE HCL 8-2 MG SL SUBL: 1.5000 | SUBLINGUAL_TABLET | Freq: Every day | SUBLINGUAL | 1 refills | Status: DC

## 2023-09-17 MED ORDER — LAMOTRIGINE 50 MG PO TBDP
2.0000 | ORAL_TABLET | Freq: Every day | ORAL | 3 refills | Status: DC
Start: 2023-09-17 — End: 2024-02-13

## 2023-09-17 NOTE — Assessment & Plan Note (Addendum)
Bipolar Disorder   He is on Lamictal 50 mg once daily, which has resulted in good mood stabilization, reduced anxiety, and controlled mania. We discussed the option to increase the dose to 100 mg daily if needed, noting the risk of potential rash and the need for slow titration. He prefers to stay at 50 mg for now. We will continue Lamictal 50 mg tablets with the option to increase to 100 mg daily and prescribe a 90-day supply with three refills.  Major Depressive Disorder   He will continue his current medications: Wellbutrin, Abilify, and Zoloft.

## 2023-09-17 NOTE — Assessment & Plan Note (Addendum)
Nicotine Dependence   He has transitioned from nicotine patches to gum and reports an adequate supply of gum. We will continue nicotine gum as needed.

## 2023-09-17 NOTE — Assessment & Plan Note (Addendum)
General Health Maintenance   He is due for flu and pneumonia vaccinations. We discussed the importance of these vaccinations. He will receive the flu shot today and check for pneumonia shot availability at the pharmacy. We recommend the pneumonia shot at the pharmacy if available.

## 2023-09-17 NOTE — Assessment & Plan Note (Addendum)
Constipation   He is managing with fiber supplements since insurance does not cover Senokot. We will continue fiber supplements.

## 2023-09-17 NOTE — Patient Instructions (Signed)
VISIT SUMMARY:  During today's visit, we reviewed your current medication regimen and overall health status. You have shown significant improvements in behavior and health, particularly with mood stabilization and smoking cessation. We discussed your ongoing management of opioid use disorder, bipolar disorder, major depressive disorder, nicotine dependence, and constipation. We also addressed your general health maintenance needs, including vaccinations.  YOUR PLAN:  -OPIOID USE DISORDER: Opioid use disorder is a condition where there is a problematic pattern of opioid use leading to significant impairment or distress. You are managing this well with Suboxone, and we will continue your current dosage of 1.5 tablets per 28 days. Please continue to send pictures of your pill bottles regularly, and we will perform a drug screen today. We also refreshed your Suboxone contract.  -BIPOLAR DISORDER: Bipolar disorder is a mental health condition characterized by extreme mood swings, including emotional highs (mania) and lows (depression). You are currently on Lamictal 50 mg once daily, which has stabilized your mood and reduced anxiety. We discussed the option to increase the dose to 100 mg daily if needed, but you prefer to stay at 50 mg for now. We will continue with your current dosage and provide a 90-day supply with three refills.  -MAJOR DEPRESSIVE DISORDER: Major depressive disorder is a mental health condition characterized by persistent feelings of sadness and loss of interest. You will continue your current medications: Wellbutrin, Abilify, and Zoloft.  -NICOTINE DEPENDENCE: Nicotine dependence is an addiction to tobacco products caused by the drug nicotine. You have successfully transitioned from nicotine patches to gum and have an adequate supply. We will continue with the nicotine gum as needed.  -CONSTIPATION: Constipation is a condition where you have infrequent or difficult bowel movements. You  are managing this with fiber supplements since your insurance does not cover Senokot. We will continue with the fiber supplements.  -GENERAL HEALTH MAINTENANCE: For your general health, you are due for flu and pneumonia vaccinations. You will receive the flu shot today, and we recommend checking for the pneumonia shot at the pharmacy. If available, please get the pneumonia shot at the pharmacy.  INSTRUCTIONS:  Please schedule a follow-up appointment in 56 days. Continue to send pictures of your Suboxone pill bottles regularly and maintain your current medication regimen. Receive the flu shot today and check for the pneumonia shot at the pharmacy.

## 2023-09-17 NOTE — Assessment & Plan Note (Addendum)
Opioid Use Disorder   Well-managed on Suboxone 1.5 tablets per 28 days, he shows excellent compliance and behavioral improvements. We discussed the risks of abrupt discontinuation, including severe withdrawal and potential relapse, and acknowledged that long-term use may cause constipation and gastrointestinal issues, which are preferable to the risks of relapse. We will continue Suboxone 1.5 tablets per 28 days with a refill, require him to send pictures of pill bottles regularly, perform a drug screen today, and refresh the Suboxone contract.

## 2023-09-17 NOTE — Progress Notes (Addendum)
[facility]: [phone]   -- Medical Office Visit --  Patient:  Carl Le      Age: 42 y.o.       Sex:  male  Date:   09/17/2023 Today's Healthcare Provider: Lula Olszewski, MD  ==========================================================================    Addend 10/15/23: I sent end date wrong for his suboxone so the refill was wrong on original script and needed resent. Resent the 2nd month medication(s) that would have been the refill.  Assessment & Plan Opioid dependence with opioid-induced disorder (HCC) Opioid Use Disorder   Well-managed on Suboxone 1.5 tablets per 28 days, he shows excellent compliance and behavioral improvements. We discussed the risks of abrupt discontinuation, including severe withdrawal and potential relapse, and acknowledged that long-term use may cause constipation and gastrointestinal issues, which are preferable to the risks of relapse. We will continue Suboxone 1.5 tablets per 28 days with a refill, require him to send pictures of pill bottles regularly, perform a drug screen today, and refresh the Suboxone contract. Need for influenza vaccination  High risk medication use  Generalized anxiety disorder  Bipolar 1 disorder (HCC)  History of smoking Nicotine Dependence   He has transitioned from nicotine patches to gum and reports an adequate supply of gum. We will continue nicotine gum as needed. Therapeutic opioid induced constipation Constipation   He is managing with fiber supplements since insurance does not cover Senokot. We will continue fiber supplements. Simple chronic bronchitis (HCC) General Health Maintenance   He is due for flu and pneumonia vaccinations. We discussed the importance of these vaccinations. He will receive the flu shot today and check for pneumonia shot availability at the pharmacy. We recommend the pneumonia shot at the pharmacy if available.  Diagnoses and all orders for this  visit: Opioid dependence with opioid-induced disorder (HCC) -     buprenorphine-naloxone (SUBOXONE) 8-2 mg SUBL SL tablet; Place 1.5 tablets under the tongue daily. -     Drug Screen, 5 Panel, Ur Need for influenza vaccination High risk medication use -     buprenorphine-naloxone (SUBOXONE) 8-2 mg SUBL SL tablet; Place 1.5 tablets under the tongue daily. -     lamoTRIgine 50 MG TBDP; Take 2 tablets (100 mg total) by mouth daily at 6 (six) AM. -     Drug Screen, 5 Panel, Ur -     hydrOXYzine (ATARAX) 50 MG tablet; Take 1 tablet (50 mg total) by mouth daily as needed (anxiety- will make sleepy). Generalized anxiety disorder -     hydrOXYzine (ATARAX) 50 MG tablet; Take 1 tablet (50 mg total) by mouth daily as needed (anxiety- will make sleepy). Bipolar 1 disorder (HCC) -     lamoTRIgine 50 MG TBDP; Take 2 tablets (100 mg total) by mouth daily at 6 (six) AM. History of smoking Therapeutic opioid induced constipation Simple chronic bronchitis (HCC)  Recommended follow-up: Return in about 8 weeks (around 11/12/2023) for review problems and medications, chronic disease monitoring and management.No future appointments.Patient Care Team: Lula Olszewski, MD as PCP - General (Internal Medicine)    SUBJECTIVE: 42 y.o. male who has Anxiety disorder; History of smoking; Chronic bronchitis (HCC); Opioid dependence (HCC); High risk medication use; History of incarceration; Bipolar 1 disorder (HCC); and Therapeutic opioid induced constipation on their problem list.. Main reasons for visit/main concerns/chief complaint: Drug Problem (Pt needs refill on Suboxone, here to take Drug test, pt unable to get flu vaccine due to insurance/)   ------------------------------------------------------------------------------------------------------------------------ AI-Extracted: Discussed the use  of AI scribe software for clinical note transcription with the patient, who gave verbal consent to proceed.  History of  Present Illness   The patient, with a history of substance use disorder, is currently on a stable regimen of Suboxone 1.5 tablets per 28 days, Wellbutrin, Abilify, Zoloft, Lamictal, and Atarax. He has been compliant with his medication regimen and has demonstrated significant behavioral improvements, including cessation of smoking and distancing himself from individuals associated with his past substance use.  The patient reports satisfaction with his current medication regimen, particularly noting mood stabilization and reduced anxiety with Lamictal.  The patient also reports occasional use of Atarax, primarily for sleep. He has ceased use of Senokot due to insurance coverage issues but is managing constipation with a fiber supplement.  The patient is also using nicotine gum as a smoking cessation aid, having discontinued the use of nicotine patches. He reports no current respiratory issues, a significant improvement from a year ago when he was at risk of pneumonia due to smoking.  The patient is also on a regimen of Suboxone for substance use disorder management. He has been compliant with this medication and has been providing regular pill counts. The patient understands the importance of gradual changes in his Suboxone dosage and has expressed no immediate desire to discontinue the medication.  The patient has demonstrated significant self-care and behavioral improvements, leading to a marked improvement in his overall health status. He has expressed satisfaction with his current state of health and his medication regimen.       Note that patient  has a past medical history of Allergy, Anxiety disorder (09/21/2022), Chest pain (09/21/2022), Community acquired pneumonia of right lower lobe of lung (08/30/2022), Current smoker, Depression, Disability examination (09/21/2022), Herpes zoster, High risk medication use (10/26/2022), Kidney calculus, Kidney stone (04/15/2014), Mild emphysema (HCC)  (08/30/2022), Normocytic anemia (09/07/2022), Parapneumonic effusion (08/31/2022), Pneumonia (2023), Tobacco dependence (09/21/2022), and Tobacco use (09/02/2022).  Problem list overviews that were updated at today's visit: Problem  Therapeutic Opioid Induced Constipation  Bipolar 1 Disorder (Hcc)   Increased Abilify with no benefit, taking 5 mg now   Prior history: History substance use disorder, and also history things like spending at thousand dollars on lottery tickets so this was speculative diagnosis for which we I resumed Abilify which he had self discontinued prior.    Opioid Dependence (Hcc)    no cravings or relapses, suboxone medication(s) working well 12 mg daily since starting 02/2023. Prior history: Remote history  of legal issues associated with opioid use disorder, and remote history iatrogenic dependence devolving to street use. Was hospitalized due to severe pneumonia with chest pain leading to resumption of percocet that made him fear return to opioid misuse, has done well with suboxone.  Has been very forthcoming about this issue - probation officer had concern(s) about suboxone but he felt high risk to return to use without it after hospitalization with severe pneumonia resulted in a lot of chest pain late 2023.   Chronic Bronchitis (Hcc)   Has tried to stop smoking Has had severe pneumonia history   History of Smoking   Patient reports down to just nicotine replacement therapy doing well History severe pneumonia.       Med reconciliation: Current Outpatient Medications on File Prior to Visit  Medication Sig   ARIPiprazole (ABILIFY) 5 MG tablet Take 1 tablet (5 mg total) by mouth daily.   buPROPion (WELLBUTRIN XL) 300 MG 24 hr tablet Take 1 tablet (300 mg total)  by mouth daily.   ciclopirox (LOPROX) 0.77 % cream Apply topically 2 (two) times daily.   nicotine polacrilex (NICORETTE) 4 MG gum Take 1 each (4 mg total) by mouth as needed for smoking cessation.    nicotine polacrilex (NICOTINE MINI) 4 MG lozenge Take 1 lozenge (4 mg total) by mouth as needed for smoking cessation.   sertraline (ZOLOFT) 50 MG tablet Take 1 tablet (50 mg total) by mouth daily. Do not sudden stop Start at half tablet first 2 weeks   naloxone (NARCAN) nasal spray 4 mg/0.1 mL Place 1 spray into the nose once for 1 dose. Use for over sedation for anyone suspected of overdose.   No current facility-administered medications on file prior to visit.   Medications Discontinued During This Encounter  Medication Reason   nicotine (NICODERM CQ - DOSED IN MG/24 HOURS) 21 mg/24hr patch Completed Course   senna-docusate (SENOKOT-S) 8.6-50 MG tablet    lamoTRIgine (LAMICTAL) 25 MG tablet Dose change   hydrOXYzine (ATARAX) 50 MG tablet Reorder   buprenorphine-naloxone (SUBOXONE) 8-2 mg SUBL SL tablet Reorder     Objective   Physical Exam     09/17/2023    7:55 AM 08/21/2023    9:25 AM 02/05/2023    4:23 PM  Vitals with BMI  Height 6\' 0"   6\' 0"   Weight 172 lbs 10 oz 174 lbs 186 lbs 10 oz  BMI 23.4  25.3  Systolic 133  124  Diastolic 81  86  Pulse 75  73   Wt Readings from Last 10 Encounters:  09/17/23 172 lb 9.6 oz (78.3 kg)  08/21/23 174 lb (78.9 kg)  02/05/23 186 lb 9.6 oz (84.6 kg)  10/26/22 170 lb (77.1 kg)  10/18/22 180 lb 3.2 oz (81.7 kg)  09/21/22 166 lb 3.2 oz (75.4 kg)  09/03/22 176 lb 9.4 oz (80.1 kg)  08/30/22 170 lb (77.1 kg)   Vital signs reviewed.  Nursing notes reviewed. Weight trend reviewed. Abnormalities and Problem-Specific physical exam findings:    General Appearance:  No acute distress appreciable.   Well-groomed, healthy-appearing male.  Well proportioned with no abnormal fat distribution.  Good muscle tone. Pulmonary:  Normal work of breathing at rest, no respiratory distress apparent. SpO2: 98 %  Musculoskeletal: All extremities are intact.  Neurological:  Awake, alert, oriented, and engaged.  No obvious focal neurological deficits or cognitive  impairments.  Sensorium seems unclouded.   Speech is clear and coherent with logical content. Psychiatric:  Appropriate mood, pleasant and cooperative demeanor, thoughtful and engaged during the exam  Results   LABS Triglycerides: elevated (02/2023) HDL: decreased (02/2023) VLDL: elevated (02/2023)        No results found for any visits on 09/17/23.  Office Visit on 02/05/2023  Component Date Value   WBC 02/05/2023 8.7    RBC 02/05/2023 5.54    Platelets 02/05/2023 258.0    Hemoglobin 02/05/2023 15.8    HCT 02/05/2023 46.1    MCV 02/05/2023 83.2    MCHC 02/05/2023 34.3    RDW 02/05/2023 13.8    Sodium 02/05/2023 137    Potassium 02/05/2023 4.2    Chloride 02/05/2023 101    CO2 02/05/2023 31    Glucose, Bld 02/05/2023 92    BUN 02/05/2023 14    Creatinine, Ser 02/05/2023 1.04    Total Bilirubin 02/05/2023 0.4    Alkaline Phosphatase 02/05/2023 92    AST 02/05/2023 22    ALT 02/05/2023 36    Total Protein 02/05/2023 7.3  Albumin 02/05/2023 4.7    GFR 02/05/2023 90.14    Calcium 02/05/2023 9.7    Cholesterol 02/05/2023 187    Triglycerides 02/05/2023 312.0 (H)    HDL 02/05/2023 36.10 (L)    VLDL 02/05/2023 62.4 (H)    Total CHOL/HDL Ratio 02/05/2023 5    NonHDL 02/05/2023 151.00    Hepatitis C Ab 02/05/2023 NON-REACTIVE    HIV 1&2 Ab, 4th Generati* 02/05/2023 NON-REACTIVE    Direct LDL 02/05/2023 106.0    TSH 02/05/2023 3.58   Office Visit on 10/18/2022  Component Date Value   Amphetamines, Urine 10/18/2022 Negative    Cannabinoid Quant, Ur 10/18/2022 Negative    Cocaine (Metab.) 10/18/2022 Negative    OPIATE QUANTITATIVE URINE 10/18/2022 Negative    PCP Quant, Ur 10/18/2022 Negative   Office Visit on 09/21/2022  Component Date Value   TSH 09/21/2022 1.94    Magnesium 09/21/2022 2.0    Phosphorus 09/21/2022 3.3    Cholesterol 09/21/2022 209 (H)    Triglycerides 09/21/2022 224.0 (H)    HDL 09/21/2022 31.30 (L)    VLDL 09/21/2022 44.8 (H)    Total CHOL/HDL  Ratio 09/21/2022 7    NonHDL 09/21/2022 177.86    Sodium 09/21/2022 138    Potassium 09/21/2022 4.1    Chloride 09/21/2022 101    CO2 09/21/2022 31    Glucose, Bld 09/21/2022 81    BUN 09/21/2022 11    Creatinine, Ser 09/21/2022 0.85    Total Bilirubin 09/21/2022 0.5    Alkaline Phosphatase 09/21/2022 93    AST 09/21/2022 17    ALT 09/21/2022 19    Total Protein 09/21/2022 7.2    Albumin 09/21/2022 4.4    GFR 09/21/2022 109.37    Calcium 09/21/2022 9.5    WBC 09/21/2022 8.4    RBC 09/21/2022 5.56    Platelets 09/21/2022 322.0    Hemoglobin 09/21/2022 16.3    HCT 09/21/2022 47.1    MCV 09/21/2022 84.8    MCHC 09/21/2022 34.6    RDW 09/21/2022 13.3    Hepatitis C Ab 09/21/2022 NON-REACTIVE    Direct LDL 09/21/2022 140.0    No image results found.   No results found.  DG CHEST PORT 1 VIEW  Result Date: 09/03/2022 CLINICAL DATA:  Parapneumonic effusion EXAM: PORTABLE CHEST 1 VIEW COMPARISON:  Prior chest x-ray yesterday FINDINGS: Right-sided chest tube remains in place. Slightly decreased right basilar airspace opacity. Persistent streaky opacities in the left lung base. Stable cardiac and mediastinal contours. No pneumothorax. No acute osseous abnormality. IMPRESSION: 1. Stable position of right-sided chest tube. 2. Improving right basilar airspace opacities. 3. Persistent left basilar atelectasis. Electronically Signed   By: Malachy Moan M.D.   On: 09/03/2022 07:58   DG Chest Port 1 View  Result Date: 09/02/2022 CLINICAL DATA:  Pleural effusion. EXAM: PORTABLE CHEST 1 VIEW COMPARISON:  09/01/2022 FINDINGS: There is a right pigtail thoracostomy tube overlying the right mid lung. This is unchanged in position from previous exam. No pneumothorax identified. Unchanged appearance of moderate to large right pleural effusion. Similar appearance of small left pleural effusion with overlying atelectasis. IMPRESSION: 1. Stable position of right pigtail thoracostomy tube. No  pneumothorax. 2. Unchanged moderate to large right pleural effusion. Electronically Signed   By: Signa Kell M.D.   On: 09/02/2022 08:15     Additional Info: This encounter employed real-time, collaborative documentation. The patient actively reviewed and updated their medical record on a shared screen, ensuring transparency and facilitating joint problem-solving for the problem  list, overview, and plan. This approach promotes accurate, informed care. The treatment plan was discussed and reviewed in detail, including medication safety, potential side effects, and all patient questions. We confirmed understanding and comfort with the plan. Follow-up instructions were established, including contacting the office for any concerns, returning if symptoms worsen, persist, or new symptoms develop, and precautions for potential emergency department visits.

## 2023-09-18 LAB — DRUG SCREEN, 5 PANEL, UR
Amphetamines, Urine: NEGATIVE ng/mL
Cannabinoid Quant, Ur: NEGATIVE ng/mL
Cocaine (Metab.): NEGATIVE ng/mL
OPIATE QUANTITATIVE URINE: NEGATIVE ng/mL
PCP Quant, Ur: NEGATIVE ng/mL

## 2023-10-07 DIAGNOSIS — Z419 Encounter for procedure for purposes other than remedying health state, unspecified: Secondary | ICD-10-CM | POA: Diagnosis not present

## 2023-10-15 ENCOUNTER — Telehealth: Payer: Self-pay | Admitting: Internal Medicine

## 2023-10-15 MED ORDER — BUPRENORPHINE HCL-NALOXONE HCL 8-2 MG SL SUBL: 1.5000 | SUBLINGUAL_TABLET | Freq: Every day | SUBLINGUAL | 0 refills | Status: DC

## 2023-10-15 NOTE — Addendum Note (Signed)
Addended by: Lula Olszewski on: 10/15/2023 07:43 PM   Modules accepted: Orders

## 2023-10-15 NOTE — Telephone Encounter (Signed)
Patient states [facility] informed him that his suboxene was sent in as a 90 day supply but it needs to be sent in as a 30 day supply. Pt requests this be corrected and re-sent. States he was informed he has to have PCP fix this.

## 2023-10-16 NOTE — Telephone Encounter (Signed)
Patient called back stating pharmacy informed him that medication has been worked out and he will be able to pick up in an hour. Please disregard previous message.

## 2023-10-16 NOTE — Telephone Encounter (Signed)
Patient states Pharmacy advised him that it is too soon for him to receive new RX. Requests to be called to discuss.  Patient states he is completely out of Suboxone

## 2023-10-16 NOTE — Telephone Encounter (Signed)
noted 

## 2023-11-05 ENCOUNTER — Encounter: Payer: Self-pay | Admitting: Internal Medicine

## 2023-11-05 ENCOUNTER — Telehealth (INDEPENDENT_AMBULATORY_CARE_PROVIDER_SITE_OTHER): Payer: Medicaid Other | Admitting: Internal Medicine

## 2023-11-05 DIAGNOSIS — F319 Bipolar disorder, unspecified: Secondary | ICD-10-CM | POA: Diagnosis not present

## 2023-11-05 DIAGNOSIS — Z79899 Other long term (current) drug therapy: Secondary | ICD-10-CM | POA: Diagnosis not present

## 2023-11-05 DIAGNOSIS — F1129 Opioid dependence with unspecified opioid-induced disorder: Secondary | ICD-10-CM

## 2023-11-05 DIAGNOSIS — IMO0001 Reserved for inherently not codable concepts without codable children: Secondary | ICD-10-CM

## 2023-11-05 DIAGNOSIS — J441 Chronic obstructive pulmonary disease with (acute) exacerbation: Secondary | ICD-10-CM

## 2023-11-05 DIAGNOSIS — F17200 Nicotine dependence, unspecified, uncomplicated: Secondary | ICD-10-CM | POA: Diagnosis not present

## 2023-11-05 DIAGNOSIS — J410 Simple chronic bronchitis: Secondary | ICD-10-CM | POA: Diagnosis not present

## 2023-11-05 MED ORDER — BUPRENORPHINE HCL-NALOXONE HCL 8-2 MG SL SUBL: 1.5000 | SUBLINGUAL_TABLET | Freq: Every day | SUBLINGUAL | 0 refills | Status: DC

## 2023-11-05 MED ORDER — BUPROPION HCL ER (XL) 150 MG PO TB24: 150.0000 mg | ORAL_TABLET | Freq: Every day | ORAL | 3 refills | Status: DC

## 2023-11-05 MED ORDER — AZITHROMYCIN 250 MG PO TABS
ORAL_TABLET | ORAL | 0 refills | Status: AC
Start: 2023-11-05 — End: 2023-11-10

## 2023-11-05 MED ORDER — BUPROPION HCL ER (XL) 300 MG PO TB24: 300.0000 mg | ORAL_TABLET | Freq: Every day | ORAL | 3 refills | Status: DC

## 2023-11-05 MED ORDER — PREDNISONE 20 MG PO TABS
ORAL_TABLET | ORAL | 0 refills | Status: DC
Start: 2023-11-05 — End: 2023-12-12

## 2023-11-05 NOTE — Assessment & Plan Note (Signed)
Substance Use Disorder (Opioid Dependence) They are managing well on a monthly Suboxone regimen, reporting its effectiveness and no issues with adherence. They have agreed to upload monthly tablet counts for legal protection and to reduce visit frequency. We will continue Suboxone 8 mg tablets at 1.5 tablets daily, totaling 45 tablets for 30 days. They will upload monthly tablet counts to receive two months of Suboxone at a time.

## 2023-11-05 NOTE — Progress Notes (Signed)
==============================  [facility]: [phone]   Virtual Medical Office Visit - Video Telemedicine   Patient:  Carl Le (12/20/1980) located at home MRN:   [id number]      Date:   11/05/2023  PCP:    Lula Olszewski, MD   Today's Healthcare Provider: Lula Olszewski, MD located at office: [facility] at [facility] [address] [place] [address] Today's Telemedicine visit was conducted via Video for 45m 43s after consent for telemedicine was obtained:  Video connection was never lost All video encounter participant identities and locations confirmed visually and verbally. ==============================   CHIEF COMPLAINT: Medication follow-up (Wants to discuss if it's okay to take both 150 mg and 300 mg of bupropion.) and Medication Refill (Bupropion-150 mg and 300 mg.)     SUBJECTIVE: 42 y.o. male who has Anxiety disorder; History of smoking; Chronic bronchitis (HCC); Opioid dependence (HCC); High risk medication use; History of incarceration; Bipolar 1 disorder (HCC); and Therapeutic opioid induced constipation on their problem list.  History of Present Illness The patient, currently on a regimen of Suboxone for opioid use disorder management, reports that their medication is working well. They deny any recent flares of their bipolar disorder or anxiety. However, they have been experiencing symptoms of bronchitis, including a persistent cough and severe sore throat, which they have been self-treating with cough syrup and cough drops. They have a history of smoking, which has resulted in chronic bronchitis and COPD.  The patient also reports being on a combination of medications for mood stabilization, including Abilify, Wellbutrin, Zoloft, and Lamictal. They express satisfaction with their current mood and deny any recent manic episodes. They acknowledge a past issue with gambling, which they associate with  manic episodes, but report that this has been under better control recently.  The patient is also on a regimen of bupropion, initially prescribed at 150mg  and later increased to 300mg . They report taking both doses, for a total of 450mg  daily, and express that this regimen is working well for them. They also report that they have completely quit smoking.  The patient is currently employed and reports job security. They express overall satisfaction with their current state of health and recovery.   Past Medical History - Bipolar I - Anxiety - Chronic bronchitis - History of smoking   Note that patient  has a past medical history of Allergy, Anxiety disorder (09/21/2022), Chest pain (09/21/2022), Community acquired pneumonia of right lower lobe of lung (08/30/2022), Current smoker, Depression, Disability examination (09/21/2022), Herpes zoster, High risk medication use (10/26/2022), Kidney calculus, Kidney stone (04/15/2014), Mild emphysema (HCC) (08/30/2022), Normocytic anemia (09/07/2022), Parapneumonic effusion (08/31/2022), Pneumonia (2023), Tobacco dependence (09/21/2022), and Tobacco use (09/02/2022).  Problem list overviews that were updated at today's visit: Problem  Chronic Bronchitis (Hcc)   Has tried to stop smoking Has had severe pneumonia history with hospitalization       Med reconciliation: Current Outpatient Medications on File Prior to Visit  Medication Sig   ARIPiprazole (ABILIFY) 5 MG tablet Take 1 tablet (5 mg total) by mouth daily.   ciclopirox (LOPROX) 0.77 % cream Apply topically 2 (two) times daily.   hydrOXYzine (ATARAX) 50 MG tablet Take 1 tablet (50 mg total) by mouth daily as needed (anxiety- will make sleepy).   lamoTRIgine 50 MG TBDP Take 2 tablets (100 mg total) by mouth daily at 6 (six) AM.   nicotine polacrilex (NICORETTE) 4 MG gum Take 1 each (  4 mg total) by mouth as needed for smoking cessation.   nicotine polacrilex (NICOTINE MINI) 4 MG lozenge Take 1  lozenge (4 mg total) by mouth as needed for smoking cessation.   sertraline (ZOLOFT) 50 MG tablet Take 1 tablet (50 mg total) by mouth daily. Do not sudden stop Start at half tablet first 2 weeks   naloxone (NARCAN) nasal spray 4 mg/0.1 mL Place 1 spray into the nose once for 1 dose. Use for over sedation for anyone suspected of overdose.   No current facility-administered medications on file prior to visit.   Medications Discontinued During This Encounter  Medication Reason   buPROPion (WELLBUTRIN XL) 300 MG 24 hr tablet Reorder   buprenorphine-naloxone (SUBOXONE) 8-2 mg SUBL SL tablet Reorder   buPROPion (WELLBUTRIN XL) 150 MG 24 hr tablet Reorder      Objective   Physical Exam     09/17/2023    7:55 AM 08/21/2023    9:25 AM 02/05/2023    4:23 PM  Vitals with BMI  Height 6\' 0"   6\' 0"   Weight 172 lbs 10 oz 174 lbs 186 lbs 10 oz  BMI 23.4  25.3  Systolic 133  124  Diastolic 81  86  Pulse 75  73   Last CBC Lab Results  Component Value Date   WBC 8.7 02/05/2023   HGB 15.8 02/05/2023   HCT 46.1 02/05/2023   MCV 83.2 02/05/2023   MCH 29.8 09/07/2022   RDW 13.8 02/05/2023   PLT 258.0 02/05/2023   Last metabolic panel Lab Results  Component Value Date   GLUCOSE 92 02/05/2023   NA 137 02/05/2023   K 4.2 02/05/2023   CL 101 02/05/2023   CO2 31 02/05/2023   BUN 14 02/05/2023   CREATININE 1.04 02/05/2023   GFR 90.14 02/05/2023   CALCIUM 9.7 02/05/2023   PHOS 3.3 09/21/2022   PROT 7.3 02/05/2023   ALBUMIN 4.7 02/05/2023   BILITOT 0.4 02/05/2023   ALKPHOS 92 02/05/2023   AST 22 02/05/2023   ALT 36 02/05/2023   ANIONGAP 6 09/07/2022   Last lipids Lab Results  Component Value Date   CHOL 187 02/05/2023   HDL 36.10 (L) 02/05/2023   LDLDIRECT 106.0 02/05/2023   TRIG 312.0 (H) 02/05/2023   CHOLHDL 5 02/05/2023   Last hemoglobin A1c Lab Results  Component Value Date   HGBA1C 4.7 (L) 08/31/2022   Last thyroid functions Lab Results  Component Value Date   TSH  3.58 02/05/2023   Last vitamin D No results found for: "25OHVITD2", "25OHVITD3", "VD25OH" Last vitamin B12 and Folate No results found for: "VITAMINB12", "FOLATE"    No results found for any visits on 11/05/23. Office Visit on 09/17/2023  Component Date Value   Amphetamines, Urine 09/17/2023 Negative    Cannabinoid Quant, Ur 09/17/2023 Negative    Cocaine (Metab.) 09/17/2023 Negative    OPIATE QUANTITATIVE URINE 09/17/2023 Negative    PCP Quant, Ur 09/17/2023 Negative   Office Visit on 02/05/2023  Component Date Value   WBC 02/05/2023 8.7    RBC 02/05/2023 5.54    Platelets 02/05/2023 258.0    Hemoglobin 02/05/2023 15.8    HCT 02/05/2023 46.1    MCV 02/05/2023 83.2    MCHC 02/05/2023 34.3    RDW 02/05/2023 13.8    Sodium 02/05/2023 137    Potassium 02/05/2023 4.2    Chloride 02/05/2023 101    CO2 02/05/2023 31    Glucose, Bld 02/05/2023 92    BUN 02/05/2023  14    Creatinine, Ser 02/05/2023 1.04    Total Bilirubin 02/05/2023 0.4    Alkaline Phosphatase 02/05/2023 92    AST 02/05/2023 22    ALT 02/05/2023 36    Total Protein 02/05/2023 7.3    Albumin 02/05/2023 4.7    GFR 02/05/2023 90.14    Calcium 02/05/2023 9.7    Cholesterol 02/05/2023 187    Triglycerides 02/05/2023 312.0 (H)    HDL 02/05/2023 36.10 (L)    VLDL 02/05/2023 62.4 (H)    Total CHOL/HDL Ratio 02/05/2023 5    NonHDL 02/05/2023 151.00    Hepatitis C Ab 02/05/2023 NON-REACTIVE    HIV 1&2 Ab, 4th Generati* 02/05/2023 NON-REACTIVE    Direct LDL 02/05/2023 106.0    TSH 02/05/2023 3.58   No image results found. No results found.DG CHEST PORT 1 VIEW Result Date: 09/03/2022 CLINICAL DATA:  Parapneumonic effusion EXAM: PORTABLE CHEST 1 VIEW COMPARISON:  Prior chest x-ray yesterday FINDINGS: Right-sided chest tube remains in place. Slightly decreased right basilar airspace opacity. Persistent streaky opacities in the left lung base. Stable cardiac and mediastinal contours. No pneumothorax. No acute osseous  abnormality. IMPRESSION: 1. Stable position of right-sided chest tube. 2. Improving right basilar airspace opacities. 3. Persistent left basilar atelectasis. Electronically Signed   By: Malachy Moan M.D.   On: 09/03/2022 07:58   DG Chest Port 1 View Result Date: 09/02/2022 CLINICAL DATA:  Pleural effusion. EXAM: PORTABLE CHEST 1 VIEW COMPARISON:  09/01/2022 FINDINGS: There is a right pigtail thoracostomy tube overlying the right mid lung. This is unchanged in position from previous exam. No pneumothorax identified. Unchanged appearance of moderate to large right pleural effusion. Similar appearance of small left pleural effusion with overlying atelectasis. IMPRESSION: 1. Stable position of right pigtail thoracostomy tube. No pneumothorax. 2. Unchanged moderate to large right pleural effusion. Electronically Signed   By: Signa Kell M.D.   On: 09/02/2022 08:15      Assessment & Plan Simple chronic bronchitis (HCC) Chronic Bronchitis They are experiencing a flare of chronic bronchitis, characterized by cough and sore throat, without purulence. Given their history of smoking, there is a heightened risk for severe rehospitalization pneumonia. We discussed the benefits of early antibiotic and steroid treatment. We will prescribe a 5-day course of azithromycin (Z-Pak) and a 7-day course of prednisone. It is essential to ensure the availability of inhalers. Opioid dependence with opioid-induced disorder (HCC) Substance Use Disorder (Opioid Dependence) They are managing well on a monthly Suboxone regimen, reporting its effectiveness and no issues with adherence. They have agreed to upload monthly tablet counts for legal protection and to reduce visit frequency. We will continue Suboxone 8 mg tablets at 1.5 tablets daily, totaling 45 tablets for 30 days. They will upload monthly tablet counts to receive two months of Suboxone at a time. COPD exacerbation (HCC)  High risk medication  use  Smoking  Bipolar 1 disorder (HCC) Bipolar I Disorder Their mood is well-managed on the current regimen, with no recent manic episodes reported. They are aware of the signs of mania, such as excessive spending. They are currently taking Abilify, Wellbutrin, Zoloft, and lamotrigine. We emphasized the importance of continuing mood stabilizers. We will continue Abilify at 5 mg daily, Wellbutrin at 450 mg daily (150 mg and 300 mg tablets), Zoloft at 50 mg daily, and lamotrigine at 50 mg, two tablets daily.     Orders Placed During this Encounter:  No orders of the defined types were placed in this encounter.  Meds ordered  this encounter  Medications   azithromycin (ZITHROMAX) 250 MG tablet    Sig: Take 2 tablets on day 1, then 1 tablet daily on days 2 through 5    Dispense:  6 tablet    Refill:  0   predniSONE (DELTASONE) 20 MG tablet    Sig: Take 2 pills for 3 days, 1 pill for 4 days    Dispense:  10 tablet    Refill:  0   buprenorphine-naloxone (SUBOXONE) 8-2 mg SUBL SL tablet    Sig: Place 1.5 tablets under the tongue daily. Fill when due.    Dispense:  45 tablet    Refill:  0   buPROPion (WELLBUTRIN XL) 300 MG 24 hr tablet    Sig: Take 1 tablet (300 mg total) by mouth daily.    Dispense:  90 tablet    Refill:  3   buPROPion (WELLBUTRIN XL) 150 MG 24 hr tablet    Sig: Take 1 tablet (150 mg total) by mouth daily.    Dispense:  90 tablet    Refill:  3    General Health Maintenance They have successfully quit smoking with the support of Wellbutrin. They are maintaining employment and report positive mood and life satisfaction. We encourage continued smoking abstinence and maintaining current employment and a positive lifestyle.  Follow-up We will schedule a follow-up appointment in one month and consider extending to two-month intervals with regular tablet count uploads.   Medical Decision Making: 2 or more stable chronic illnesses Prescription drug management     This  document was synthesized by artificial intelligence ([employer]) using HIPAA-compliant recording of the clinical interaction;   We discussed the use of AI scribe software for clinical note transcription with the patient, who gave verbal consent to proceed.

## 2023-11-05 NOTE — Assessment & Plan Note (Signed)
Chronic Bronchitis They are experiencing a flare of chronic bronchitis, characterized by cough and sore throat, without purulence. Given their history of smoking, there is a heightened risk for severe rehospitalization pneumonia. We discussed the benefits of early antibiotic and steroid treatment. We will prescribe a 5-day course of azithromycin (Z-Pak) and a 7-day course of prednisone. It is essential to ensure the availability of inhalers.

## 2023-11-05 NOTE — Assessment & Plan Note (Signed)
Bipolar I Disorder Their mood is well-managed on the current regimen, with no recent manic episodes reported. They are aware of the signs of mania, such as excessive spending. They are currently taking Abilify, Wellbutrin, Zoloft, and lamotrigine. We emphasized the importance of continuing mood stabilizers. We will continue Abilify at 5 mg daily, Wellbutrin at 450 mg daily (150 mg and 300 mg tablets), Zoloft at 50 mg daily, and lamotrigine at 50 mg, two tablets daily.

## 2023-11-07 DIAGNOSIS — Z419 Encounter for procedure for purposes other than remedying health state, unspecified: Secondary | ICD-10-CM | POA: Diagnosis not present

## 2023-11-18 ENCOUNTER — Encounter: Payer: Self-pay | Admitting: Internal Medicine

## 2023-12-08 DIAGNOSIS — Z419 Encounter for procedure for purposes other than remedying health state, unspecified: Secondary | ICD-10-CM | POA: Diagnosis not present

## 2023-12-12 ENCOUNTER — Encounter: Payer: Self-pay | Admitting: Internal Medicine

## 2023-12-12 ENCOUNTER — Telehealth: Payer: Medicaid Other | Admitting: Internal Medicine

## 2023-12-12 DIAGNOSIS — F319 Bipolar disorder, unspecified: Secondary | ICD-10-CM | POA: Diagnosis not present

## 2023-12-12 DIAGNOSIS — F1129 Opioid dependence with unspecified opioid-induced disorder: Secondary | ICD-10-CM | POA: Diagnosis not present

## 2023-12-12 DIAGNOSIS — J410 Simple chronic bronchitis: Secondary | ICD-10-CM

## 2023-12-12 DIAGNOSIS — Z79899 Other long term (current) drug therapy: Secondary | ICD-10-CM

## 2023-12-12 MED ORDER — BUPRENORPHINE HCL-NALOXONE HCL 8-2 MG SL SUBL: 1.5000 | SUBLINGUAL_TABLET | Freq: Every day | SUBLINGUAL | 1 refills | Status: DC

## 2023-12-12 NOTE — Progress Notes (Addendum)
 ==============================  [facility]: [phone]   --  Virtual Video Medical Office Visit --  Patient: Carl Le      Age: 43 y.o.       Sex:  male  Date:   12/12/2023 Today's Healthcare Provider: Bernardino KANDICE Cone, MD  ==============================  CHIEF COMPLAINT: Medication Refill   SUBJECTIVE: Background:  This is a 43 y.o. male who has Anxiety disorder; History of smoking; Chronic bronchitis (HCC); Opioid dependence (HCC); High risk medication use; History of incarceration; Bipolar 1 disorder (HCC); and Therapeutic opioid induced constipation on their problem list..   We also reviewed his charted past medical history of:  has a past medical history of Allergy, Anxiety disorder (09/21/2022), Chest pain (09/21/2022), Community acquired pneumonia of right lower lobe of lung (08/30/2022), Current smoker, Depression, Disability examination (09/21/2022), Herpes zoster, High risk medication use (10/26/2022), Kidney calculus, Kidney stone (04/15/2014), Mild emphysema (HCC) (08/30/2022), Normocytic anemia (09/07/2022), Parapneumonic effusion (08/31/2022), Pneumonia (2023), Tobacco dependence (09/21/2022), and Tobacco use (09/02/2022).  History of Present Illness Carl Le is a 43 year old male with opioid use disorder who presents for a monthly follow-up on Suboxone  treatment.  He is on Suboxone  for opioid use disorder, taking 1.5 tablets daily, totaling 45 tablets per month. He states the medication is effective, and he is abstaining from alcohol. He has been compliant with sending required documentation to verify adherence to the treatment plan. He previously encountered an issue with obtaining a two-month supply due to pharmacy stock limitations but is agreeable to a one-month supply with a refill, allowing for bi-monthly visits.  He confirms stability in his mental health, with no issues related to his bipolar disorder  medications, specifically Abilify , and reports no manic episodes. His career is progressing well.  He has quit smoking but continues to use nicotine  gum. He has not tried [employer] but has experimented with other brands.  He has a history of chronic bronchitis and recurrent pneumonia, which is relevant for his consideration of pneumonia vaccinations. He has not received all recommended pneumonia vaccines yet.  He is aware of the importance of dental hygiene due to potential dental issues associated with Suboxone  use.  Discussed - Bipolar disorder - History of cigarette smoking - Chronic bronchitis - Recurrent pneumonia    Problem list overviews that were updated at today's visit:No problems updated.  Today's Verbally Confirmed Medications - Suboxone  - Abilify  Current Outpatient Medications on File Prior to Visit  Medication Sig   ARIPiprazole  (ABILIFY ) 5 MG tablet Take 1 tablet (5 mg total) by mouth daily.   buPROPion  (WELLBUTRIN  XL) 150 MG 24 hr tablet Take 1 tablet (150 mg total) by mouth daily.   buPROPion  (WELLBUTRIN  XL) 300 MG 24 hr tablet Take 1 tablet (300 mg total) by mouth daily.   ciclopirox  (LOPROX ) 0.77 % cream Apply topically 2 (two) times daily.   hydrOXYzine  (ATARAX ) 50 MG tablet Take 1 tablet (50 mg total) by mouth daily as needed (anxiety- will make sleepy).   lamoTRIgine  50 MG TBDP Take 2 tablets (100 mg total) by mouth daily at 6 (six) AM.   nicotine  polacrilex (NICORETTE ) 4 MG gum Take 1 each (4 mg total) by mouth as needed for smoking cessation.   nicotine  polacrilex (NICOTINE  MINI) 4 MG lozenge Take 1 lozenge (4 mg total) by mouth as needed for smoking cessation.   sertraline  (ZOLOFT ) 50 MG tablet Take 1 tablet (50 mg total) by mouth daily. Do not sudden  stop Start at half tablet first 2 weeks   naloxone  (NARCAN ) nasal spray 4 mg/0.1 mL Place 1 spray into the nose once for 1 dose. Use for over sedation for anyone suspected of overdose.   No current  facility-administered medications on file prior to visit.   Medications Discontinued During This Encounter  Medication Reason   predniSONE  (DELTASONE ) 20 MG tablet Patient Preference   buprenorphine -naloxone  (SUBOXONE ) 8-2 mg SUBL SL tablet Reorder      Assessment & Plan  Assessment & Plan    Assessment & Plan Opioid dependence with opioid-induced disorder (HCC) Monthly follow-up for Suboxone  treatment shows adherence to medication and abstinence from opioid use, with no issues reported. Pharmacy stocking larger supplies remains a concern. Emphasized the importance of taking Suboxone  as prescribed to prevent relapse and the risks of selling the medication. Advised brushing teeth 15-20 minutes after taking Suboxone  to prevent dental decay due to associated legal issues. Prescribe Suboxone  1.5 tablets daily, 45 tablets per month with one refill. Schedule follow-up in 60 days. Send monthly photos for monitoring and perform an annual drug screen. Bipolar 1 disorder (HCC) Bipolar Disorder Mental health is well-managed with no manic episodes. Continue current Abilify  regimen and monitor mental health status at each follow-up. High risk medication use PDMP reviewed during this encounter. Jan 2025 random pill count was reviewed. We are in contract - diversion was strongly discouraged over all other misbehaviors. Simple chronic bronchitis (HCC) Chronic Bronchitis with Recurrent Pneumonia Chronic bronchitis with recurrent pneumonia requires avoiding smoking and using nicotine  alternatives. Discussed the importance of pneumonia vaccinations, recommending Capzivax as the best option. Instructed to inform the pharmacy of the condition to facilitate insurance coverage for the vaccine.  General Health Maintenance Ensure all recommended pneumonia vaccinations are received. Advise maintaining a healthy lifestyle and avoiding smoking.  Follow-up Schedule follow-up appointment in 60 days.    Opioid  dependence with opioid-induced disorder (HCC) -     Buprenorphine  HCl-Naloxone  HCl; Place 1.5 tablets under the tongue daily. Fill when due.  Dispense: 45 tablet; Refill: 1  Bipolar 1 disorder (HCC)  High risk medication use -     Buprenorphine  HCl-Naloxone  HCl; Place 1.5 tablets under the tongue daily. Fill when due.  Dispense: 45 tablet; Refill: 1      Today's Healthcare Provider: Bernardino KANDICE Cone, MD located at office: [facility] at [facility] [address] [place] [address] Today's Telemedicine visit was conducted via Video after consent for telemedicine was obtained:  Video connection was never lost All video encounter participant identities and locations confirmed visually and verbally.

## 2023-12-13 NOTE — Assessment & Plan Note (Signed)
 Monthly follow-up for Suboxone  treatment shows adherence to medication and abstinence from opioid use, with no issues reported. Pharmacy stocking larger supplies remains a concern. Emphasized the importance of taking Suboxone  as prescribed to prevent relapse and the risks of selling the medication. Advised brushing teeth 15-20 minutes after taking Suboxone  to prevent dental decay due to associated legal issues. Prescribe Suboxone  1.5 tablets daily, 45 tablets per month with one refill. Schedule follow-up in 60 days. Send monthly photos for monitoring and perform an annual drug screen.

## 2023-12-13 NOTE — Assessment & Plan Note (Signed)
 Chronic Bronchitis with Recurrent Pneumonia Chronic bronchitis with recurrent pneumonia requires avoiding smoking and using nicotine  alternatives. Discussed the importance of pneumonia vaccinations, recommending Capzivax as the best option. Instructed to inform the pharmacy of the condition to facilitate insurance coverage for the vaccine.

## 2023-12-13 NOTE — Assessment & Plan Note (Signed)
 Bipolar Disorder Mental health is well-managed with no manic episodes. Continue current Abilify  regimen and monitor mental health status at each follow-up.

## 2023-12-13 NOTE — Assessment & Plan Note (Signed)
 PDMP reviewed during this encounter. Jan 2025 random pill count was reviewed. We are in contract - diversion was strongly discouraged over all other misbehaviors.

## 2023-12-13 NOTE — Patient Instructions (Signed)
 VISIT SUMMARY:  Today, we reviewed your progress with Suboxone  treatment for opioid use disorder, your mental health status, and discussed your chronic bronchitis and pneumonia prevention. You are doing well with your current medications and have made positive lifestyle changes.  YOUR PLAN:  -OPIOID USE DISORDER: Opioid use disorder is a condition where there is a problematic pattern of opioid use that causes significant impairment or distress. You are doing well with your Suboxone  treatment, taking 1.5 tablets daily. Continue to take your medication as prescribed to prevent relapse. Remember to brush your teeth 15-20 minutes after taking Suboxone  to prevent dental decay. You will receive a prescription for 45 tablets per month with one refill. Please send monthly photos for monitoring and we will perform an annual drug screen.  -BIPOLAR DISORDER: Bipolar disorder is a mental health condition characterized by extreme mood swings. Your mental health is stable with no manic episodes. Continue your current Abilify  regimen and we will monitor your mental health status at each follow-up.  -CHRONIC BRONCHITIS WITH RECURRENT PNEUMONIA: Chronic bronchitis is a long-term inflammation of the airways in the lungs, often leading to recurrent pneumonia. It is important to avoid smoking and use nicotine  alternatives. We discussed the importance of pneumonia vaccinations, and Capzivax is recommended. Inform your pharmacy about your condition to help with insurance coverage for the vaccine.  -GENERAL HEALTH MAINTENANCE: Ensure you receive all recommended pneumonia vaccinations. Maintain a healthy lifestyle and avoid smoking.  INSTRUCTIONS:  Please schedule a follow-up appointment in 60 days. Continue sending monthly photos for monitoring and remember to perform an annual drug screen.   It was a pleasure seeing you today! Your health and satisfaction are our top priorities.  Bernardino Cone, MD  Your  Providers PCP: Cone Bernardino MATSU, MD,  [phone]) Referring Provider: Cone Bernardino MATSU, MD,  [phone])     NEXT STEPS: [x]  Early Intervention: Schedule sooner appointment, call our on-call services, or go to emergency room if there is any significant Increase in pain or discomfort New or worsening symptoms Sudden or severe changes in your health [x]  Flexible Follow-Up: We recommend a No follow-ups on file. for optimal routine care. This allows for progress monitoring and treatment adjustments. [x]  Preventive Care: Schedule your annual preventive care visit! It's typically covered by insurance and helps identify potential health issues early. [x]  Lab & X-ray Appointments: Incomplete tests scheduled today, or call to schedule. X-rays: [facility] (M-F, 8:30am-noon or 1pm-5pm). [x]  Medical Information Release: Sign a release form at front desk to obtain relevant medical information we don't have.  MAKING THE MOST OF OUR FOCUSED 20 MINUTE APPOINTMENTS: [x]   Clearly state your top concerns at the beginning of the visit to focus our discussion [x]   If you anticipate you will need more time, please inform the front desk during scheduling - we can book multiple appointments in the same week. [x]   If you have transportation problems- use our convenient video appointments or ask about transportation support. [x]   We can get down to business faster if you use MyChart to update information before the visit and submit non-urgent questions before your visit. Thank you for taking the time to provide details through MyChart.  Let our nurse know and she can import this information into your encounter documents.  Arrival and Wait Times: [x]   Arriving on time ensures that everyone receives prompt attention. [x]   Early morning (8a) and afternoon (1p) appointments tend to have shortest wait times. [x]   Unfortunately, we cannot delay  appointments for late arrivals or hold slots during  phone calls.  Getting Answers and Following Up [x]   Simple Questions & Concerns: For quick questions or basic follow-up after your visit, reach us  at [phone] or MyChart messaging. [x]   Complex Concerns: If your concern is more complex, scheduling an appointment might be best. Discuss this with the staff to find the most suitable option. [x]   Lab & Imaging Results: We'll contact you directly if results are abnormal or you don't use MyChart. Most normal results will be on MyChart within 2-3 business days, with a review message from Dr. Jesus. Haven't heard back in 2 weeks? Need results sooner? Contact us  at [phone]. [x]   Referrals: Our referral coordinator will manage specialist referrals. The specialist's office should contact you within 2 weeks to schedule an appointment. Call us  if you haven't heard from them after 2 weeks.  Staying Connected [x]   MyChart: Activate your MyChart for the fastest way to access results and message us . See the last page of this paperwork for instructions on how to activate.  Bring to Your Next Appointment [x]   Medications: Please bring all your medication bottles to your next appointment to ensure we have an accurate record of your prescriptions. [x]   Health Diaries: If you're monitoring any health conditions at home, keeping a diary of your readings can be very helpful for discussions at your next appointment.  Billing [x]   X-ray & Lab Orders: These are billed by separate companies. Contact the invoicing company directly for questions or concerns. [x]   Visit Charges: Discuss any billing inquiries with our administrative services team.  Your Satisfaction Matters [x]   Share Your Experience: We strive for your satisfaction! If you have any complaints, or preferably compliments, please let Dr. Jesus know directly or contact our Practice Administrators, Manuelita Rubin or [employer], by asking at the front desk.   Reviewing Your Records [x]    Review this early draft of your clinical encounter notes below and the final encounter summary tomorrow on MyChart after its been completed.  All orders placed so far are visible here: Opioid dependence with opioid-induced disorder (HCC) -     Buprenorphine  HCl-Naloxone  HCl; Place 1.5 tablets under the tongue daily. Fill when due.  Dispense: 45 tablet; Refill: 1  Bipolar 1 disorder (HCC)  High risk medication use -     Buprenorphine  HCl-Naloxone  HCl; Place 1.5 tablets under the tongue daily. Fill when due.  Dispense: 45 tablet; Refill: 1  Simple chronic bronchitis (HCC)

## 2024-01-05 DIAGNOSIS — Z419 Encounter for procedure for purposes other than remedying health state, unspecified: Secondary | ICD-10-CM | POA: Diagnosis not present

## 2024-01-24 ENCOUNTER — Encounter: Payer: Self-pay | Admitting: Internal Medicine

## 2024-02-13 ENCOUNTER — Encounter: Payer: Self-pay | Admitting: Internal Medicine

## 2024-02-13 ENCOUNTER — Telehealth (INDEPENDENT_AMBULATORY_CARE_PROVIDER_SITE_OTHER): Admitting: Internal Medicine

## 2024-02-13 VITALS — Ht 72.0 in | Wt 172.0 lb

## 2024-02-13 DIAGNOSIS — F419 Anxiety disorder, unspecified: Secondary | ICD-10-CM

## 2024-02-13 DIAGNOSIS — F319 Bipolar disorder, unspecified: Secondary | ICD-10-CM

## 2024-02-13 DIAGNOSIS — Z87891 Personal history of nicotine dependence: Secondary | ICD-10-CM

## 2024-02-13 DIAGNOSIS — F411 Generalized anxiety disorder: Secondary | ICD-10-CM | POA: Diagnosis not present

## 2024-02-13 DIAGNOSIS — F1129 Opioid dependence with unspecified opioid-induced disorder: Secondary | ICD-10-CM | POA: Diagnosis not present

## 2024-02-13 DIAGNOSIS — Z789 Other specified health status: Secondary | ICD-10-CM

## 2024-02-13 DIAGNOSIS — Z79899 Other long term (current) drug therapy: Secondary | ICD-10-CM

## 2024-02-13 DIAGNOSIS — Z59819 Housing instability, housed unspecified: Secondary | ICD-10-CM

## 2024-02-13 MED ORDER — HYDROXYZINE HCL 50 MG PO TABS
50.0000 mg | ORAL_TABLET | Freq: Every day | ORAL | 1 refills | Status: DC | PRN
Start: 2024-02-13 — End: 2024-06-23

## 2024-02-13 MED ORDER — SERTRALINE HCL 50 MG PO TABS: 50.0000 mg | ORAL_TABLET | Freq: Every day | ORAL | 3 refills | Status: DC

## 2024-02-13 MED ORDER — BUPRENORPHINE HCL-NALOXONE HCL 8-2 MG SL SUBL: 2.0000 | SUBLINGUAL_TABLET | Freq: Every day | SUBLINGUAL | 1 refills | Status: DC

## 2024-02-13 NOTE — Progress Notes (Signed)
 ====================================  [facility]: [phone]   --  Virtual Video Medical Office Visit --  Patient: Carl Le      Age: 43 y.o.       Sex:  male  Date:   02/13/2024 Today's Healthcare Provider: Lula Olszewski, MD  ====================================    Chief Complaint/Reason For Visit: Medication Refill Stopped all psychiatry medications Chart reviewed: has Anxiety disorder; History of smoking; Chronic bronchitis (HCC); Opioid dependence (HCC); High risk medication use; History of incarceration; Bipolar 1 disorder (HCC); and Therapeutic opioid induced constipation on their problem list..  Chart reviewed:  has a past medical history of Allergy, Anxiety disorder (09/21/2022), Chest pain (09/21/2022), Community acquired pneumonia of right lower lobe of lung (08/30/2022), Current smoker, Depression, Disability examination (09/21/2022), Herpes zoster, High risk medication use (10/26/2022), Kidney calculus, Kidney stone (04/15/2014), Mild emphysema (HCC) (08/30/2022), Normocytic anemia (09/07/2022), Parapneumonic effusion (08/31/2022), Pneumonia (2023), Tobacco dependence (09/21/2022), and Tobacco use (09/02/2022).  History of Present Illness 43 year old male with bipolar disorder and opioid use disorder who presents for a medication refill.  He is seeking a refill for Suboxone, which he has been taking at a dose of 1.5 tablets per day. He is experiencing withdrawal symptoms and believes a higher dose may be necessary. He has not been taking Wellbutrin and Zoloft for about a month, initially due to forgetfulness and then to assess his condition without them. Despite this, he has not had any manic episodes or significant mood disturbances.  Regarding his bipolar disorder, he has not experienced manic episodes but reports anxiety and disrupted sleep. He sleeps six to seven hours per night, with interruptions. He feels anxious and thinks  Zoloft might alleviate this.  He uses nicotine gum and has not smoked cigarettes for an extended period. He does not feel the need for Wellbutrin anymore as it was primarily for smoking cessation, and he does not miss it.  He is living with his sister but is searching for new housing, which is causing stress. He is employed and does not find his job stressful, but the housing situation is concerning. He is aware that his criminal record may impact his housing search.   Medications reviewed Current Outpatient Medications on File Prior to Visit  Medication Sig   ciclopirox (LOPROX) 0.77 % cream Apply topically 2 (two) times daily.   nicotine polacrilex (NICORETTE) 4 MG gum Take 1 each (4 mg total) by mouth as needed for smoking cessation.   nicotine polacrilex (NICOTINE MINI) 4 MG lozenge Take 1 lozenge (4 mg total) by mouth as needed for smoking cessation.   naloxone (NARCAN) nasal spray 4 mg/0.1 mL Place 1 spray into the nose once for 1 dose. Use for over sedation for anyone suspected of overdose.   No current facility-administered medications on file prior to visit.   Medications Discontinued During This Encounter  Medication Reason   buPROPion (WELLBUTRIN XL) 150 MG 24 hr tablet Completed Course   buPROPion (WELLBUTRIN XL) 300 MG 24 hr tablet Completed Course   ARIPiprazole (ABILIFY) 5 MG tablet Completed Course   sertraline (ZOLOFT) 50 MG tablet Completed Course   lamoTRIgine 50 MG TBDP Completed Course   hydrOXYzine (ATARAX) 50 MG tablet Reorder   buprenorphine-naloxone (SUBOXONE) 8-2 mg SUBL SL tablet Reorder       Virtual Physical Exam: Exam Context: Evaluation limited by virtual format; however, patient is clearly visualized, cooperative, and engaged throughout. General Appearance: Well-developed, well-nourished; no acute distress by  limited video assessment. Pulmonary: No respiratory distress apparent; normal work of breathing observed. Neurological: Patient is awake, alert,  and demonstrates no obvious focal neurological deficits or cognitive impairments; sensorium appears unclouded. Psychiatric/Mental Status: Mood is appropriate; demeanor is pleasant, calm, and articulate. Speech is coherent and goal-directed with no evidence of slurred or pressured speech. No abnormal psychomotor activity noted. Substance Misuse Indicators: Pupils appear symmetric and reactive as far as can be assessed via video. No track marks, skin lesions, or other stigmata of substance misuse visible. No signs of intoxication or withdrawal are evident.         No results found for any visits on 02/13/24. Office Visit on 09/17/2023  Component Date Value   Amphetamines, Urine 09/17/2023 Negative    Cannabinoid Quant, Ur 09/17/2023 Negative    Cocaine (Metab.) 09/17/2023 Negative    OPIATE QUANTITATIVE URINE 09/17/2023 Negative    PCP Quant, Ur 09/17/2023 Negative   No image results found. No results found.DG CHEST PORT 1 VIEW Result Date: 09/03/2022 CLINICAL DATA:  Parapneumonic effusion EXAM: PORTABLE CHEST 1 VIEW COMPARISON:  Prior chest x-ray yesterday FINDINGS: Right-sided chest tube remains in place. Slightly decreased right basilar airspace opacity. Persistent streaky opacities in the left lung base. Stable cardiac and mediastinal contours. No pneumothorax. No acute osseous abnormality. IMPRESSION: 1. Stable position of right-sided chest tube. 2. Improving right basilar airspace opacities. 3. Persistent left basilar atelectasis. Electronically Signed   By: Malachy Moan M.D.   On: 09/03/2022 07:58   DG Chest Port 1 View Result Date: 09/02/2022 CLINICAL DATA:  Pleural effusion. EXAM: PORTABLE CHEST 1 VIEW COMPARISON:  09/01/2022 FINDINGS: There is a right pigtail thoracostomy tube overlying the right mid lung. This is unchanged in position from previous exam. No pneumothorax identified. Unchanged appearance of moderate to large right pleural effusion. Similar appearance of small  left pleural effusion with overlying atelectasis. IMPRESSION: 1. Stable position of right pigtail thoracostomy tube. No pneumothorax. 2. Unchanged moderate to large right pleural effusion. Electronically Signed   By: Signa Kell M.D.   On: 09/02/2022 08:15       Assessment & Plan Opioid dependence with opioid-induced disorder [facility]) He is currently on Suboxone and reports feeling withdrawal symptoms at times. Requests for higher doses are common, and the dose is increased to manage withdrawal symptoms effectively. Do not taper until his housing situation stabilizes. Monitor for withdrawal symptoms and adjust as necessary. High risk medication use  Anxiety disorder, unspecified type He reports increased anxiety after stopping medications, attributed to rebound anxiety from stopping Zoloft and baseline anxiety previously controlled by the medication. Restart Sertraline (Zoloft) at half dose to manage anxiety. Prescribe Hydroxyzine (Atarax) 50 mg for anxiety-related sleep issues as needed. Generalized anxiety disorder There is concern about his decision to stop medications, and a follow-up is suggested to monitor his condition and medication adherence. Schedule a follow-up appointment in one month. History of incarceration  Bipolar 1 disorder (HCC) He has not been taking his medications for the past month except for Suboxone and nicotine gum. He reports no manic episodes but experiences fragmented sleep and anxiety. There is concern about the risk of a bipolar manic flare due to medication discontinuation. Restart Sertraline (Zoloft) at half dose to manage anxiety and prevent sleepless nights that could trigger mania. Prescribe Hydroxyzine (Atarax) 50 mg for sleep as needed. Continue Suboxone, increasing to two tablets per day. Encourage maintaining a good sleep schedule. He has the capacity to decide whether to stay off the meds or  restart them at a lower dose. Housing instability He is experiencing  stress due to the need to find new housing, compounded by a criminal record. Seeking legal aid is advised to address potential housing discrimination, and a social work referral is offered for additional support. Refer to social work for assistance with housing and financial resources. Advise contacting Legal Aid for support with housing discrimination issues. History of smoking He has successfully quit smoking and no longer feels the need for Wellbutrin, which was initially prescribed for smoking cessation. Continue using nicotine gum as needed for smoking cessation.      Orders Placed During this Encounter:   Orders Placed This Encounter  Procedures   AMB Referral [facility] Care Management    Referral Priority:   Routine    Referral Type:   Consultation    Referral Reason:   Care Coordination    Number of Visits Requested:   1   Meds ordered this encounter  Medications   buprenorphine-naloxone (SUBOXONE) 8-2 mg SUBL SL tablet    Sig: Place 2 tablets under the tongue daily. Fill when due.    Dispense:  60 tablet    Refill:  1   sertraline (ZOLOFT) 50 MG tablet    Sig: Take 1 tablet (50 mg total) by mouth daily. Do not sudden stop Start at half tablet first 2 weeks    Dispense:  90 tablet    Refill:  3   hydrOXYzine (ATARAX) 50 MG tablet    Sig: Take 1 tablet (50 mg total) by mouth daily as needed (anxiety- will make sleepy).    Dispense:  90 tablet    Refill:  1    Treatment plan discussed and reviewed in detail. Explained medication safety and potential side effects.  Answered all patient questions and confirmed understanding and comfort with the plan. Encouraged patient to contact our office if they have any questions or concerns.  Agreed on patient coming for a sooner office visit if symptoms worsen, persist, or new symptoms develop. Discussed precautions in case of needing to visit the Emergency Department.    ----------------------------------------------------- Attestation:   Today's Healthcare Provider Lula Olszewski, MD was located at office at [facility] [address] [place] [address].  The patient was located at parked car. All video encounter participant identities and locations confirmed visually and verbally.Today's Telemedicine visit was conducted via synchronous Video after consent for telemedicine was obtained:  Video connection was never lost    This document was transcribed and resynthesized, in part, by artificial intelligence ([employer]bridge) using HIPAA-compliant recording of the clinical interaction;   We have discussed the our use of AI scribe software for clinical note transcription with the patient, who has given verbal consent to proceed.

## 2024-02-13 NOTE — Assessment & Plan Note (Signed)
 He has not been taking his medications for the past month except for Suboxone and nicotine gum. He reports no manic episodes but experiences fragmented sleep and anxiety. There is concern about the risk of a bipolar manic flare due to medication discontinuation. Restart Sertraline (Zoloft) at half dose to manage anxiety and prevent sleepless nights that could trigger mania. Prescribe Hydroxyzine (Atarax) 50 mg for sleep as needed. Continue Suboxone, increasing to two tablets per day. Encourage maintaining a good sleep schedule. He has the capacity to decide whether to stay off the meds or restart them at a lower dose.

## 2024-02-13 NOTE — Patient Instructions (Signed)
 VISIT SUMMARY:  Today, we discussed your current medications and symptoms. You mentioned experiencing withdrawal symptoms and anxiety, and you have not been taking some of your prescribed medications for about a month. We reviewed your bipolar disorder, opioid dependence, anxiety, and housing situation. Adjustments to your medication plan were made to better manage your symptoms.  YOUR PLAN:  -MEDICATION MANAGEMENT: You have not been taking your medications except for Suboxone and nicotine gum. To manage your anxiety and prevent sleepless nights that could trigger mania, you should restart Sertraline (Zoloft) at half dose. Hydroxyzine (Atarax) 50 mg is prescribed for sleep as needed. Continue taking Suboxone, increasing to two tablets per day. It's important to maintain a good sleep schedule.  -OPIOID DEPENDENCE: You are currently on Suboxone and experiencing withdrawal symptoms. The dose of Suboxone is increased to manage these symptoms effectively. We will not taper the dose until your housing situation stabilizes. Monitor for withdrawal symptoms and adjust as necessary.  -ANXIETY DISORDER: You have increased anxiety after stopping your medications, likely due to stopping Zoloft. Restart Sertraline (Zoloft) at half dose to manage your anxiety. Hydroxyzine (Atarax) 50 mg is prescribed for anxiety-related sleep issues as needed.  -HOUSING INSTABILITY: You are experiencing stress due to the need to find new housing, which is compounded by your criminal record. Seeking legal aid is advised to address potential housing discrimination, and a social work referral is offered for additional support.  -SMOKING CESSATION: You have successfully quit smoking and no longer feel the need for Wellbutrin, which was initially prescribed for smoking cessation. Continue using nicotine gum as needed for smoking cessation.  INSTRUCTIONS:  Please schedule a follow-up appointment in one month to monitor your condition  and medication adherence. Contact Legal Aid for support with housing discrimination issues and follow up with social work for assistance with housing and financial resources.   It was a pleasure seeing you today! Your health and satisfaction are our top priorities.  Glenetta Hew, MD  Your Providers PCP: Lula Olszewski, MD,  [phone]) Referring Provider: Lula Olszewski, MD,  [phone])     NEXT STEPS: [x]  Early Intervention: Schedule sooner appointment, call our on-call services, or go to emergency room if there is any significant Increase in pain or discomfort New or worsening symptoms Sudden or severe changes in your health [x]  Flexible Follow-Up: We recommend a No follow-ups on file. for optimal routine care. This allows for progress monitoring and treatment adjustments. [x]  Preventive Care: Schedule your annual preventive care visit! It's typically covered by insurance and helps identify potential health issues early. [x]  Lab & X-ray Appointments: Incomplete tests scheduled today, or call to schedule. X-rays: [facility] (M-F, 8:30am-noon or 1pm-5pm). [x]  Medical Information Release: Sign a release form at front desk to obtain relevant medical information we don't have.  MAKING THE MOST OF OUR FOCUSED 20 MINUTE APPOINTMENTS: [x]   Clearly state your top concerns at the beginning of the visit to focus our discussion [x]   If you anticipate you will need more time, please inform the front desk during scheduling - we can book multiple appointments in the same week. [x]   If you have transportation problems- use our convenient video appointments or ask about transportation support. [x]   We can get down to business faster if you use MyChart to update information before the visit and submit non-urgent questions before your visit. Thank you for taking the time to provide details through MyChart.  Let our nurse know and she can import  this information into your  encounter documents.  Arrival and Wait Times: [x]   Arriving on time ensures that everyone receives prompt attention. [x]   Early morning (8a) and afternoon (1p) appointments tend to have shortest wait times. [x]   Unfortunately, we cannot delay appointments for late arrivals or hold slots during phone calls.  Getting Answers and Following Up [x]   Simple Questions & Concerns: For quick questions or basic follow-up after your visit, reach [place] at [phone] or MyChart messaging. [x]   Complex Concerns: If your concern is more complex, scheduling an appointment might be best. Discuss this with the staff to find the most suitable option. [x]   Lab & Imaging Results: We'll contact you directly if results are abnormal or you don't use MyChart. Most normal results will be on MyChart within 2-3 business days, with a review message from Dr. [name]. Haven't heard back in 2 weeks? Need results sooner? Contact us at [phone]. [x]   Referrals: Our referral coordinator will manage specialist referrals. The specialist's office should contact you within 2 weeks to schedule an appointment. Call us if you haven't heard from them after 2 weeks.  Staying Connected [x]   MyChart: Activate your MyChart for the fastest way to access results and message [place]. See the last page of this paperwork for instructions on how to activate.  Bring to Your Next Appointment [x]   Medications: Please bring all your medication bottles to your next appointment to ensure we have an accurate record of your prescriptions. [x]   Health Diaries: If you're monitoring any health conditions at home, keeping a diary of your readings can be very helpful for discussions at your next appointment.  Billing [x]   X-ray & Lab Orders: These are billed by separate companies. Contact the invoicing company directly for questions or concerns. [x]   Visit Charges: Discuss any billing inquiries with our administrative services team.  Your Satisfaction  Matters [x]   Share Your Experience: We strive for your satisfaction! If you have any complaints, or preferably compliments, please let Dr. [name] know directly or contact our Practice Administrators, Edwena Felty or [employer], by asking at the front desk.   Reviewing Your Records [x]   Review this early draft of your clinical encounter notes below and the final encounter summary tomorrow on MyChart after its been completed.  All orders placed so far are visible here: Opioid dependence with opioid-induced disorder (HCC) -     Buprenorphine HCl-Naloxone HCl; Place 2 tablets under the tongue daily. Fill when due.  Dispense: 60 tablet; Refill: 1  High risk medication use -     Buprenorphine HCl-Naloxone HCl; Place 2 tablets under the tongue daily. Fill when due.  Dispense: 60 tablet; Refill: 1 -     hydrOXYzine HCl; Take 1 tablet (50 mg total) by mouth daily as needed (anxiety- will make sleepy).  Dispense: 90 tablet; Refill: 1  Anxiety disorder, unspecified type -     Sertraline HCl; Take 1 tablet (50 mg total) by mouth daily. Do not sudden stop Start at half tablet first 2 weeks  Dispense: 90 tablet; Refill: 3 -     AMB Referral [facility] Care Management  Generalized anxiety disorder -     hydrOXYzine HCl; Take 1 tablet (50 mg total) by mouth daily as needed (anxiety- will make sleepy).  Dispense: 90 tablet; Refill: 1 -     AMB Referral [facility] Care Management  History of incarceration -     AMB Referral [facility] Care Management  Bipolar 1 disorder (HCC)  Housing instability  History of smoking

## 2024-02-13 NOTE — Assessment & Plan Note (Signed)
 There is concern about his decision to stop medications, and a follow-up is suggested to monitor his condition and medication adherence. Schedule a follow-up appointment in one month.

## 2024-02-13 NOTE — Assessment & Plan Note (Signed)
 He reports increased anxiety after stopping medications, attributed to rebound anxiety from stopping Zoloft and baseline anxiety previously controlled by the medication. Restart Sertraline (Zoloft) at half dose to manage anxiety. Prescribe Hydroxyzine (Atarax) 50 mg for anxiety-related sleep issues as needed.

## 2024-02-13 NOTE — Assessment & Plan Note (Signed)
 He has successfully quit smoking and no longer feels the need for Wellbutrin, which was initially prescribed for smoking cessation. Continue using nicotine gum as needed for smoking cessation.

## 2024-02-13 NOTE — Assessment & Plan Note (Signed)
 He is currently on Suboxone and reports feeling withdrawal symptoms at times. Requests for higher doses are common, and the dose is increased to manage withdrawal symptoms effectively. Do not taper until his housing situation stabilizes. Monitor for withdrawal symptoms and adjust as necessary.

## 2024-02-15 ENCOUNTER — Telehealth: Payer: Self-pay | Admitting: *Deleted

## 2024-02-15 NOTE — Progress Notes (Signed)
 Complex Care Management Note Care Guide Note  02/15/2024 Name: Carl Le MRN: [id number] DOB: [birth year]   Complex Care Management Outreach Attempts: An unsuccessful telephone outreach was attempted today to offer the patient information about available complex care management services.  Follow Up Plan:  Additional outreach attempts will be made to offer the patient complex care management information and services.   Encounter Outcome:  No Answer  Burman Nieves, CMA [place]  [facility], [facility] Guide Direct Dial: [phone]  Fax: [phone] Website: [email or web address]

## 2024-02-16 DIAGNOSIS — Z419 Encounter for procedure for purposes other than remedying health state, unspecified: Secondary | ICD-10-CM | POA: Diagnosis not present

## 2024-02-19 ENCOUNTER — Other Ambulatory Visit: Payer: Self-pay | Admitting: Licensed Clinical Social Worker

## 2024-02-19 ENCOUNTER — Encounter: Payer: Self-pay | Admitting: Licensed Clinical Social Worker

## 2024-02-25 ENCOUNTER — Telehealth: Payer: Self-pay | Admitting: *Deleted

## 2024-02-25 NOTE — Progress Notes (Signed)
 Complex Care Management Care Guide Note  02/25/2024 Name: Carl Le MRN: [id number] DOB: 24-Oct-1981  Carl Le is a 43 y.o. year old male who is a primary care patient of Anthon Kins, MD and is actively engaged with the care management team. I reached out to Melodee Spruce by phone today to assist with re-scheduling  with the Licensed Clinical Child psychotherapist.  Follow up plan: pt declined to reschedule at this time   Kandis Ormond, CMA [facility]  [facility], [facility] Guide Direct Dial: [phone]  Fax: [phone] Website: [email or web address]

## 2024-03-06 ENCOUNTER — Encounter: Payer: Self-pay | Admitting: Internal Medicine

## 2024-03-17 DIAGNOSIS — Z419 Encounter for procedure for purposes other than remedying health state, unspecified: Secondary | ICD-10-CM | POA: Diagnosis not present

## 2024-04-16 ENCOUNTER — Ambulatory Visit: Admitting: Internal Medicine

## 2024-04-16 ENCOUNTER — Telehealth (INDEPENDENT_AMBULATORY_CARE_PROVIDER_SITE_OTHER): Admitting: Internal Medicine

## 2024-04-16 ENCOUNTER — Encounter: Payer: Self-pay | Admitting: Internal Medicine

## 2024-04-16 DIAGNOSIS — E569 Vitamin deficiency, unspecified: Secondary | ICD-10-CM

## 2024-04-16 DIAGNOSIS — F319 Bipolar disorder, unspecified: Secondary | ICD-10-CM

## 2024-04-16 DIAGNOSIS — Z79899 Other long term (current) drug therapy: Secondary | ICD-10-CM

## 2024-04-16 DIAGNOSIS — E440 Moderate protein-calorie malnutrition: Secondary | ICD-10-CM

## 2024-04-16 DIAGNOSIS — Z59819 Housing instability, housed unspecified: Secondary | ICD-10-CM

## 2024-04-16 DIAGNOSIS — F1129 Opioid dependence with unspecified opioid-induced disorder: Secondary | ICD-10-CM

## 2024-04-16 DIAGNOSIS — K089 Disorder of teeth and supporting structures, unspecified: Secondary | ICD-10-CM

## 2024-04-16 DIAGNOSIS — E559 Vitamin D deficiency, unspecified: Secondary | ICD-10-CM | POA: Diagnosis not present

## 2024-04-16 MED ORDER — BOOST HIGH PROTEIN PO LIQD: 1.0000 | Freq: Three times a day (TID) | ORAL | 0 refills | Status: DC

## 2024-04-16 MED ORDER — BUPRENORPHINE HCL-NALOXONE HCL 8-2 MG SL SUBL: 2.0000 | SUBLINGUAL_TABLET | Freq: Every day | SUBLINGUAL | 1 refills | Status: DC

## 2024-04-16 NOTE — Progress Notes (Signed)
 ====================================   [facility]: [id number]   --  Virtual Video Medical Office Visit --  Patient: Carl Le      Age: 43 y.o.       Sex:  male  Date:   04/16/2024 Today's Healthcare Provider: Anthon Kins, MD  ====================================    Chief Complaint/Reason For Visit: Medication Refill  History of Present Illness a 43 year old male who presents with concerns about malnutrition and weight loss.  He has significant dental issues that have led to difficulties in eating and subsequent weight loss. Over the past six months, he has lost approximately 20 pounds due to his inability to eat properly. He is currently awaiting Medicaid approval for full dentures.  Due to his dental problems, his diet primarily consists of soft foods such as pudding, grits, oatmeal, and junk food. He acknowledges that this diet is not nutritionally adequate and has contributed to his weight loss and feelings of weakness. He is considering starting nutritional supplements like Ensure or Boost to address his nutritional deficiencies. He is also exploring dietary options that include cereals fortified with vitamins and minerals, as well as eggs and canned tuna for protein intake.  No chest pain, shortness of breath, return to smoking, or failure to exercise. He also denies any symptoms such as coughing up blood.   Medications reviewed Current Outpatient Medications on File Prior to Visit  Medication Sig   ciclopirox  (LOPROX ) 0.77 % cream Apply topically 2 (two) times daily.   hydrOXYzine  (ATARAX ) 50 MG tablet Take 1 tablet (50 mg total) by mouth daily as needed (anxiety- will make sleepy).   nicotine  polacrilex (NICORETTE ) 4 MG gum Take 1 each (4 mg total) by mouth as needed for smoking cessation.   nicotine  polacrilex (NICOTINE  MINI) 4 MG lozenge Take 1 lozenge (4 mg total) by mouth as needed for smoking cessation.   sertraline   (ZOLOFT ) 50 MG tablet Take 1 tablet (50 mg total) by mouth daily. Do not sudden stop Start at half tablet first 2 weeks   naloxone  (NARCAN ) nasal spray 4 mg/0.1 mL Place 1 spray into the nose once for 1 dose. Use for over sedation for anyone suspected of overdose.   No current facility-administered medications on file prior to visit.   Medications Discontinued During This Encounter  Medication Reason   buprenorphine -naloxone  (SUBOXONE ) 8-2 mg SUBL SL tablet Reorder        Virtual Physical Exam Physical Exam  Exam Context: Evaluation limited by virtual format; however, patient is clearly visualized, cooperative, and engaged throughout. General Appearance: Well-developed, well-nourished; no acute distress by limited video assessment. Pulmonary: No respiratory distress apparent; normal work of breathing observed. Neurological: Patient is awake, alert, and demonstrates no obvious focal neurological deficits or cognitive impairments; sensorium appears unclouded. Psychiatric/Mental Status: Mood is appropriate; demeanor is pleasant, calm, and articulate. Speech is coherent and goal-directed with no evidence of slurred or pressured speech. No abnormal psychomotor activity noted. Substance Misuse Indicators: Pupils appear symmetric and reactive as far as can be assessed via video. No track marks, skin lesions, or other stigmata of substance misuse visible. No signs of intoxication or withdrawal are evident.   Assessment & Plan Opioid dependence with opioid-induced disorder [facility]) He is in recovery and adheres to Suboxone  therapy. Potential Medicaid loss may affect treatment affordability. Emphasized adherence to Suboxone  to reduce appointment frequency and costs. Discussed extending the prescription to three months with weekly photo evidence of adherence to mitigate costs if  Medicaid is lost. Prescribe Suboxone  for two months, extendable to three months with weekly adherence photo evidence. Send  Suboxone  prescription to [facility] in [place], [address]. Bipolar 1 disorder (HCC) Doing well based on interaction. High risk medication use Advised patient- if he sends weekly pill counts, would be willing to go to 3 months suboxone  per visit, so that he can save more money. PDMP reviewed during this encounter. Poor dentition Patient has plan for dental Vitamin D deficiency Encouraged vitamin use Vitamin deficiency Encouraged vitamin use Malnutrition of moderate degree (HCC) Significant dental issues cause eating difficulties and a 20-pound weight loss over six months. Awaiting Medicaid approval for dentures. Suspected multiple vitamin deficiencies due to a limited diet. Discussed dietary changes and supplements. Insurance may cover nutritional supplements if malnutrition is documented, though unlikely. Recommend a diet including eggs, cereal (e.g., Cheerios, Honey Bunches of Oats), and Boost if affordable. Suggest iron, calcium, vitamin D, B12, folate, vitamin C, and omega-3 fatty acid supplements. Document moderate malnutrition to attempt insurance coverage for Boost. Advise contacting Medicaid to expedite dental approval. Housing instability Recently moved in with his girlfriend, achieving stable housing, positively impacting recovery.  Denies chest pain, dyspnea, smoking relapse, or inactivity. Encouraged to maintain a healthy lifestyle to support recovery and well-being. Discussed physical activity importance and sedentary lifestyle risks. Encourage remaining smoke-free. Advise engaging in physical activity, such as using resistance bands, to maintain fitness. Emphasized follow-up importance, especially if financial or health setbacks occur due to potential Medicaid loss. Discussed Medicaid loss risk and its impact on recovery and care access. Advise returning sooner than two months if financial or health issues arise. Instruct to send a MyChart message if additional resources are needed.        Orders Placed During this Encounter:  No orders of the defined types were placed in this encounter.  Meds ordered this encounter  Medications   feeding supplement (BOOST HIGH PROTEIN) LIQD    Sig: Take 237 mLs by mouth 3 (three) times daily between meals.    Dispense:  237 mL    Refill:  0   buprenorphine -naloxone  (SUBOXONE ) 8-2 mg SUBL SL tablet    Sig: Place 2 tablets under the tongue daily. Fill when due.    Dispense:  60 tablet    Refill:  1    Treatment plan discussed and reviewed in detail. Explained medication safety and potential side effects.  Answered all patient questions and confirmed understanding and comfort with the plan. Encouraged patient to contact our office if they have any questions or concerns.  Agreed on patient coming for a sooner office visit if symptoms worsen, persist, or new symptoms develop. Discussed precautions in case of needing to visit the Emergency Department.    ----------------------------------------------------- Attestation:  Today's Healthcare Provider Anthon Kins, MD was located at office at [facility] [address], [place] [address].  The patient was located at home. All video encounter participant identities and locations confirmed visually and verbally.Today's Telemedicine visit was conducted via synchronous Video after consent for telemedicine was obtained:  Video connection was never lost    This document was transcribed and resynthesized, in part, by artificial intelligence ([employer]bridge) using HIPAA-compliant recording of the clinical interaction;   We have discussed the our use of AI scribe software for clinical note transcription with the patient, who has given verbal consent to proceed.

## 2024-04-16 NOTE — Patient Instructions (Addendum)
 For patients experiencing weight loss due to poor dentition, it's crucial to address both nutritional deficiencies and the challenges of impaired mastication. The following supplements and dietary strategies are recommended:en.wikipedia.org  ?? Oral Nutritional Supplements (ONS) High-calorie, high-protein liquid supplements can aid in meeting nutritional needs when chewing is difficult. Brands like Ensure, Boost, and Glucerna offer options that provide essential nutrients and can be consumed cold, diluted, or incorporated into foods to enhance palatability .badgut.org  ?? Essential Nutrients to Supplement 1. Protein Adequate protein intake is vital for tissue repair and immune function. If oral intake is insufficient, consider protein isolate powders mixed with liquids to meet the recommended 60-80 g/day .[email or web address] 2. Calcium and Vitamin D These nutrients are essential for bone and dental health. Calcium citrate is preferred over calcium carbonate for better absorption, especially in individuals with low stomach acid. Vitamin D enhances calcium absorption and supports gum health .[email or web address].[email or web address] 3. Iron Iron deficiency can lead to anemia and oral manifestations like glossitis. Supplementation with iron salts, such as ferrous sulfate, along with vitamin C to enhance absorption, is recommended .[email or web address] 4. Vitamin B12 and Folate Deficiencies can cause oral symptoms like angular cheilitis and glossitis. Supplementation may be necessary, especially if dietary intake is inadequate or absorption is impaired .pocketdentistry.comen.[email or web address] 5. Vitamin C Crucial for gum health and collagen formation. Deficiency can lead to gum bleeding and tooth mobility. Ensure adequate intake through supplements or vitamin C-rich foods .blog.[email or web address] 6. Omega-3  Fatty Acids These have anti-inflammatory properties beneficial for gum health. Incorporate sources like fatty fish or consider supplementation if dietary intake is low .dentistry.[email or web address]  ?? Dietary and Lifestyle Tips Soft, Nutrient-Dense Foods: Opt for foods that are easy to chew and rich in essential nutrients, such as smoothies, soups, and pureed dishes. Hydration: Maintain adequate fluid intake to support overall health and prevent dehydration. Regular Monitoring: Assess nutritional status regularly and adjust supplementation as needed. Professional Consultation: Work with a Data processing manager or nutritionist to tailor dietary plans to individual needs.  Addressing nutritional deficiencies through appropriate supplementation and dietary modifications can significantly improve health outcomes for patients with poor dentition and associated weight loss.  Sources You said: what cereals have enough vitamins for this guy ChatGPT said: For a patient experiencing weight loss due to poor dentition, selecting cereals that are both nutrient-dense and easy to consume is essential. Opting for fortified cereals can help ensure adequate intake of essential vitamins and minerals, especially when chewing is challenging. Here are some suitable options:  ?? Fortified Cereals for Nutritional Support Total Whole Grain Cereal Nutritional Highlights: Provides 100% of the Daily Value (DV) for 12 vitamins and minerals, including calcium, iron, and several B vitamins. Benefits: High in iron, which is crucial for preventing anemia, and offers a balanced nutrient profile to support overall health. med-health.[email or web address] Grape-Nuts Original Nutritional Highlights: Contains 90% DV of iron per serving, along with added B vitamins and zinc. Benefits: High in fiber and protein, supporting digestive health and muscle maintenance. med-health.netsoov.ai Kellogg's All-Bran Flakes Nutritional Highlights: Fortified with iron,  calcium, and B vitamins. Benefits: High in fiber, promoting digestive health. However, the texture may be challenging for those with severe dentition issues. welzo.com+33med-[email or web address]+[email or web address] Honey Bunches of Oats Nutritional Highlights: Fortified with iron, folic acid, and several B vitamins. Benefits: Offers a more palatable option with a variety of textures, including flakes and oat clusters. en.wikipedia.[email or web address] Cheerios (Original or Multigrain) Nutritional Highlights: Fortified with iron, zinc, and B vitamins. Benefits: Made from whole grain oats, providing a good source of fiber. The small, round  shape may be easier to consume for those with dental issues. [email or web address]://[email or web address] Kashi GO Cold Breakfast Cereal Nutritional Highlights: Contains 9 grams of protein per serving, along with added vitamins and minerals. Benefits: Emphasizes plant-based protein and whole grains, supporting muscle health and overall nutrition. breakfasthustle.com Bob[employer] Paleo-Style Muesli Nutritional Highlights: Provides iron and calcium, along with fiber and protein. Benefits: Grain-free and gluten-free, suitable for those with dietary restrictions. The soft texture may be easier to chew. [email or web address]+[email or web address]+[email or web address]+[email or web address]  ?? Tips for Consumption Pair with Nutrient-Rich Liquids: Combine cereals with milk or fortified plant-based alternatives to enhance calcium and vitamin D intake. Add Soft Fruits: Incorporate mashed bananas or berries to increase fiber and vitamin content. Avoid Excessive Sugar: Choose cereals with minimal added sugars to support overall health.  Selecting cereals that are both fortified and easy to consume can significantly aid in meeting nutritional needs for patients with poor dentition. It's essential to consider individual preferences and dietary restrictions when recommending specific products.    Sources You  said: needs a cheap source of pureed proteins ChatGPT said: For a patient experiencing weight loss due to poor dentition, incorporating affordable, pureed protein sources into their diet is essential. These options are not only budget-friendly but also easy to prepare and consume, ensuring adequate nutrition.  ?? Affordable Pureed Protein Sources Canned Meats (Chicken, Tuna, [place]) Benefits: Canned meats are cost-effective and rich in protein. They can be easily blended with gravies or sauces to achieve a smooth consistency suitable for pureed diets. For instance, canned salmon can be combined with a creamy white sauce to create a nutritious pureed meal .[email or web address] Cottage Cheese Benefits: Cottage cheese is high in protein and can be blended to a smooth texture. It serves as a versatile base for various pureed dishes and can be combined with fruits or vegetables for added nutrients . [place] Yogurt Benefits: [place] yogurt is creamy, rich in protein, and can be easily pureed. It can be flavored with pureed fruits or honey to enhance taste and provide additional calories .[email or web address] Eggs Benefits: Eggs are an excellent source of protein and can be prepared in various forms such as scrambled or poached. They can be blended to a smooth consistency and incorporated into pureed meals .ageingup.com Silken Tofu Benefits: Silken tofu has a custard-like texture, making it ideal for pureed diets. It is a plant-based protein source that can be blended into smoothies or soups for added nutrition .[email or web address]://[email or web address]-[email or web address]  ?? Pureed Meal Ideas Pureed Chicken with Gravy: Blend canned chicken with low-sodium gravy until smooth. Serve over mashed potatoes or soft bread for a comforting meal.[email or web address] [employer] and Fruit Blend: Puree cottage cheese with soft fruits like bananas or peaches for a protein-rich  [email or web address]://[email or web address] Egg and Vegetable Puree: Scramble eggs with soft-cooked vegetables, then blend until smooth. This provides a balanced meal with protein and nutrients. Tofu and Vegetable Soup: Blend silken tofu with cooked vegetables and broth to create a smooth, protein-packed soup.  ?? Tips for Cost-Effective Meal Preparation Bulk Purchasing: Buy canned meats and tofu in bulk to reduce costs. Homemade Blending: Prepare pureed meals at home using a blender or food processor to save money compared to pre-packaged options. Utilize Leftovers: Repurpose leftover meats and vegetables by blending them into pureed meals, minimizing food waste.    By incorporating these affordable, pureed protein sources into your patient's diet, you can help ensure they receive the necessary nutrition to support their health and recovery.

## 2024-04-17 DIAGNOSIS — Z419 Encounter for procedure for purposes other than remedying health state, unspecified: Secondary | ICD-10-CM | POA: Diagnosis not present

## 2024-04-19 ENCOUNTER — Encounter: Payer: Self-pay | Admitting: Internal Medicine

## 2024-04-19 DIAGNOSIS — E440 Moderate protein-calorie malnutrition: Secondary | ICD-10-CM | POA: Insufficient documentation

## 2024-04-19 NOTE — Assessment & Plan Note (Signed)
 Significant dental issues cause eating difficulties and a 20-pound weight loss over six months. Awaiting Medicaid approval for dentures. Suspected multiple vitamin deficiencies due to a limited diet. Discussed dietary changes and supplements. Insurance may cover nutritional supplements if malnutrition is documented, though unlikely. Recommend a diet including eggs, cereal (e.g., Cheerios, Honey Bunches of Oats), and Boost if affordable. Suggest iron, calcium, vitamin D, B12, folate, vitamin C, and omega-3 fatty acid supplements. Document moderate malnutrition to attempt insurance coverage for Boost. Advise contacting Medicaid to expedite dental approval.

## 2024-04-19 NOTE — Assessment & Plan Note (Signed)
 He is in recovery and adheres to Suboxone  therapy. Potential Medicaid loss may affect treatment affordability. Emphasized adherence to Suboxone  to reduce appointment frequency and costs. Discussed extending the prescription to three months with weekly photo evidence of adherence to mitigate costs if Medicaid is lost. Prescribe Suboxone  for two months, extendable to three months with weekly adherence photo evidence. Send Suboxone  prescription to [facility] in [place], [address]

## 2024-04-19 NOTE — Assessment & Plan Note (Signed)
 Patient has plan for dental

## 2024-04-19 NOTE — Assessment & Plan Note (Signed)
 Advised patient- if he sends weekly pill counts, would be willing to go to 3 months suboxone  per visit, so that he can save more money. PDMP reviewed during this encounter.

## 2024-04-19 NOTE — Assessment & Plan Note (Signed)
 Encouraged vitamin use

## 2024-04-19 NOTE — Assessment & Plan Note (Signed)
 Doing well based on interaction.

## 2024-04-29 ENCOUNTER — Encounter: Payer: Self-pay | Admitting: Internal Medicine

## 2024-05-17 DIAGNOSIS — Z419 Encounter for procedure for purposes other than remedying health state, unspecified: Secondary | ICD-10-CM | POA: Diagnosis not present

## 2024-06-08 ENCOUNTER — Encounter: Payer: Self-pay | Admitting: Internal Medicine

## 2024-06-17 DIAGNOSIS — Z419 Encounter for procedure for purposes other than remedying health state, unspecified: Secondary | ICD-10-CM | POA: Diagnosis not present

## 2024-06-23 ENCOUNTER — Encounter: Payer: Self-pay | Admitting: Internal Medicine

## 2024-06-23 ENCOUNTER — Telehealth (INDEPENDENT_AMBULATORY_CARE_PROVIDER_SITE_OTHER): Admitting: Internal Medicine

## 2024-06-23 DIAGNOSIS — F319 Bipolar disorder, unspecified: Secondary | ICD-10-CM | POA: Diagnosis not present

## 2024-06-23 DIAGNOSIS — Z87891 Personal history of nicotine dependence: Secondary | ICD-10-CM | POA: Diagnosis not present

## 2024-06-23 DIAGNOSIS — Z79899 Other long term (current) drug therapy: Secondary | ICD-10-CM

## 2024-06-23 DIAGNOSIS — F1129 Opioid dependence with unspecified opioid-induced disorder: Secondary | ICD-10-CM

## 2024-06-23 MED ORDER — BUPRENORPHINE HCL-NALOXONE HCL 8-2 MG SL SUBL: 2.0000 | SUBLINGUAL_TABLET | Freq: Every day | SUBLINGUAL | 1 refills | Status: DC

## 2024-06-23 NOTE — Assessment & Plan Note (Signed)
 Opioid dependence in remission on medication-assisted therapy   Opioid dependence remains in remission with Suboxone . He reports sobriety and adherence to medication. Continue Suboxone  as prescribed. Send prescription to [facility]. Schedule a 60-day follow-up, preferably via video. Continue(s) with pillcounts for diversion prevention PDMP reviewed during this encounter.

## 2024-06-23 NOTE — Progress Notes (Signed)
 ==============================  [facility]: [phone]   -- Medical Office Visit --  Patient: Carl Le      Age: 43 y.o.       Sex:  male  Date:   06/23/2024 Today's Healthcare Provider: Bernardino KANDICE Cone, MD  ==============================   Chief Complaint: Medication Refill  Discussed the use of AI scribe software for clinical note transcription with the patient, who gave verbal consent to proceed.  History of Present Illness Carl Le is a 43 year old male with chronic bronchitis and bipolar disorder who presents for a routine follow-up.  He has a history of chronic bronchitis, previously exacerbated by smoking. He has not experienced any flares of chronic bronchitis or pneumonia in over a year since quitting smoking. He has not yet received the pneumonia vaccine. Will discuss with pharmacy   He has a history of bipolar disorder and has not experienced any recent flares. He was warned that poor sleep tends to flare his bipolar disorder, but currently he is getting good quality sleep.  He is currently taking Suboxone  and adheres to his medication regimen. He confirms that he is staying sober and maintaining employment.  He is sending pill count uploads regularly.    His overall life situation, including relationships and home life, is stable and positive. He is currently employed and describes his job as going well.   Background Reviewed: Problem List: has History of smoking; Chronic bronchitis (HCC); Opioid dependence (HCC); High risk medication use; History of incarceration; Bipolar 1 disorder (HCC); Therapeutic opioid induced constipation; Vitamin D deficiency; Poor dentition; and Malnutrition of moderate degree (HCC) on their problem list. Past Medical History:  has a past medical history of Allergy, Anxiety disorder (09/21/2022), Anxiety disorder (09/21/2022), Chest pain (09/21/2022), Community acquired pneumonia of right  lower lobe of lung (08/30/2022), Current smoker, Depression, Disability examination (09/21/2022), Herpes zoster, High risk medication use (10/26/2022), Kidney calculus, Kidney stone (04/15/2014), Mild emphysema (HCC) (08/30/2022), Normocytic anemia (09/07/2022), Parapneumonic effusion (08/31/2022), Pneumonia (2023), Tobacco dependence (09/21/2022), and Tobacco use (09/02/2022). Past Surgical History:   has no past surgical history on file. Social History:   reports that he has been smoking cigarettes. He does not have any smokeless tobacco history on file. He reports that he does not currently use alcohol. He reports that he does not currently use drugs after having used the following drugs: Heroin, Cocaine, Marijuana, and Amphetamines. Family History:  family history includes Alcohol abuse in his maternal grandfather and maternal grandmother; COPD in his mother; Cancer in his maternal grandmother, mother, and paternal grandmother; Diabetes in his mother; Early death in his father and mother; Heart attack in his brother, father, maternal grandfather, and paternal grandfather; Hypertension in his mother. Allergies:  has no known allergies.   Medication Reconciliation: Current Outpatient Medications on File Prior to Visit  Medication Sig   naloxone  (NARCAN ) nasal spray 4 mg/0.1 mL Place 1 spray into the nose once for 1 dose. Use for over sedation for anyone suspected of overdose.   No current facility-administered medications on file prior to visit.   Medications Discontinued During This Encounter  Medication Reason   nicotine  polacrilex (NICORETTE ) 4 MG gum    nicotine  polacrilex (NICOTINE  MINI) 4 MG lozenge    ciclopirox  (LOPROX ) 0.77 % cream    sertraline  (ZOLOFT ) 50 MG tablet    hydrOXYzine  (ATARAX ) 50 MG tablet    feeding supplement (BOOST HIGH PROTEIN) LIQD    buprenorphine -naloxone  (SUBOXONE ) 8-2 mg  SUBL SL tablet Reorder     Physical Exam:    02/13/2024    2:34 PM 09/17/2023    7:55  AM 08/21/2023    9:25 AM  Vitals with BMI  Height 6' 0 6' 0   Weight 172 lbs 172 lbs 10 oz 174 lbs  BMI 23.32 23.4   Systolic  133   Diastolic  81   Pulse  75   Vital signs reviewed.  Nursing notes reviewed. Weight trend reviewed. Physical Activity: Insufficiently Active (02/05/2023)   Exercise Vital Sign    Days of Exercise per Week: 2 days    Minutes of Exercise per Session: 20 min   General Appearance:  No acute distress appreciable.   Well-groomed, healthy-appearing male.  Well proportioned with no abnormal fat distribution.  Good muscle tone. Pulmonary:  Normal work of breathing at rest, no respiratory distress apparent.    Musculoskeletal: All extremities are intact.  Neurological:  Awake, alert, oriented, and engaged.  No obvious focal neurological deficits or cognitive impairments.  Sensorium seems unclouded.   Speech is clear and coherent with logical content. Psychiatric:  Appropriate mood, pleasant and cooperative demeanor, thoughtful and engaged during the exam   Verbalized to patient: Physical Exam    Results:    02/13/2024    2:33 PM 12/12/2023    5:25 PM 11/05/2023    3:01 PM 09/17/2023    8:02 AM  PHQ 2/9 Scores  PHQ - 2 Score 0 0 0 0  PHQ- 9 Score    0    Verbalized to patient: Results     No results found for any visits on 06/23/24. Office Visit on 09/17/2023  Component Date Value Ref Range Status   Amphetamines, Urine 09/17/2023 Negative  Cutoff=1000 ng/mL Final   Cannabinoid Quant, Ur 09/17/2023 Negative  Cutoff=50 ng/mL Final   Cocaine (Metab.) 09/17/2023 Negative  Cutoff=300 ng/mL Final   OPIATE QUANTITATIVE URINE 09/17/2023 Negative  Cutoff=2000 ng/mL Final   PCP Quant, Ur 09/17/2023 Negative  Cutoff=25 ng/mL Final  Office Visit on 02/05/2023  Component Date Value Ref Range Status   WBC 02/05/2023 8.7  4.0 - 10.5 K/uL Final   RBC 02/05/2023 5.54  4.22 - 5.81 Mil/uL Final   Platelets 02/05/2023 258.0  150.0 - 400.0 K/uL Final   Hemoglobin  02/05/2023 15.8  13.0 - 17.0 g/dL Final   HCT 95/98/7975 46.1  39.0 - 52.0 % Final   MCV 02/05/2023 83.2  78.0 - 100.0 fl Final   MCHC 02/05/2023 34.3  30.0 - 36.0 g/dL Final   RDW 95/98/7975 13.8  11.5 - 15.5 % Final   Sodium 02/05/2023 137  135 - 145 mEq/L Final   Potassium 02/05/2023 4.2  3.5 - 5.1 mEq/L Final   Chloride 02/05/2023 101  96 - 112 mEq/L Final   CO2 02/05/2023 31  19 - 32 mEq/L Final   Glucose, Bld 02/05/2023 92  70 - 99 mg/dL Final   BUN 95/98/7975 14  6 - 23 mg/dL Final   Creatinine, Ser 02/05/2023 1.04  0.40 - 1.50 mg/dL Final   Total Bilirubin 02/05/2023 0.4  0.2 - 1.2 mg/dL Final   Alkaline Phosphatase 02/05/2023 92  39 - 117 U/L Final   AST 02/05/2023 22  0 - 37 U/L Final   ALT 02/05/2023 36  0 - 53 U/L Final   Total Protein 02/05/2023 7.3  6.0 - 8.3 g/dL Final   Albumin 95/98/7975 4.7  3.5 - 5.2 g/dL Final   GFR 95/98/7975  90.14  >60.00 mL/min Final   Calcium 02/05/2023 9.7  8.4 - 10.5 mg/dL Final   Cholesterol 95/98/7975 187  0 - 200 mg/dL Final   Triglycerides 95/98/7975 312.0 (H)  0.0 - 149.0 mg/dL Final   HDL 95/98/7975 36.10 (L)  >60.99 mg/dL Final   VLDL 95/98/7975 62.4 (H)  0.0 - 40.0 mg/dL Final   Total CHOL/HDL Ratio 02/05/2023 5   Final   NonHDL 02/05/2023 151.00   Final   Hepatitis C Ab 02/05/2023 NON-REACTIVE  NON-REACTIVE Final   HIV 1&2 Ab, 4th Generation 02/05/2023 NON-REACTIVE  NON-REACTIVE Final   Direct LDL 02/05/2023 106.0  mg/dL Final   TSH 95/98/7975 3.58  0.35 - 5.50 uIU/mL Final  Office Visit on 10/18/2022  Component Date Value Ref Range Status   Amphetamines, Urine 10/18/2022 Negative  Cutoff=1000 ng/mL Final   Cannabinoid Quant, Ur 10/18/2022 Negative  Cutoff=50 ng/mL Final   Cocaine (Metab.) 10/18/2022 Negative  Cutoff=300 ng/mL Final   OPIATE QUANTITATIVE URINE 10/18/2022 Negative  Cutoff=2000 ng/mL Final   PCP Quant, Ur 10/18/2022 Negative  Cutoff=25 ng/mL Final  Office Visit on 09/21/2022  Component Date Value Ref Range Status    TSH 09/21/2022 1.94  0.35 - 5.50 uIU/mL Final   Magnesium  09/21/2022 2.0  1.5 - 2.5 mg/dL Final   Phosphorus 88/83/7976 3.3  2.3 - 4.6 mg/dL Final   Cholesterol 88/83/7976 209 (H)  0 - 200 mg/dL Final   Triglycerides 88/83/7976 224.0 (H)  0.0 - 149.0 mg/dL Final   HDL 88/83/7976 31.30 (L)  >60.99 mg/dL Final   VLDL 88/83/7976 44.8 (H)  0.0 - 40.0 mg/dL Final   Total CHOL/HDL Ratio 09/21/2022 7   Final   NonHDL 09/21/2022 177.86   Final   Sodium 09/21/2022 138  135 - 145 mEq/L Final   Potassium 09/21/2022 4.1  3.5 - 5.1 mEq/L Final   Chloride 09/21/2022 101  96 - 112 mEq/L Final   CO2 09/21/2022 31  19 - 32 mEq/L Final   Glucose, Bld 09/21/2022 81  70 - 99 mg/dL Final   BUN 88/83/7976 11  6 - 23 mg/dL Final   Creatinine, Ser 09/21/2022 0.85  0.40 - 1.50 mg/dL Final   Total Bilirubin 09/21/2022 0.5  0.2 - 1.2 mg/dL Final   Alkaline Phosphatase 09/21/2022 93  39 - 117 U/L Final   AST 09/21/2022 17  0 - 37 U/L Final   ALT 09/21/2022 19  0 - 53 U/L Final   Total Protein 09/21/2022 7.2  6.0 - 8.3 g/dL Final   Albumin 88/83/7976 4.4  3.5 - 5.2 g/dL Final   GFR 88/83/7976 109.37  >60.00 mL/min Final   Calcium 09/21/2022 9.5  8.4 - 10.5 mg/dL Final   WBC 88/83/7976 8.4  4.0 - 10.5 K/uL Final   RBC 09/21/2022 5.56  4.22 - 5.81 Mil/uL Final   Platelets 09/21/2022 322.0  150.0 - 400.0 K/uL Final   Hemoglobin 09/21/2022 16.3  13.0 - 17.0 g/dL Final   HCT 88/83/7976 47.1  39.0 - 52.0 % Final   MCV 09/21/2022 84.8  78.0 - 100.0 fl Final   MCHC 09/21/2022 34.6  30.0 - 36.0 g/dL Final   RDW 88/83/7976 13.3  11.5 - 15.5 % Final   Hepatitis C Ab 09/21/2022 NON-REACTIVE  NON-REACTIVE Final   Direct LDL 09/21/2022 140.0  mg/dL Final  Admission on 89/71/7976, Discharged on 09/07/2022  Component Date Value Ref Range Status   Sodium 09/03/2022 133 (L)  135 - 145 mmol/L Final   Potassium  09/03/2022 4.1  3.5 - 5.1 mmol/L Final   Chloride 09/03/2022 101  98 - 111 mmol/L Final   CO2 09/03/2022 24  22 -  32 mmol/L Final   Glucose, Bld 09/03/2022 131 (H)  70 - 99 mg/dL Final   BUN 89/70/7976 10  6 - 20 mg/dL Final   Creatinine, Ser 09/03/2022 0.72  0.61 - 1.24 mg/dL Final   Calcium 89/70/7976 8.0 (L)  8.9 - 10.3 mg/dL Final   Total Protein 89/70/7976 5.4 (L)  6.5 - 8.1 g/dL Final   Albumin 89/70/7976 2.1 (L)  3.5 - 5.0 g/dL Final   AST 89/70/7976 38  15 - 41 U/L Final   ALT 09/03/2022 49 (H)  0 - 44 U/L Final   Alkaline Phosphatase 09/03/2022 80  38 - 126 U/L Final   Total Bilirubin 09/03/2022 0.4  0.3 - 1.2 mg/dL Final   GFR, Estimated 09/03/2022 >60  >60 mL/min Final   Anion gap 09/03/2022 8  5 - 15 Final   WBC 09/03/2022 14.6 (H)  4.0 - 10.5 K/uL Final   RBC 09/03/2022 4.08 (L)  4.22 - 5.81 MIL/uL Final   Hemoglobin 09/03/2022 12.7 (L)  13.0 - 17.0 g/dL Final   HCT 89/70/7976 35.3 (L)  39.0 - 52.0 % Final   MCV 09/03/2022 86.5  80.0 - 100.0 fL Final   MCH 09/03/2022 31.1  26.0 - 34.0 pg Final   MCHC 09/03/2022 36.0  30.0 - 36.0 g/dL Final   RDW 89/70/7976 12.8  11.5 - 15.5 % Final   Platelets 09/03/2022 273  150 - 400 K/uL Final   nRBC 09/03/2022 0.0  0.0 - 0.2 % Final   WBC 09/04/2022 12.1 (H)  4.0 - 10.5 K/uL Final   RBC 09/04/2022 4.08 (L)  4.22 - 5.81 MIL/uL Final   Hemoglobin 09/04/2022 12.1 (L)  13.0 - 17.0 g/dL Final   HCT 89/69/7976 36.2 (L)  39.0 - 52.0 % Final   MCV 09/04/2022 88.7  80.0 - 100.0 fL Final   MCH 09/04/2022 29.7  26.0 - 34.0 pg Final   MCHC 09/04/2022 33.4  30.0 - 36.0 g/dL Final   RDW 89/69/7976 12.7  11.5 - 15.5 % Final   Platelets 09/04/2022 302  150 - 400 K/uL Final   nRBC 09/04/2022 0.0  0.0 - 0.2 % Final   Sodium 09/06/2022 137  135 - 145 mmol/L Final   Potassium 09/06/2022 3.5  3.5 - 5.1 mmol/L Final   Chloride 09/06/2022 104  98 - 111 mmol/L Final   CO2 09/06/2022 24  22 - 32 mmol/L Final   Glucose, Bld 09/06/2022 135 (H)  70 - 99 mg/dL Final   BUN 88/98/7976 8  6 - 20 mg/dL Final   Creatinine, Ser 09/06/2022 0.87  0.61 - 1.24 mg/dL Final    Calcium 88/98/7976 8.0 (L)  8.9 - 10.3 mg/dL Final   GFR, Estimated 09/06/2022 >60  >60 mL/min Final   Anion gap 09/06/2022 9  5 - 15 Final   WBC 09/06/2022 10.1  4.0 - 10.5 K/uL Final   RBC 09/06/2022 3.91 (L)  4.22 - 5.81 MIL/uL Final   Hemoglobin 09/06/2022 11.8 (L)  13.0 - 17.0 g/dL Final   HCT 88/98/7976 33.9 (L)  39.0 - 52.0 % Final   MCV 09/06/2022 86.7  80.0 - 100.0 fL Final   MCH 09/06/2022 30.2  26.0 - 34.0 pg Final   MCHC 09/06/2022 34.8  30.0 - 36.0 g/dL Final   RDW 88/98/7976 12.7  11.5 - 15.5 % Final   Platelets 09/06/2022 341  150 - 400 K/uL Final   nRBC 09/06/2022 0.0  0.0 - 0.2 % Final   WBC 09/07/2022 11.2 (H)  4.0 - 10.5 K/uL Final   RBC 09/07/2022 4.19 (L)  4.22 - 5.81 MIL/uL Final   Hemoglobin 09/07/2022 12.5 (L)  13.0 - 17.0 g/dL Final   HCT 88/97/7976 36.8 (L)  39.0 - 52.0 % Final   MCV 09/07/2022 87.8  80.0 - 100.0 fL Final   MCH 09/07/2022 29.8  26.0 - 34.0 pg Final   MCHC 09/07/2022 34.0  30.0 - 36.0 g/dL Final   RDW 88/97/7976 12.2  11.5 - 15.5 % Final   Platelets 09/07/2022 378  150 - 400 K/uL Final   nRBC 09/07/2022 0.0  0.0 - 0.2 % Final   Neutrophils Relative % 09/07/2022 66  % Final   Neutro Abs 09/07/2022 7.4  1.7 - 7.7 K/uL Final   Lymphocytes Relative 09/07/2022 19  % Final   Lymphs Abs 09/07/2022 2.1  0.7 - 4.0 K/uL Final   Monocytes Relative 09/07/2022 7  % Final   Monocytes Absolute 09/07/2022 0.8  0.1 - 1.0 K/uL Final   Eosinophils Relative 09/07/2022 5  % Final   Eosinophils Absolute 09/07/2022 0.6 (H)  0.0 - 0.5 K/uL Final   Basophils Relative 09/07/2022 1  % Final   Basophils Absolute 09/07/2022 0.1  0.0 - 0.1 K/uL Final   Immature Granulocytes 09/07/2022 2  % Final   Abs Immature Granulocytes 09/07/2022 0.22 (H)  0.00 - 0.07 K/uL Final   Sodium 09/07/2022 140  135 - 145 mmol/L Final   Potassium 09/07/2022 4.0  3.5 - 5.1 mmol/L Final   Chloride 09/07/2022 110  98 - 111 mmol/L Final   CO2 09/07/2022 24  22 - 32 mmol/L Final   Glucose,  Bld 09/07/2022 113 (H)  70 - 99 mg/dL Final   BUN 88/97/7976 5 (L)  6 - 20 mg/dL Final   Creatinine, Ser 09/07/2022 0.86  0.61 - 1.24 mg/dL Final   Calcium 88/97/7976 8.5 (L)  8.9 - 10.3 mg/dL Final   Total Protein 88/97/7976 5.2 (L)  6.5 - 8.1 g/dL Final   Albumin 88/97/7976 2.3 (L)  3.5 - 5.0 g/dL Final   AST 88/97/7976 28  15 - 41 U/L Final   ALT 09/07/2022 39  0 - 44 U/L Final   Alkaline Phosphatase 09/07/2022 69  38 - 126 U/L Final   Total Bilirubin 09/07/2022 0.6  0.3 - 1.2 mg/dL Final   GFR, Estimated 09/07/2022 >60  >60 mL/min Final   Anion gap 09/07/2022 6  5 - 15 Final   Magnesium  09/07/2022 2.0  1.7 - 2.4 mg/dL Final   Phosphorus 88/97/7976 4.0  2.5 - 4.6 mg/dL Final  Admission on 89/74/7976, Discharged on 09/02/2022  Component Date Value Ref Range Status   WBC 08/30/2022 25.5 (H)  4.0 - 10.5 K/uL Final   RBC 08/30/2022 5.46  4.22 - 5.81 MIL/uL Final   Hemoglobin 08/30/2022 16.2  13.0 - 17.0 g/dL Final   HCT 89/74/7976 47.7  39.0 - 52.0 % Final   MCV 08/30/2022 87.4  80.0 - 100.0 fL Final   MCH 08/30/2022 29.7  26.0 - 34.0 pg Final   MCHC 08/30/2022 34.0  30.0 - 36.0 g/dL Final   RDW 89/74/7976 13.0  11.5 - 15.5 % Final   Platelets 08/30/2022 362  150 - 400 K/uL Final   nRBC 08/30/2022 0.0  0.0 -  0.2 % Final   Neutrophils Relative % 08/30/2022 90  % Final   Neutro Abs 08/30/2022 23.0 (H)  1.7 - 7.7 K/uL Final   Lymphocytes Relative 08/30/2022 3  % Final   Lymphs Abs 08/30/2022 0.8  0.7 - 4.0 K/uL Final   Monocytes Relative 08/30/2022 6  % Final   Monocytes Absolute 08/30/2022 1.4 (H)  0.1 - 1.0 K/uL Final   Eosinophils Relative 08/30/2022 0  % Final   Eosinophils Absolute 08/30/2022 0.0  0.0 - 0.5 K/uL Final   Basophils Relative 08/30/2022 0  % Final   Basophils Absolute 08/30/2022 0.1  0.0 - 0.1 K/uL Final   WBC Morphology 08/30/2022 MORPHOLOGY UNREMARKABLE   Final   RBC Morphology 08/30/2022 MORPHOLOGY UNREMARKABLE   Final   Smear Review 08/30/2022 Normal platelet  morphology   Final   Immature Granulocytes 08/30/2022 1  % Final   Abs Immature Granulocytes 08/30/2022 0.20 (H)  0.00 - 0.07 K/uL Final   Sodium 08/30/2022 135  135 - 145 mmol/L Final   Potassium 08/30/2022 4.7  3.5 - 5.1 mmol/L Final   Chloride 08/30/2022 103  98 - 111 mmol/L Final   CO2 08/30/2022 20 (L)  22 - 32 mmol/L Final   Glucose, Bld 08/30/2022 150 (H)  70 - 99 mg/dL Final   BUN 89/74/7976 13  6 - 20 mg/dL Final   Creatinine, Ser 08/30/2022 0.82  0.61 - 1.24 mg/dL Final   Calcium 89/74/7976 9.5  8.9 - 10.3 mg/dL Final   Total Protein 89/74/7976 8.6 (H)  6.5 - 8.1 g/dL Final   Albumin 89/74/7976 4.3  3.5 - 5.0 g/dL Final   AST 89/74/7976 38  15 - 41 U/L Final   ALT 08/30/2022 82 (H)  0 - 44 U/L Final   Alkaline Phosphatase 08/30/2022 104  38 - 126 U/L Final   Total Bilirubin 08/30/2022 1.9 (H)  0.3 - 1.2 mg/dL Final   GFR, Estimated 08/30/2022 >60  >60 mL/min Final   Anion gap 08/30/2022 12  5 - 15 Final   Troponin I (High Sensitivity) 08/30/2022 4  <18 ng/L Final   Specimen Description 08/30/2022 BLOOD RIGHT HAND   Final   Special Requests 08/30/2022 BOTTLES DRAWN AEROBIC AND ANAEROBIC Blood Culture adequate volume   Final   Culture 08/30/2022    Final                   Value:NO GROWTH 5 DAYS Performed at [facility], [address] [place], [place] [address]    Report Status 08/30/2022 09/04/2022 FINAL   Final   Specimen Description 08/30/2022 RIGHT ANTECUBITAL   Final   Special Requests 08/30/2022 BOTTLES DRAWN AEROBIC AND ANAEROBIC Blood Culture results may not be optimal due to an excessive volume of blood received in culture bottles   Final   Culture 08/30/2022    Final                   Value:NO GROWTH 5 DAYS Performed at [facility], [address], [place], [place] [address]    Report Status 08/30/2022 09/04/2022 FINAL   Final   Lactic Acid, Venous 08/30/2022 2.0 (HH)  0.5 - 1.9 mmol/L Final   SARS Coronavirus 2 by RT PCR 08/30/2022 NEGATIVE   NEGATIVE Final   Lactic Acid, Venous 08/30/2022 2.2 (HH)  0.5 - 1.9 mmol/L Final   Troponin I (High Sensitivity) 08/30/2022 3  <18 ng/L Final   Strep Pneumo Urinary Antigen 08/31/2022 NEGATIVE  NEGATIVE Final   L. pneumophila Serogp 1 Ur Ag 08/31/2022 Negative  Negative Final   Source of Sample 08/31/2022 URINE, RANDOM   Final   MRSA by PCR Next Gen 08/31/2022 NOT DETECTED  NOT DETECTED Final   WBC 08/31/2022 22.1 (H)  4.0 - 10.5 K/uL Final   RBC 08/31/2022 4.78  4.22 - 5.81 MIL/uL Final   Hemoglobin 08/31/2022 14.0  13.0 - 17.0 g/dL Final   HCT 89/73/7976 41.9  39.0 - 52.0 % Final   MCV 08/31/2022 87.7  80.0 - 100.0 fL Final   MCH 08/31/2022 29.3  26.0 - 34.0 pg Final   MCHC 08/31/2022 33.4  30.0 - 36.0 g/dL Final   RDW 89/73/7976 12.9  11.5 - 15.5 % Final   Platelets 08/31/2022 288  150 - 400 K/uL Final   nRBC 08/31/2022 0.0  0.0 - 0.2 % Final   Neutrophils Relative % 08/31/2022 87  % Final   Neutro Abs 08/31/2022 19.1 (H)  1.7 - 7.7 K/uL Final   Lymphocytes Relative 08/31/2022 4  % Final   Lymphs Abs 08/31/2022 0.9  0.7 - 4.0 K/uL Final   Monocytes Relative 08/31/2022 8  % Final   Monocytes Absolute 08/31/2022 1.8 (H)  0.1 - 1.0 K/uL Final   Eosinophils Relative 08/31/2022 0  % Final   Eosinophils Absolute 08/31/2022 0.0  0.0 - 0.5 K/uL Final   Basophils Relative 08/31/2022 0  % Final   Basophils Absolute 08/31/2022 0.1  0.0 - 0.1 K/uL Final   Immature Granulocytes 08/31/2022 1  % Final   Abs Immature Granulocytes 08/31/2022 0.18 (H)  0.00 - 0.07 K/uL Final   Sodium 08/31/2022 135  135 - 145 mmol/L Final   Potassium 08/31/2022 3.7  3.5 - 5.1 mmol/L Final   Chloride 08/31/2022 106  98 - 111 mmol/L Final   CO2 08/31/2022 21 (L)  22 - 32 mmol/L Final   Glucose, Bld 08/31/2022 116 (H)  70 - 99 mg/dL Final   BUN 89/73/7976 15  6 - 20 mg/dL Final   Creatinine, Ser 08/31/2022 0.78  0.61 - 1.24 mg/dL Final   Calcium 89/73/7976 7.8 (L)  8.9 - 10.3 mg/dL Final   Total Protein  08/31/2022 6.1 (L)  6.5 - 8.1 g/dL Final   Albumin 89/73/7976 3.1 (L)  3.5 - 5.0 g/dL Final   AST 89/73/7976 21  15 - 41 U/L Final   ALT 08/31/2022 46 (H)  0 - 44 U/L Final   Alkaline Phosphatase 08/31/2022 70  38 - 126 U/L Final   Total Bilirubin 08/31/2022 2.0 (H)  0.3 - 1.2 mg/dL Final   GFR, Estimated 08/31/2022 >60  >60 mL/min Final   Anion gap 08/31/2022 8  5 - 15 Final   Hgb A1c MFr Bld 08/31/2022 4.7 (L)  4.8 - 5.6 % Final   Mean Plasma Glucose 08/31/2022 88.19  mg/dL Final   O2 Content 89/74/7976 3.0  L/min Final   Delivery systems 08/30/2022 NASAL CANNULA   Final   pH, Arterial 08/30/2022 7.43  7.35 - 7.45 Final   pCO2 arterial 08/30/2022 36  32 - 48 mmHg Final   pO2, Arterial 08/30/2022 69 (L)  83 - 108 mmHg Final   Bicarbonate 08/30/2022 23.9  20.0 - 28.0 mmol/L Final   Acid-base deficit 08/30/2022 0.1  0.0 - 2.0 mmol/L Final   O2 Saturation 08/30/2022 97.3  % Final   Patient temperature 08/30/2022 37.0   Final   Collection site 08/30/2022 RIGHT RADIAL  Final   Allens test (pass/fail) 08/30/2022 PASS  PASS Final   Specimen Description 08/31/2022    Final                   Value:PLEURAL Performed at [facility], [address] [place], [place] [address]    Special Requests 08/31/2022    Final                   Value:NONE Performed at [facility], [address] [place], [place] [address]    Gram Stain 08/31/2022    Final                   Value:MODERATE WBC PRESENT, PREDOMINANTLY PMN NO ORGANISMS SEEN    Culture 08/31/2022    Final                   Value:NO GROWTH 3 DAYS Performed at [facility], [address] [place], [place] [address]    Report Status 08/31/2022 09/03/2022 FINAL   Final   Fluid Type-FCT 08/30/2022 PLEURAL   Final   Color, Fluid 08/30/2022 AMBER (A)  YELLOW Final   Appearance, Fluid 08/30/2022 CLOUDY (A)  CLEAR Final   Total Nucleated Cell Count, Fluid 08/30/2022 36,950  cu mm Final   Neutrophil Count, Fluid  08/30/2022 96  % Final   Lymphs, Fluid 08/30/2022 2  % Final   Monocyte-Macrophage-Serous Fluid 08/30/2022 1  % Final   Eos, Fluid 08/30/2022 1  % Final   Other Cells, Fluid 08/30/2022 RBCS  % Final   Glucose, Fluid 08/30/2022 78  mg/dL Final   Fluid Type-FGLU 08/30/2022 PLEURAL   Final   Total protein, fluid 08/30/2022 5.6  g/dL Final   Fluid Type-FTP 08/30/2022 PLEURAL   Final   LD, Fluid 08/30/2022 938 (H)  3 - 23 U/L Final   Fluid Type-FLDH 08/30/2022 PLEURAL   Final   Triglycerides, Fluid 08/30/2022 49  Not Estab. mg/dL Final   Fluid Type-FTRIG 08/30/2022 PLEURAL   Final   Cholesterol, Fluid 08/30/2022 93  mg/dL Final   Chol, Fluid Type 08/30/2022 PLEURAL   Final   Albumin 08/31/2022 3.5  3.5 - 5.0 g/dL Final   Lactic Acid, Venous 08/31/2022 1.5  0.5 - 1.9 mmol/L Final   Lactic Acid, Venous 08/31/2022 1.1  0.5 - 1.9 mmol/L Final   Magnesium  08/31/2022 1.4 (L)  1.7 - 2.4 mg/dL Final   Phosphorus 89/73/7976 2.4 (L)  2.5 - 4.6 mg/dL Final   HIV Screen 4th Generation wRfx 08/31/2022 Non Reactive  Non Reactive Final   Procalcitonin 08/31/2022 2.15  ng/mL Final   Color, Urine 08/31/2022 YELLOW (A)  YELLOW Final   APPearance 08/31/2022 CLEAR (A)  CLEAR Final   Specific Gravity, Urine 08/31/2022 1.032 (H)  1.005 - 1.030 Final   pH 08/31/2022 5.0  5.0 - 8.0 Final   Glucose, UA 08/31/2022 NEGATIVE  NEGATIVE mg/dL Final   Hgb urine dipstick 08/31/2022 NEGATIVE  NEGATIVE Final   Bilirubin Urine 08/31/2022 NEGATIVE  NEGATIVE Final   Ketones, ur 08/31/2022 NEGATIVE  NEGATIVE mg/dL Final   Protein, ur 89/73/7976 NEGATIVE  NEGATIVE mg/dL Final   Nitrite 89/73/7976 NEGATIVE  NEGATIVE Final   Leukocytes,Ua 08/31/2022 NEGATIVE  NEGATIVE Final   Path Review 08/30/2022 Cytospin of pleural fluid is reviewed.   Final   Glucose-Capillary 08/31/2022 125 (H)  70 - 99 mg/dL Final   Glucose-Capillary 08/31/2022 122 (H)  70 - 99 mg/dL Final  Comment 1 08/31/2022 Notify RN   Final   Comment 2 08/31/2022  Document in Chart   Final   Glucose-Capillary 08/31/2022 120 (H)  70 - 99 mg/dL Final   Glucose-Capillary 08/31/2022 118 (H)  70 - 99 mg/dL Final   Glucose-Capillary 08/31/2022 135 (H)  70 - 99 mg/dL Final   Comment 1 89/73/7976 Notify RN   Final   Glucose-Capillary 08/31/2022 112 (H)  70 - 99 mg/dL Final   WBC 89/72/7976 24.3 (H)  4.0 - 10.5 K/uL Final   RBC 09/01/2022 4.75  4.22 - 5.81 MIL/uL Final   Hemoglobin 09/01/2022 13.9  13.0 - 17.0 g/dL Final   HCT 89/72/7976 41.1  39.0 - 52.0 % Final   MCV 09/01/2022 86.5  80.0 - 100.0 fL Final   MCH 09/01/2022 29.3  26.0 - 34.0 pg Final   MCHC 09/01/2022 33.8  30.0 - 36.0 g/dL Final   RDW 89/72/7976 12.8  11.5 - 15.5 % Final   Platelets 09/01/2022 277  150 - 400 K/uL Final   nRBC 09/01/2022 0.0  0.0 - 0.2 % Final   Sodium 09/01/2022 130 (L)  135 - 145 mmol/L Final   Potassium 09/01/2022 4.1  3.5 - 5.1 mmol/L Final   Chloride 09/01/2022 98  98 - 111 mmol/L Final   CO2 09/01/2022 23  22 - 32 mmol/L Final   Glucose, Bld 09/01/2022 159 (H)  70 - 99 mg/dL Final   BUN 89/72/7976 15  6 - 20 mg/dL Final   Creatinine, Ser 09/01/2022 0.79  0.61 - 1.24 mg/dL Final   Calcium 89/72/7976 8.4 (L)  8.9 - 10.3 mg/dL Final   GFR, Estimated 09/01/2022 >60  >60 mL/min Final   Anion gap 09/01/2022 9  5 - 15 Final   Magnesium  09/01/2022 2.0  1.7 - 2.4 mg/dL Final   Phosphorus 89/72/7976 2.4 (L)  2.5 - 4.6 mg/dL Final   Glucose-Capillary 08/31/2022 123 (H)  70 - 99 mg/dL Final   Glucose-Capillary 08/31/2022 144 (H)  70 - 99 mg/dL Final   Glucose-Capillary 09/01/2022 116 (H)  70 - 99 mg/dL Final   Glucose-Capillary 09/01/2022 110 (H)  70 - 99 mg/dL Final   Glucose-Capillary 09/01/2022 111 (H)  70 - 99 mg/dL Final   Glucose-Capillary 09/01/2022 115 (H)  70 - 99 mg/dL Final   Glucose-Capillary 09/02/2022 145 (H)  70 - 99 mg/dL Final   Glucose-Capillary 09/02/2022 150 (H)  70 - 99 mg/dL Final   Glucose-Capillary 09/02/2022 97  70 - 99 mg/dL Final    Glucose-Capillary 09/02/2022 139 (H)  70 - 99 mg/dL Final   Glucose-Capillary 09/02/2022 151 (H)  70 - 99 mg/dL Final   Glucose-Capillary 09/02/2022 149 (H)  70 - 99 mg/dL Final  No image results found. No results found.       ASSESSMENT & PLAN   Assessment & Plan Opioid dependence with opioid-induced disorder (HCC) High risk medication use Opioid dependence in remission on medication-assisted therapy   Opioid dependence remains in remission with Suboxone . He reports sobriety and adherence to medication. Continue Suboxone  as prescribed. Send prescription to [facility]. Schedule a 60-day follow-up, preferably via video. Continue(s) with pillcounts for diversion prevention PDMP reviewed during this encounter.  History of smoking Chronic bronchitis is well-managed with no flares in over a year since smoking cessation. Advise obtaining Capsovax pneumonia vaccine at the pharmacy. Bipolar 1 disorder (HCC) Bipolar disorder is stable with no reported flares. He maintains good quality sleep, aiding in flare prevention.  ORDER ASSOCIATIONS  #   DIAGNOSIS / CONDITION ICD-10 ENCOUNTER ORDER     ICD-10-CM   1. Opioid dependence with opioid-induced disorder (HCC)  F11.29 buprenorphine -naloxone  (SUBOXONE ) 8-2 mg SUBL SL tablet    2. High risk medication use  Z79.899 buprenorphine -naloxone  (SUBOXONE ) 8-2 mg SUBL SL tablet    3. History of smoking  Z87.891     4. Bipolar 1 disorder (HCC)  F31.9      Meds ordered this encounter  Medications   buprenorphine -naloxone  (SUBOXONE ) 8-2 mg SUBL SL tablet    Sig: Place 2 tablets under the tongue daily. Fill when due.    Dispense:  60 tablet    Refill:  1       This document was synthesized by artificial intelligence (Abridge) using HIPAA-compliant recording of the clinical interaction;   We discussed the use of AI scribe software for clinical note transcription with the patient, who gave verbal consent to proceed. additional Info: This  encounter employed state-of-the-art, real-time, collaborative documentation. The patient actively reviewed and assisted in updating their electronic medical record on a shared screen, ensuring transparency and facilitating joint problem-solving for the problem list, overview, and plan. This approach promotes accurate, informed care. The treatment plan was discussed and reviewed in detail, including medication safety, potential side effects, and all patient questions. We confirmed understanding and comfort with the plan. Follow-up instructions were established, including contacting the office for any concerns, returning if symptoms worsen, persist, or new symptoms develop, and precautions for potential emergency department visits.

## 2024-06-23 NOTE — Patient Instructions (Addendum)
 It was a pleasure seeing you today! Your health and satisfaction are our top priorities.  Carl Cone, MD  VISIT SUMMARY: Today, you came in for a routine follow-up appointment. We discussed your chronic bronchitis, bipolar disorder, and opioid dependence. Overall, you are doing well with no recent flares of bronchitis or bipolar disorder, and you are maintaining sobriety with your current medication regimen.  YOUR PLAN: -OPIOID DEPENDENCE IN REMISSION: Opioid dependence means you have a history of relying on opioids, but you are currently in remission, meaning you are not using them. You are doing well on Suboxone , which helps prevent relapse. Continue taking Suboxone  as prescribed. Your prescription has been sent to [facility]. We will schedule a follow-up in 60 days, preferably via video.  -BIPOLAR DISORDER, STABLE: Bipolar disorder is a mental health condition that causes extreme mood swings. Your condition is stable, and you have not experienced any recent mood swings. Good quality sleep is helping to prevent flares. Continue with your current routine.  -CHRONIC BRONCHITIS, STABLE: Chronic bronchitis is a long-term inflammation of the airways in the lungs. Your condition is stable with no flares in over a year since you quit smoking. It is recommended that you get the Capsuvax pneumonia vaccine at the pharmacy to help prevent lung infections.  INSTRUCTIONS: Please schedule a follow-up appointment in 60 days, preferably via video. Additionally, obtain the Capsuvax pneumonia vaccine at the pharmacy.  Your Providers PCP: Le Carl MATSU, MD,  [phone]) Referring Provider: Cone Carl MATSU, MD,  [phone])  NEXT STEPS: [x]  Early Intervention: Schedule sooner appointment, call our on-call services, or go to emergency room if there is any significant Increase in pain or discomfort New or worsening symptoms Sudden or severe changes in your health [x]  Flexible Follow-Up: We  recommend a Return in about 2 months (around 08/23/2024) for review problems and medications. for optimal routine care. This allows for progress monitoring and treatment adjustments. [x]  Preventive Care: Schedule your annual preventive care visit! It's typically covered by insurance and helps identify potential health issues early. [x]  Lab & X-ray Appointments: Incomplete tests scheduled today, or call to schedule. X-rays: [facility] (M-F, 8:30am-noon or 1pm-5pm). [x]  Medical Information Release: Sign a release form at front desk to obtain relevant medical information we don't have.  MAKING THE MOST OF OUR FOCUSED 20 MINUTE APPOINTMENTS: [x]   Clearly state your top concerns at the beginning of the visit to focus our discussion [x]   If you anticipate you will need more time, please inform the front desk during scheduling - we can book multiple appointments in the same week. [x]   If you have transportation problems- use our convenient video appointments or ask about transportation support. [x]   We can get down to business faster if you use MyChart to update information before the visit and submit non-urgent questions before your visit. Thank you for taking the time to provide details through MyChart.  Let our nurse know and she can import this information into your encounter documents.  Arrival and Wait Times: [x]   Arriving on time ensures that everyone receives prompt attention. [x]   Early morning (8a) and afternoon (1p) appointments tend to have shortest wait times. [x]   Unfortunately, we cannot delay appointments for late arrivals or hold slots during phone calls.  Getting Answers and Following Up [x]   Simple Questions & Concerns: For quick questions or basic follow-up after your visit, reach us  at [phone] or MyChart messaging. [x]   Complex Concerns: If your concern is  more complex, scheduling an appointment might be best. Discuss this with the staff to find the most  suitable option. [x]   Lab & Imaging Results: We'll contact you directly if results are abnormal or you don't use MyChart. Most normal results will be on MyChart within 2-3 business days, with a review message from Dr. Jesus. Haven't heard back in 2 weeks? Need results sooner? Contact us  at [phone]. [x]   Referrals: Our referral coordinator will manage specialist referrals. The specialist's office should contact you within 2 weeks to schedule an appointment. Call us  if you haven't heard from them after 2 weeks.  Staying Connected [x]   MyChart: Activate your MyChart for the fastest way to access results and message us . See the last page of this paperwork for instructions on how to activate.  Bring to Your Next Appointment [x]   Medications: Please bring all your medication bottles to your next appointment to ensure we have an accurate record of your prescriptions. [x]   Health Diaries: If you're monitoring any health conditions at home, keeping a diary of your readings can be very helpful for discussions at your next appointment.  Billing [x]   X-ray & Lab Orders: These are billed by separate companies. Contact the invoicing company directly for questions or concerns. [x]   Visit Charges: Discuss any billing inquiries with our administrative services team.  Your Satisfaction Matters [x]   Share Your Experience: We strive for your satisfaction! If you have any complaints, or preferably compliments, please let Dr. Jesus know directly or contact our Practice Administrators, Manuelita Rubin or [employer], by asking at the front desk.   Reviewing Your Records [x]   Review this early draft of your clinical encounter notes below and the final encounter summary tomorrow on MyChart after its been completed.  All orders placed so far are visible here: Opioid dependence with opioid-induced disorder (HCC) -     Buprenorphine  HCl-Naloxone  HCl; Place 2 tablets under the tongue daily. Fill when due.   Dispense: 60 tablet; Refill: 1  High risk medication use -     Buprenorphine  HCl-Naloxone  HCl; Place 2 tablets under the tongue daily. Fill when due.  Dispense: 60 tablet; Refill: 1  History of smoking  Bipolar 1 disorder (HCC)

## 2024-06-23 NOTE — Assessment & Plan Note (Signed)
 Chronic bronchitis is well-managed with no flares in over a year since smoking cessation. Advise obtaining Capsovax pneumonia vaccine at the pharmacy.

## 2024-06-23 NOTE — Assessment & Plan Note (Signed)
 Bipolar disorder is stable with no reported flares. He maintains good quality sleep, aiding in flare prevention.

## 2024-07-18 DIAGNOSIS — Z419 Encounter for procedure for purposes other than remedying health state, unspecified: Secondary | ICD-10-CM | POA: Diagnosis not present

## 2024-08-09 ENCOUNTER — Encounter: Payer: Self-pay | Admitting: Internal Medicine

## 2024-08-22 ENCOUNTER — Ambulatory Visit: Admitting: Internal Medicine

## 2024-08-22 ENCOUNTER — Encounter: Payer: Self-pay | Admitting: Internal Medicine

## 2024-08-22 ENCOUNTER — Telehealth: Admitting: Internal Medicine

## 2024-08-22 VITALS — Ht 72.0 in | Wt 150.0 lb

## 2024-08-22 DIAGNOSIS — F419 Anxiety disorder, unspecified: Secondary | ICD-10-CM | POA: Diagnosis not present

## 2024-08-22 DIAGNOSIS — F319 Bipolar disorder, unspecified: Secondary | ICD-10-CM

## 2024-08-22 DIAGNOSIS — B351 Tinea unguium: Secondary | ICD-10-CM | POA: Diagnosis not present

## 2024-08-22 DIAGNOSIS — Z79899 Other long term (current) drug therapy: Secondary | ICD-10-CM | POA: Diagnosis not present

## 2024-08-22 DIAGNOSIS — F1129 Opioid dependence with unspecified opioid-induced disorder: Secondary | ICD-10-CM

## 2024-08-22 MED ORDER — SERTRALINE HCL 50 MG PO TABS: 50.0000 mg | ORAL_TABLET | Freq: Every day | ORAL | 4 refills | Status: AC

## 2024-08-22 MED ORDER — CICLOPIROX 8 % EX SOLN: Freq: Every day | CUTANEOUS | 2 refills | Status: AC

## 2024-08-22 MED ORDER — TERBINAFINE HCL 250 MG PO TABS: 250.0000 mg | ORAL_TABLET | Freq: Every day | ORAL | 0 refills | Status: AC

## 2024-08-22 MED ORDER — BUPRENORPHINE HCL-NALOXONE HCL 8-2 MG SL SUBL: 2.0000 | SUBLINGUAL_TABLET | Freq: Every day | SUBLINGUAL | 2 refills | Status: AC

## 2024-08-22 MED ORDER — ARIPIPRAZOLE 5 MG PO TABS: 5.0000 mg | ORAL_TABLET | Freq: Every day | ORAL | 4 refills | Status: AC

## 2024-08-22 NOTE — Progress Notes (Unsigned)
 ====================================  [facility]: [phone]   --  Virtual Video Medical Office Visit --  Patient: Carl Le      Age: 43 y.o.       Sex:  male  Date:   08/22/2024 Today's Healthcare Provider: Bernardino KANDICE Cone, MD  ====================================    Chief Complaint/Reason For Visit: Medication Refill (Asking for Suboxone  refilled. Patient doestn have anything else to discuss. )   { Pre-visit Review: Medication Check Complete 01/25/24 Problem List as of 08/22/2024 Reviewed: 06/23/2024  9:04 AM by Cone Bernardino KANDICE, MD    Bipolar 1 disorder [facility])   Last Assessment & Plan 06/23/2024 Telemedicine Written 06/23/2024  9:03 AM by Cone Bernardino KANDICE, MD  Bipolar disorder is stable with no reported flares. He maintains good quality sleep, aiding in flare prevention.      Chronic bronchitis [facility])   Last Assessment & Plan 12/12/2023 Video Visit Written 12/13/2023  8:31 PM by Cone Bernardino KANDICE, MD  Chronic Bronchitis with Recurrent Pneumonia Chronic bronchitis with recurrent pneumonia requires avoiding smoking and using nicotine  alternatives. Discussed the importance of pneumonia vaccinations, recommending Capzivax as the best option. Instructed to inform the pharmacy of the condition to facilitate insurance coverage for the vaccine.      High risk medication use   Last Assessment & Plan 06/23/2024 Telemedicine Written 06/23/2024  9:03 AM by Cone Bernardino KANDICE, MD       History of incarceration   Last Assessment & Plan 02/05/2023 Office Visit Written 02/05/2023  4:52 PM by Cone Bernardino KANDICE, MD  Encouraged patient to call 211 to help with social determinants of health recovery      History of smoking   Last Assessment & Plan 06/23/2024 Telemedicine Written 06/23/2024  9:03 AM by Cone Bernardino KANDICE, MD  Chronic bronchitis is well-managed with no flares in over a year since smoking cessation. Advise obtaining Capsovax pneumonia vaccine at the  pharmacy.      Malnutrition of moderate degree   Last Assessment & Plan 04/16/2024 Telemedicine Written 04/19/2024  6:58 AM by Cone Bernardino KANDICE, MD  Significant dental issues cause eating difficulties and a 20-pound weight loss over six months. Awaiting Medicaid approval for dentures. Suspected multiple vitamin deficiencies due to a limited diet. Discussed dietary changes and supplements. Insurance may cover nutritional supplements if malnutrition is documented, though unlikely. Recommend a diet including eggs, cereal (e.g., Cheerios, Honey Bunches of Oats), and Boost if affordable. Suggest iron, calcium, vitamin D, B12, folate, vitamin C, and omega-3 fatty acid supplements. Document moderate malnutrition to attempt insurance coverage for Boost. Advise contacting Medicaid to expedite dental approval.      Opioid dependence [facility]   Last Assessment & Plan 06/23/2024 Telemedicine Written 06/23/2024  9:03 AM by Cone Bernardino KANDICE, MD  Opioid dependence in remission on medication-assisted therapy   Opioid dependence remains in remission with Suboxone . He reports sobriety and adherence to medication. Continue Suboxone  as prescribed. Send prescription to [facility]. Schedule a 60-day follow-up, preferably via video. Continue(s) with pillcounts for diversion prevention PDMP reviewed during this encounter.       Poor dentition   Last Assessment & Plan 04/16/2024 Telemedicine Written 04/19/2024  6:58 AM by Cone Bernardino KANDICE, MD  Patient has plan for dental      Therapeutic opioid induced constipation   Last Assessment & Plan 09/17/2023 Office Visit Edited 09/17/2023  8:33 AM by Cone Bernardino KANDICE, MD  Constipation   He is managing with fiber  supplements since insurance does not cover Senokot. We will continue fiber supplements.      Vitamin D deficiency   Last Assessment & Plan 04/16/2024 Telemedicine Written 04/19/2024  6:58 AM by Jesus Bernardino MATSU, MD  Encouraged vitamin use      }  Chart reviewed:  has History of smoking; Chronic bronchitis (HCC); Opioid dependence (HCC); High risk medication use; History of incarceration; Bipolar 1 disorder (HCC); Therapeutic opioid induced constipation; Vitamin D deficiency; Poor dentition; and Malnutrition of moderate degree on their problem list. Chart reviewed:  has a past medical history of Allergy, Anxiety disorder (09/21/2022), Anxiety disorder (09/21/2022), Chest pain (09/21/2022), Community acquired pneumonia of right lower lobe of lung (08/30/2022), Current smoker, Depression, Disability examination (09/21/2022), Herpes zoster, High risk medication use (10/26/2022), Kidney calculus, Kidney stone (04/15/2014), Mild emphysema (HCC) (08/30/2022), Normocytic anemia (09/07/2022), Parapneumonic effusion (08/31/2022), Pneumonia (2023), Tobacco dependence (09/21/2022), and Tobacco use (09/02/2022). Discussed the use of AI scribe software for clinical note transcription with the patient, who gave verbal consent to proceed.  History of Present Illness    Medications reviewed: Current Outpatient Medications on File Prior to Visit  Medication Sig   ARIPiprazole  (ABILIFY ) 5 MG tablet Take 5 mg by mouth daily.   buprenorphine -naloxone  (SUBOXONE ) 8-2 mg SUBL SL tablet Place 2 tablets under the tongue daily. Fill when due.   naloxone  (NARCAN ) nasal spray 4 mg/0.1 mL Place 1 spray into the nose once for 1 dose. Use for over sedation for anyone suspected of overdose.   sertraline  (ZOLOFT ) 50 MG tablet Take 50 mg by mouth daily.   No current facility-administered medications on file prior to visit.  There are no discontinued medications.     Virtual Physical Exam: {Physical Exam} { Physical Exam  } General Appearance:  Well Developed, Well Nourished, No Acute Distress by Limited Video Assessment Pulmonary:  No Respiratory Distress Apparent. Normal Work of Breathing.   Neurological:  Awake, Alert. No Obvious Focal Neurological Deficits or Cognitive  Impairments.  Sensorium Seems Unclouded. Psychiatric:  Appropriate Mood, Pleasant Demeanor, Calm, Articulate, Good Mood  {Insert previous labs (optional):23779} {See past labs  Heme  Chem  Endocrine  Serology  Results Review (optional):1}   {Insert previous labs (optional):23779} {See past labs  Heme  Chem  Endocrine  Serology  Results Review (optional):1} No results found for any visits on 08/22/24. Office Visit on 09/17/2023  Component Date Value   Amphetamines, Urine 09/17/2023 Negative    Cannabinoid Quant, Ur 09/17/2023 Negative    Cocaine (Metab.) 09/17/2023 Negative    OPIATE QUANTITATIVE URINE 09/17/2023 Negative    PCP Quant, Ur 09/17/2023 Negative   No image results found. No results found.DG CHEST PORT 1 VIEW Result Date: 09/03/2022 CLINICAL DATA:  Parapneumonic effusion EXAM: PORTABLE CHEST 1 VIEW COMPARISON:  Prior chest x-ray yesterday FINDINGS: Right-sided chest tube remains in place. Slightly decreased right basilar airspace opacity. Persistent streaky opacities in the left lung base. Stable cardiac and mediastinal contours. No pneumothorax. No acute osseous abnormality. IMPRESSION: 1. Stable position of right-sided chest tube. 2. Improving right basilar airspace opacities. 3. Persistent left basilar atelectasis. Electronically Signed   By: Wilkie Lent M.D.   On: 09/03/2022 07:58   DG Chest Port 1 View Result Date: 09/02/2022 CLINICAL DATA:  Pleural effusion. EXAM: PORTABLE CHEST 1 VIEW COMPARISON:  09/01/2022 FINDINGS: There is a right pigtail thoracostomy tube overlying the right mid lung. This is unchanged in position from previous exam. No pneumothorax identified. Unchanged appearance of moderate to large right  pleural effusion. Similar appearance of small left pleural effusion with overlying atelectasis. IMPRESSION: 1. Stable position of right pigtail thoracostomy tube. No pneumothorax. 2. Unchanged moderate to large right pleural effusion. Electronically  Signed   By: Waddell Calk M.D.   On: 09/02/2022 08:15        ASSESSMENT & PLAN   Assessment & Plan Opioid dependence with opioid-induced disorder (HCC) Opioid dependence in remission on medication-assisted therapy   Opioid dependence remains in remission with Suboxone . He reports sobriety and adherence to medication. Continue Suboxone  as prescribed. Schedule a 60-day follow-up, preferably via video. Continue(s) with pillcounts for diversion prevention PDMP reviewed during this encounter.  High risk medication use  Bipolar 1 disorder (HCC)  Anxiety  Onychomycosis   {Assessment and Plan Assessment & Plan    }  ORDER ASSOCIATIONS  #   DIAGNOSIS / CONDITION ICD-10 ENCOUNTER ORDER  No diagnosis found.       Orders Placed in Encounter:   Lab Orders  No laboratory test(s) ordered today   Imaging Orders  No imaging studies ordered today   Referral Orders  No referral(s) requested today   No orders of the defined types were placed in this encounter.   No orders of the defined types were placed in this encounter.  ED Discharge Orders     None           Treatment plan discussed and reviewed in detail. Explained medication safety and potential side effects.  Answered all patient questions and confirmed understanding and comfort with the plan. Encouraged patient to contact our office if they have any questions or concerns.  Agreed on patient coming for a sooner office visit if symptoms worsen, persist, or new symptoms develop. Discussed precautions in case of needing to visit the Emergency Department.    ----------------------------------------------------- Attestation:  Today's Healthcare Provider Bernardino KANDICE Cone, MD was located at office at [facility] at [facility] [address] [place] [address].  The patient was located at his car not driving. All video encounter participant identities and locations confirmed visually and verbally.Today's  Telemedicine visit was conducted via synchronous Video after consent for telemedicine was obtained:  Video connection was never lost   This document was transcribed and resynthesized, in part, by artificial intelligence (Abridge)  using HIPAA-compliant recording of the clinical interaction;   We have discussed the our use of AI scribe software for clinical note transcription with the patient, who has given verbal consent to proceed.

## 2024-08-22 NOTE — Assessment & Plan Note (Signed)
 Opioid dependence in remission on medication-assisted therapy   Opioid dependence remains in remission with Suboxone . He reports sobriety and adherence to medication. Continue Suboxone  as prescribed. Schedule a 60-day follow-up, preferably via video. Continue(s) with pillcounts for diversion prevention PDMP reviewed during this encounter.

## 2024-08-24 ENCOUNTER — Encounter: Payer: Self-pay | Admitting: Internal Medicine

## 2024-08-24 NOTE — Patient Instructions (Signed)
 It was a pleasure seeing you today! Your health and satisfaction are our top priorities.  Carl Cone, MD  VISIT SUMMARY: During your follow-up visit, we discussed your ongoing recovery and medication management. You are doing well and maintaining sobriety, but you are concerned about the potential loss of Medicaid due to your income level, which could affect your ability to afford treatment and medication. We also addressed your mental health and toenail fungus issues.  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 We discussed the potential impact of losing Medicaid on your treatment. You should continue taking Suboxone  8-2 mg sublingual daily for the next 3 months. Try to keep your income below the Medicaid threshold to retain coverage, and contact our office immediately if you lose Medicaid coverage. We will follow up in 3 months.  -BIPOLAR DISORDER WITH COMORBID DEPRESSION AND ANXIETY SYMPTOMS: Your bipolar disorder, along with depression and anxiety, is being managed with Abilify  and Zoloft . The recent family stress has increased your symptoms, leading to the resumption of these medications. Losing Medicaid could affect your access to Abilify , which is expensive without insurance. Continue taking Abilify  5 mg daily and Zoloft  50 mg daily for the next year. We recommend seeing a therapist while you still have Medicaid coverage. Please contact our office if you lose Medicaid coverage.  -ONYCHOMYCOSIS: Onychomycosis is a fungal infection of the toenails. We discussed oral terbinafine and topical Penlac  lacquer as treatment options. You should take terbinafine (Lamisil) orally for 12 weeks and apply Penlac  lacquer topically to your toenails for the best results.  INSTRUCTIONS: Please follow up in 3 months for your opioid dependence management. If you lose Medicaid coverage,  contact our office immediately. Continue your current medications and see a therapist while you have Medicaid coverage.  Your Providers PCP: Le Carl MATSU, MD,  [phone]) Referring Provider: Cone Carl MATSU, MD,  [phone])  NEXT STEPS: [x]  Early Intervention: Schedule sooner appointment, call our on-call services, or go to emergency room if there is any significant Increase in pain or discomfort New or worsening symptoms Sudden or severe changes in your health [x]  Flexible Follow-Up: We recommend a No follow-ups on file. for optimal routine care. This allows for progress monitoring and treatment adjustments. [x]  Preventive Care: Schedule your annual preventive care visit! It's typically covered by insurance and helps identify potential health issues early. [x]  Lab & X-ray Appointments: Incomplete tests scheduled today, or call to schedule. X-rays: [facility] (M-F, 8:30am-noon or 1pm-5pm). [x]  Medical Information Release: Sign a release form at front desk to obtain relevant medical information we don't have.  MAKING THE MOST OF OUR FOCUSED 20 MINUTE APPOINTMENTS: [x]   Clearly state your top concerns at the beginning of the visit to focus our discussion [x]   If you anticipate you will need more time, please inform the front desk during scheduling - we can book multiple appointments in the same week. [x]   If you have transportation problems- use our convenient video appointments or ask about transportation support. [x]   We can get down to business faster if you use MyChart to update information before the visit and submit non-urgent questions before your visit. Thank you for taking the time to provide details through MyChart.  Let our nurse know and she can import this information into your encounter documents.  Arrival and Wait Times: [x]   Arriving on time ensures that everyone receives  prompt attention. [x]   Early morning (8a) and afternoon (1p) appointments  tend to have shortest wait times. [x]   Unfortunately, we cannot delay appointments for late arrivals or hold slots during phone calls.  Getting Answers and Following Up [x]   Simple Questions & Concerns: For quick questions or basic follow-up after your visit, reach us  at [phone] or MyChart messaging. [x]   Complex Concerns: If your concern is more complex, scheduling an appointment might be best. Discuss this with the staff to find the most suitable option. [x]   Lab & Imaging Results: We'll contact you directly if results are abnormal or you don't use MyChart. Most normal results will be on MyChart within 2-3 business days, with a review message from Dr. Jesus. Haven't heard back in 2 weeks? Need results sooner? Contact us  at [phone]. [x]   Referrals: Our referral coordinator will manage specialist referrals. The specialist's office should contact you within 2 weeks to schedule an appointment. Call us  if you haven't heard from them after 2 weeks.  Staying Connected [x]   MyChart: Activate your MyChart for the fastest way to access results and message us . See the last page of this paperwork for instructions on how to activate.  Bring to Your Next Appointment [x]   Medications: Please bring all your medication bottles to your next appointment to ensure we have an accurate record of your prescriptions. [x]   Health Diaries: If you're monitoring any health conditions at home, keeping a diary of your readings can be very helpful for discussions at your next appointment.  Billing [x]   X-ray & Lab Orders: These are billed by separate companies. Contact the invoicing company directly for questions or concerns. [x]   Visit Charges: Discuss any billing inquiries with our administrative services team.  Your Satisfaction Matters [x]   Share Your Experience: We strive for your satisfaction! If you have any complaints, or preferably compliments, please let Dr. Jesus know directly or contact our  Practice Administrators, Manuelita Rubin or [employer], by asking at the front desk.   Reviewing Your Records [x]   Review this early draft of your clinical encounter notes below and the final encounter summary tomorrow on MyChart after its been completed.  All orders placed so far are visible here: Opioid dependence with opioid-induced disorder [facility]) Assessment & Plan: Opioid dependence in remission on medication-assisted therapy   Opioid dependence remains in remission with Suboxone . He reports sobriety and adherence to medication. Continue Suboxone  as prescribed. Schedule a 60-day follow-up, preferably via video. Continue(s) with pillcounts for diversion prevention PDMP reviewed during this encounter.   Orders: -     Buprenorphine  HCl-Naloxone  HCl; Place 2 tablets under the tongue daily. Fill when due.  Dispense: 60 tablet; Refill: 2 -     Ambulatory referral to Psychology  High risk medication use -     Buprenorphine  HCl-Naloxone  HCl; Place 2 tablets under the tongue daily. Fill when due.  Dispense: 60 tablet; Refill: 2 -     Ambulatory referral to Psychology  Bipolar 1 disorder [facility]) -     Ambulatory referral to Psychology -     Sertraline  HCl; Take 1 tablet (50 mg total) by mouth daily.  Dispense: 90 tablet; Refill: 4 -     ARIPiprazole ; Take 1 tablet (5 mg total) by mouth daily.  Dispense: 90 tablet; Refill: 4  Anxiety -     Ambulatory referral to Psychology  Onychomycosis -     Terbinafine HCl; Take 1 tablet (250 mg total) by mouth daily. For toenail fungal  infection  Dispense: 84 tablet; Refill: 0 -     Ciclopirox ; Apply topically at bedtime. Apply over nail and surrounding skin. Apply daily over previous coat. After seven (7) days, may remove with alcohol and continue cycle.  Dispense: 6.6 mL; Refill: 2

## 2024-08-24 NOTE — Assessment & Plan Note (Signed)
 Bipolar disorder with comorbid depression and anxiety is managed with Abilify  and Zoloft . Increased anxiety and depression due to family stressors led to resumption of these medications. Risk of losing Medicaid could affect access to Abilify , a costly medication without insurance. Prescribe Abilify  5 mg daily and Zoloft  50 mg daily for 1 year. Refer to a therapist while Medicaid is active. Advise maintaining Medicaid coverage to ensure continued access to medications. Instruct to contact the office if Medicaid coverage is lost.

## 2024-08-24 NOTE — Assessment & Plan Note (Signed)
 Opioid dependence in remission on maintenance therapy   Opioid dependence is in remission with Suboxone , and he is maintaining sobriety. There is concern about potential loss of Medicaid due to income changes, which could impact treatment access. Prescribe Suboxone  8-2 mg sublingual daily for 3 months. Advise maintaining income below the Medicaid threshold to retain coverage. Instruct to contact the office immediately if Medicaid coverage is lost. Schedule follow-up in 3 months.  Challenging situation where recovery and excellent work performance lead to loss of insurance needed for treatment(s) is likely to happen.  This often results in relapse / decompensation if not carefully navigated.  Discussed in detail  Opioid dependence remains in remission with Suboxone . He reports sobriety and adherence to medication. Continue Suboxone  as prescribed. Schedule a 60-day follow-up, preferably via video. Continue(s) with pillcounts for diversion prevention PDMP reviewed during this encounter.

## 2024-10-18 ENCOUNTER — Encounter: Payer: Self-pay | Admitting: Internal Medicine

## 2024-12-04 ENCOUNTER — Ambulatory Visit: Admitting: Internal Medicine

## 2024-12-15 ENCOUNTER — Ambulatory Visit: Admitting: Internal Medicine
